# Patient Record
Sex: Male | Born: 1950 | Race: Black or African American | Hispanic: No | Smoking: Former smoker
Health system: Southern US, Community
[De-identification: ages and names within clinical notes are randomized; demographics above are authoritative.]

## PROBLEM LIST (undated history)

## (undated) DIAGNOSIS — Z923 Personal history of irradiation: Secondary | ICD-10-CM

## (undated) DIAGNOSIS — R202 Paresthesia of skin: Secondary | ICD-10-CM

## (undated) DIAGNOSIS — F1910 Other psychoactive substance abuse, uncomplicated: Secondary | ICD-10-CM

## (undated) DIAGNOSIS — K635 Polyp of colon: Secondary | ICD-10-CM

## (undated) DIAGNOSIS — R200 Anesthesia of skin: Secondary | ICD-10-CM

## (undated) DIAGNOSIS — M1990 Unspecified osteoarthritis, unspecified site: Secondary | ICD-10-CM

## (undated) DIAGNOSIS — T7840XA Allergy, unspecified, initial encounter: Secondary | ICD-10-CM

## (undated) DIAGNOSIS — K625 Hemorrhage of anus and rectum: Secondary | ICD-10-CM

## (undated) DIAGNOSIS — K219 Gastro-esophageal reflux disease without esophagitis: Secondary | ICD-10-CM

## (undated) DIAGNOSIS — C801 Malignant (primary) neoplasm, unspecified: Secondary | ICD-10-CM

## (undated) DIAGNOSIS — Z970 Presence of artificial eye: Secondary | ICD-10-CM

## (undated) HISTORY — DX: Polyp of colon: K63.5

## (undated) HISTORY — PX: EYE SURGERY: SHX253

## (undated) HISTORY — PX: COLONOSCOPY: SHX174

## (undated) HISTORY — DX: Paresthesia of skin: R20.2

## (undated) HISTORY — DX: Presence of artificial eye: Z97.0

## (undated) HISTORY — DX: Hemorrhage of anus and rectum: K62.5

## (undated) HISTORY — DX: Unspecified osteoarthritis, unspecified site: M19.90

## (undated) HISTORY — PX: COLONOSCOPY W/ POLYPECTOMY: SHX1380

## (undated) HISTORY — DX: Other psychoactive substance abuse, uncomplicated: F19.10

## (undated) HISTORY — DX: Anesthesia of skin: R20.0

## (undated) HISTORY — PX: POLYPECTOMY: SHX149

## (undated) HISTORY — DX: Gastro-esophageal reflux disease without esophagitis: K21.9

## (undated) HISTORY — DX: Allergy, unspecified, initial encounter: T78.40XA

---

## 2000-06-02 ENCOUNTER — Emergency Department (HOSPITAL_COMMUNITY): Admission: EM | Admit: 2000-06-02 | Discharge: 2000-06-02 | Payer: Self-pay | Admitting: Emergency Medicine

## 2001-03-28 ENCOUNTER — Emergency Department (HOSPITAL_COMMUNITY): Admission: EM | Admit: 2001-03-28 | Discharge: 2001-03-28 | Payer: Self-pay | Admitting: Emergency Medicine

## 2001-05-14 ENCOUNTER — Encounter: Admission: RE | Admit: 2001-05-14 | Discharge: 2001-05-14 | Payer: Self-pay | Admitting: Internal Medicine

## 2001-05-25 ENCOUNTER — Encounter (INDEPENDENT_AMBULATORY_CARE_PROVIDER_SITE_OTHER): Payer: Self-pay | Admitting: Internal Medicine

## 2001-06-15 ENCOUNTER — Encounter: Admission: RE | Admit: 2001-06-15 | Discharge: 2001-06-15 | Payer: Self-pay | Admitting: Internal Medicine

## 2003-05-23 ENCOUNTER — Encounter: Admission: RE | Admit: 2003-05-23 | Discharge: 2003-05-23 | Payer: Self-pay | Admitting: Internal Medicine

## 2003-05-28 ENCOUNTER — Encounter: Admission: RE | Admit: 2003-05-28 | Discharge: 2003-05-28 | Payer: Self-pay | Admitting: Internal Medicine

## 2003-07-10 ENCOUNTER — Encounter: Payer: Self-pay | Admitting: Gastroenterology

## 2003-08-13 ENCOUNTER — Encounter: Admission: RE | Admit: 2003-08-13 | Discharge: 2003-08-13 | Payer: Self-pay | Admitting: Internal Medicine

## 2004-12-30 ENCOUNTER — Ambulatory Visit: Payer: Self-pay | Admitting: Internal Medicine

## 2005-08-02 ENCOUNTER — Ambulatory Visit: Payer: Self-pay | Admitting: Internal Medicine

## 2005-08-04 ENCOUNTER — Ambulatory Visit: Payer: Self-pay | Admitting: Internal Medicine

## 2005-09-05 ENCOUNTER — Ambulatory Visit: Payer: Self-pay | Admitting: Gastroenterology

## 2005-09-07 ENCOUNTER — Ambulatory Visit: Payer: Self-pay | Admitting: Gastroenterology

## 2005-09-07 ENCOUNTER — Encounter (INDEPENDENT_AMBULATORY_CARE_PROVIDER_SITE_OTHER): Payer: Self-pay | Admitting: *Deleted

## 2006-03-13 DIAGNOSIS — F17200 Nicotine dependence, unspecified, uncomplicated: Secondary | ICD-10-CM

## 2006-03-13 DIAGNOSIS — K625 Hemorrhage of anus and rectum: Secondary | ICD-10-CM

## 2006-05-15 DIAGNOSIS — Z860100 Personal history of colon polyps, unspecified: Secondary | ICD-10-CM | POA: Insufficient documentation

## 2006-10-31 ENCOUNTER — Telehealth: Payer: Self-pay | Admitting: *Deleted

## 2006-11-02 ENCOUNTER — Ambulatory Visit: Payer: Self-pay | Admitting: Internal Medicine

## 2006-11-02 ENCOUNTER — Ambulatory Visit (HOSPITAL_COMMUNITY): Admission: RE | Admit: 2006-11-02 | Discharge: 2006-11-02 | Payer: Self-pay | Admitting: *Deleted

## 2006-11-21 ENCOUNTER — Telehealth (INDEPENDENT_AMBULATORY_CARE_PROVIDER_SITE_OTHER): Payer: Self-pay | Admitting: Internal Medicine

## 2008-02-25 ENCOUNTER — Ambulatory Visit: Payer: Self-pay | Admitting: Internal Medicine

## 2008-02-27 ENCOUNTER — Encounter (INDEPENDENT_AMBULATORY_CARE_PROVIDER_SITE_OTHER): Payer: Self-pay | Admitting: Internal Medicine

## 2008-02-27 LAB — CONVERTED CEMR LAB
ALT: 26 units/L (ref 0–53)
AST: 25 units/L (ref 0–37)
Albumin: 4.3 g/dL (ref 3.5–5.2)
Alkaline Phosphatase: 60 units/L (ref 39–117)
BUN: 9 mg/dL (ref 6–23)
Basophils Absolute: 0 10*3/uL (ref 0.0–0.1)
Basophils Relative: 1 % (ref 0–1)
CO2: 25 meq/L (ref 19–32)
Calcium: 8.9 mg/dL (ref 8.4–10.5)
Chloride: 105 meq/L (ref 96–112)
Cholesterol: 111 mg/dL (ref 0–200)
Creatinine, Ser: 0.89 mg/dL (ref 0.40–1.50)
Eosinophils Absolute: 0.1 10*3/uL (ref 0.0–0.7)
Eosinophils Relative: 2 % (ref 0–5)
Glucose, Bld: 99 mg/dL (ref 70–99)
HCT: 43.8 % (ref 39.0–52.0)
HDL: 34 mg/dL — ABNORMAL LOW (ref >39)
Hemoglobin: 14.5 g/dL (ref 13.0–17.0)
LDL Cholesterol: 36 mg/dL (ref 0–99)
Lymphocytes Relative: 52 % — ABNORMAL HIGH (ref 12–46)
Lymphs Abs: 1.9 10*3/uL (ref 0.7–4.0)
MCHC: 33.1 g/dL (ref 30.0–36.0)
MCV: 96.9 fL (ref 78.0–100.0)
Monocytes Absolute: 0.4 10*3/uL (ref 0.1–1.0)
Monocytes Relative: 11 % (ref 3–12)
Neutro Abs: 1.2 10*3/uL — ABNORMAL LOW (ref 1.7–7.7)
Neutrophils Relative %: 34 % — ABNORMAL LOW (ref 43–77)
Platelets: 226 10*3/uL (ref 150–400)
Potassium: 4.1 meq/L (ref 3.5–5.3)
RBC: 4.52 M/uL (ref 4.22–5.81)
RDW: 13 % (ref 11.5–15.5)
Sodium: 139 meq/L (ref 135–145)
TSH: 1.164 microintl units/mL (ref 0.350–4.50)
Total Bilirubin: 0.9 mg/dL (ref 0.3–1.2)
Total CHOL/HDL Ratio: 3.3
Total Protein: 7.1 g/dL (ref 6.0–8.3)
Triglycerides: 203 mg/dL — ABNORMAL HIGH (ref <150)
VLDL: 41 mg/dL — ABNORMAL HIGH (ref 0–40)
Vitamin B-12: 332 pg/mL (ref 211–911)
WBC: 3.6 10*3/uL — ABNORMAL LOW (ref 4.0–10.5)

## 2008-03-14 ENCOUNTER — Telehealth: Payer: Self-pay | Admitting: *Deleted

## 2008-03-18 ENCOUNTER — Ambulatory Visit: Payer: Self-pay | Admitting: Internal Medicine

## 2008-03-18 LAB — CONVERTED CEMR LAB
OCCULT 1: POSITIVE
OCCULT 2: NEGATIVE
OCCULT 3: NEGATIVE

## 2008-04-03 ENCOUNTER — Ambulatory Visit: Payer: Self-pay | Admitting: Gastroenterology

## 2008-05-09 ENCOUNTER — Ambulatory Visit: Payer: Self-pay | Admitting: Gastroenterology

## 2008-06-18 ENCOUNTER — Ambulatory Visit: Payer: Self-pay | Admitting: Gastroenterology

## 2008-07-04 ENCOUNTER — Ambulatory Visit: Payer: Self-pay | Admitting: Gastroenterology

## 2008-07-04 ENCOUNTER — Encounter: Payer: Self-pay | Admitting: Gastroenterology

## 2008-07-04 LAB — HM COLONOSCOPY

## 2008-07-15 ENCOUNTER — Encounter: Payer: Self-pay | Admitting: Gastroenterology

## 2009-08-11 ENCOUNTER — Ambulatory Visit: Payer: Self-pay | Admitting: Internal Medicine

## 2009-08-11 DIAGNOSIS — J329 Chronic sinusitis, unspecified: Secondary | ICD-10-CM | POA: Insufficient documentation

## 2009-08-11 LAB — CONVERTED CEMR LAB
ALT: 21 units/L (ref 0–53)
AST: 23 units/L (ref 0–37)
Albumin: 4.4 g/dL (ref 3.5–5.2)
Alkaline Phosphatase: 72 units/L (ref 39–117)
BUN: 7 mg/dL (ref 6–23)
CO2: 28 meq/L (ref 19–32)
Calcium: 9.5 mg/dL (ref 8.4–10.5)
Chloride: 106 meq/L (ref 96–112)
Creatinine, Ser: 0.8 mg/dL (ref 0.40–1.50)
Glucose, Bld: 93 mg/dL (ref 70–99)
HCT: 42 % (ref 39.0–52.0)
Hemoglobin: 14.1 g/dL (ref 13.0–17.0)
MCHC: 33.6 g/dL (ref 30.0–36.0)
MCV: 95.9 fL (ref >=78.0)
PSA: 0.45 ng/mL (ref 0.10–4.00)
Platelets: 249 10*3/uL (ref 150–400)
Potassium: 4.3 meq/L (ref 3.5–5.3)
RBC: 4.38 M/uL (ref 4.22–5.81)
RDW: 13.2 % (ref 11.5–15.5)
Sodium: 141 meq/L (ref 135–145)
Total Bilirubin: 0.4 mg/dL (ref 0.3–1.2)
Total Protein: 7 g/dL (ref 6.0–8.3)
WBC: 5.1 10*3/uL (ref 4.0–10.5)

## 2009-08-12 ENCOUNTER — Telehealth: Payer: Self-pay | Admitting: *Deleted

## 2009-08-18 ENCOUNTER — Ambulatory Visit (HOSPITAL_COMMUNITY): Admission: RE | Admit: 2009-08-18 | Discharge: 2009-08-18 | Payer: Self-pay | Admitting: Internal Medicine

## 2009-09-04 ENCOUNTER — Ambulatory Visit: Payer: Self-pay | Admitting: Infectious Disease

## 2009-09-07 ENCOUNTER — Encounter: Payer: Self-pay | Admitting: Internal Medicine

## 2009-09-16 ENCOUNTER — Encounter: Payer: Self-pay | Admitting: Internal Medicine

## 2009-09-25 ENCOUNTER — Ambulatory Visit: Payer: Self-pay | Admitting: Infectious Disease

## 2009-09-25 DIAGNOSIS — R030 Elevated blood-pressure reading, without diagnosis of hypertension: Secondary | ICD-10-CM

## 2010-05-07 ENCOUNTER — Ambulatory Visit: Admission: RE | Admit: 2010-05-07 | Discharge: 2010-05-07 | Payer: Self-pay | Source: Home / Self Care

## 2010-05-07 DIAGNOSIS — J309 Allergic rhinitis, unspecified: Secondary | ICD-10-CM | POA: Insufficient documentation

## 2010-05-08 LAB — CONVERTED CEMR LAB
LDL Cholesterol: 56 mg/dL (ref 0–99)
Total CHOL/HDL Ratio: 3

## 2010-06-01 NOTE — Progress Notes (Signed)
  Phone Note Outgoing Call   Call placed by: Theotis Barrio NT II,  August 12, 2009 3:07 PM Call placed to: Patient Details for Reason: TO INFORM PATIENT ABOUT HAVING CXR Summary of Call: CALLED PATIENT AT [phone], THIS IS NOT A CORRECT PHONE NUMBER, THIS A RESTARAUNT. Marland Kitchen UNABLE TO CONTACT PATIENT BY THIS PHONE NUMBER. Lela Sturdivant NT II  August 12, 2009 3:09 PM
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
## 2010-06-01 NOTE — Miscellaneous (Signed)
Summary: [facility]   Imported By: Margie Billet 10/05/2009 11:47:32  _____________________________________________________________________  External Attachment:    Type:   Image     Comment:   External Document

## 2010-06-01 NOTE — Progress Notes (Signed)
  Phone Note Outgoing Call   Call placed by: Theotis Barrio NT II,  August 12, 2009 4:22 PM Call placed to: Patient Details for Reason: CHEST X-RAY REFERRAL Summary of Call: CALLED MR Yeates AT [phone]([facility] HAD THIS NUMBER IN HIS NOTES) CALLED PATIENT , NO ANSWER.  PUTTING XRAY ORDERS IN THE MAIL / BUT I HAVE ORGINAL ORDER. Delores Thelen NT II  August 12, 2009 4:23 PM

## 2010-06-01 NOTE — Assessment & Plan Note (Signed)
Summary: FU VISIT/DS   Vital Signs:  Patient profile:   60 year old male Height:      66 inches (167.64 cm) Weight:      138.7 pounds (63.05 kg) BMI:     22.47 Temp:     97.5 degrees F (36.39 degrees C) oral Pulse rate:   73 / minute BP sitting:   146 / 83  (right arm)  Vitals Entered By: Stanton Kidney Ditzler RN (Sep 25, 2009 3:43 PM) CC: Depression Is Patient Diabetic? No Pain Assessment Patient in pain? no      Nutritional Status BMI of 19 -24 = normal Nutritional Status Detail appetite good  Have you ever been in a relationship where you felt threatened, hurt or afraid?denies   Does patient need assistance? Functional Status Self care Ambulation Normal Comments FU - better.   Primary Care Provider:  Chauncey Reading DO  CC:  Depression.  History of Present Illness: 58yom with pmh outlined in the chart who is here for follow up.  He has no complaints today.  His cough is improved.  He still has a little scratchyness in the back of the throat, but it is also improved.  No fevers or chills.  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  Depression History:      The patient denies a depressed mood most of the day and a diminished interest in his usual daily activities.         Preventive Screening-Counseling & Management  Alcohol-Tobacco     Smoking Status: quit     Packs/Day: <1/2     Year Quit:    2008  Caffeine-Diet-Exercise     Caffeine use/day: 4     Does Patient Exercise: yes     Type of exercise:   BIKE      Times/week:   5  Current Medications (verified): 1)  Flonase 50 Mcg/act Susp (Fluticasone Propionate) .... One Puff in Each Nostril. 2)  Ventolin Hfa 108 (90 Base) Mcg/act Aers (Albuterol Sulfate) .... Inhale 2 Puff Every 4 Hour As Needed For Shortness of Breath and Cough. 3)  Qvar 40 Mcg/act Aers (Beclomethasone Dipropionate)  .Marland Kitchen.. 1 Puff Twice A Day.  Allergies (verified): No Known Drug Allergies  Review of Systems       per hpi  Physical Exam  General:  alert and well-developed.   Mouth:  pharynx pink and moist.   Lungs:  normal respiratory effort and normal breath sounds.  there is a fine wheeze at end inspiration. Heart:  normal rate, regular rhythm, no murmur, and no gallop.   Extremities:  no edema Neurologic:  alert & oriented X3, cranial nerves II-XII intact, and gait normal.     Impression & Recommendations:  Problem # 1:  COUGH (ICD-786.2) Improved with prednisone taper, will see allergist again next week.  Trying to move out of his appartment that has mold and mildew.  Problem # 2:  HYPERTRIGLYCERIDEMIA (ICD-272.1) repeat this test at next appointment.  Labs Reviewed: SGOT: 23 (08/11/2009)   SGPT: 21 (08/11/2009)   HDL:34 (02/25/2008)  LDL:36 (02/25/2008)  Chol:111 (02/25/2008)  Trig:203 (02/25/2008)  Problem # 3:  TOBACCO ABUSE (ICD-305.1) has quit smoking.  Problem # 4:  GERD (ICD-530.81) no associated complaints today.  Problem # 5:  ELEVATED BLOOD PRESSURE WITHOUT DIAGNOSIS OF HYPERTENSION (ICD-796.2) has  had two measurements >140. he will rtc in 3 months for recheck.  If at that time stil >140 than would give dx HTN and treat. Today discussed salt reduction and exercise.  He will try these and see if he can bring BP down.  Complete Medication List: 1)  Flonase 50 Mcg/act Susp (Fluticasone propionate) .... One puff in each nostril. 2)  Ventolin Hfa 108 (90 Base) Mcg/act Aers (Albuterol sulfate) .... Inhale 2 puff every 4 hour as needed for shortness of breath and cough. 3)  Qvar 40 Mcg/act Aers (Beclomethasone dipropionate) .Marland Kitchen.. 1 puff twice a day.  Patient Instructions: 1)  Please schedule a follow-up appointment in 3 months. 2)  You should exercise and cut down on the salt in your diet to decrease your blood pressure.   Prevention & Chronic Care Immunizations    Influenza vaccine: Fluvax 3+  (02/25/2008)   Influenza vaccine deferral: Deferred  (08/11/2009)    Tetanus booster: Not documented    Pneumococcal vaccine: Not documented  Colorectal Screening   Hemoccult: Not documented    Colonoscopy: Results: Polyp.  Pathology:  Adenomatous polyp.        Location:  [facility].    (07/04/2008)   Colonoscopy action/deferral: Repeat colonoscopy in 3 years.     (07/04/2008)   Colonoscopy due: 07/2011  Other Screening   PSA: 0.45  (08/11/2009)   PSA action/deferral: Discussed-PSA requested  (08/11/2009)   Smoking status: quit  (09/25/2009)  Lipids   Total Cholesterol: 111  (02/25/2008)   LDL: 36  (02/25/2008)   LDL Direct: Not documented   HDL: 34  (02/25/2008)   Triglycerides: 203  (02/25/2008)    SGOT (AST): 23  (08/11/2009)   SGPT (ALT): 21  (08/11/2009)   Alkaline phosphatase: 72  (08/11/2009)   Total bilirubin: 0.4  (08/11/2009)    Lipid flowsheet reviewed?: Yes   Progress toward LDL goal: Unchanged  Self-Management Support :   Personal Goals (by the next clinic visit) :      Personal LDL goal: 100  (08/11/2009)    Lipid self-management support: Written self-care plan, Education handout, Resources for patients handout  (09/04/2009)

## 2010-06-01 NOTE — Letter (Signed)
Summary: [facility]  [facility]   Imported By: Margie Billet 09/17/2009 11:02:14  _____________________________________________________________________  External Attachment:    Type:   Image     Comment:   External Document
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
## 2010-07-30 ENCOUNTER — Encounter: Payer: Self-pay | Admitting: Internal Medicine

## 2010-10-08 ENCOUNTER — Encounter: Payer: Self-pay | Admitting: Internal Medicine

## 2010-10-08 ENCOUNTER — Ambulatory Visit (INDEPENDENT_AMBULATORY_CARE_PROVIDER_SITE_OTHER): Payer: Medicare Other | Admitting: Internal Medicine

## 2010-10-08 DIAGNOSIS — N400 Enlarged prostate without lower urinary tract symptoms: Secondary | ICD-10-CM

## 2010-10-08 DIAGNOSIS — J309 Allergic rhinitis, unspecified: Secondary | ICD-10-CM

## 2010-10-08 DIAGNOSIS — Z125 Encounter for screening for malignant neoplasm of prostate: Secondary | ICD-10-CM

## 2010-10-08 DIAGNOSIS — R030 Elevated blood-pressure reading, without diagnosis of hypertension: Secondary | ICD-10-CM

## 2010-10-08 DIAGNOSIS — F329 Major depressive disorder, single episode, unspecified: Secondary | ICD-10-CM | POA: Insufficient documentation

## 2010-10-08 NOTE — Progress Notes (Signed)
  Subjective:    Patient ID: Raymond Gregory, male    DOB: 03-Jul-1950, 60 y.o.   MRN: [id number]  HPI [name] is here for follow up. He has no particular complain. He does endorse depression. When asked to eloborate, he tells me he kind of feels sad, likes to lay in bed. His wife with whom he had separated wants to join him after 3-4 years of on and off relationship. He does not have particular stressor. He likes to watch basketball but does not do anything else. He denies suicidal ideations. He eats fine. And has not lost any weight.    Review of Systems  Constitutional: Negative for fever, chills, activity change and appetite change.  HENT: Negative for nosebleeds, facial swelling, neck pain and tinnitus.   Eyes: Negative for pain, discharge and visual disturbance.  Respiratory: Negative for cough, chest tightness and shortness of breath.   Cardiovascular: Negative for chest pain and palpitations.  Gastrointestinal: Negative for nausea, vomiting, abdominal pain, blood in stool and abdominal distention.  Skin: Negative for rash.  Neurological: Negative for dizziness, seizures, weakness and headaches.  Psychiatric/Behavioral: Negative for suicidal ideas, confusion and agitation.       Objective:   Physical Exam  Constitutional: He is oriented to person, place, and time. He appears well-developed and well-nourished.  HENT:  Head: Normocephalic and atraumatic.  Right Ear: External ear normal.  Left Ear: External ear normal.  Eyes: Conjunctivae and EOM are normal. Pupils are equal, round, and reactive to light. Right eye exhibits no discharge. Left eye exhibits no discharge.  Neck: Normal range of motion. Neck supple. No thyromegaly present.  Cardiovascular: Normal rate and regular rhythm.   No murmur heard. Pulmonary/Chest: Effort normal and breath sounds normal. No respiratory distress. He has no wheezes. He has no rales.  Abdominal: Soft. Bowel sounds are normal. He exhibits no  distension and no mass. There is no tenderness. There is no rebound and no guarding.  Musculoskeletal: Normal range of motion.  Neurological: He is alert and oriented to person, place, and time. He has normal reflexes. No cranial nerve deficit. Coordination normal.  Skin: No rash noted. He is not diaphoretic. No erythema.  Psychiatric: He has a normal mood and affect. His behavior is normal. Judgment and thought content normal.          Assessment & Plan:

## 2010-10-08 NOTE — Patient Instructions (Signed)
Return in 3 months.

## 2010-10-08 NOTE — Assessment & Plan Note (Signed)
Minimal symptoms, patient is concerned for prostate cancer. Will screen with PSA. DRE not impressive.

## 2010-10-08 NOTE — Assessment & Plan Note (Signed)
Controlled symptoms

## 2010-10-08 NOTE — Assessment & Plan Note (Signed)
Encouraged daily physical activity, socialization and hobby development. He is thinking of joining the [employer]. He does not want medication right now.

## 2010-10-08 NOTE — Assessment & Plan Note (Signed)
BP normal today. He has stayed borderline high but does not have any other risk factors. He is not obese, no diabetes or cardiac history. He does smoke. At this time, will not start any therapy.

## 2010-10-09 LAB — PSA: PSA: 0.48 ng/mL (ref <=4.00)

## 2011-03-07 ENCOUNTER — Encounter: Payer: Self-pay | Admitting: Internal Medicine

## 2011-03-07 ENCOUNTER — Ambulatory Visit (INDEPENDENT_AMBULATORY_CARE_PROVIDER_SITE_OTHER): Payer: Medicare Other | Admitting: Internal Medicine

## 2011-03-07 VITALS — BP 139/80 | HR 70 | Temp 98.3°F | Ht 66.0 in | Wt 140.9 lb

## 2011-03-07 DIAGNOSIS — Z23 Encounter for immunization: Secondary | ICD-10-CM

## 2011-03-07 DIAGNOSIS — F17200 Nicotine dependence, unspecified, uncomplicated: Secondary | ICD-10-CM

## 2011-03-07 DIAGNOSIS — Z0000 Encounter for general adult medical examination without abnormal findings: Secondary | ICD-10-CM | POA: Insufficient documentation

## 2011-03-07 DIAGNOSIS — Z86010 Personal history of colonic polyps: Secondary | ICD-10-CM

## 2011-03-07 DIAGNOSIS — J309 Allergic rhinitis, unspecified: Secondary | ICD-10-CM

## 2011-03-07 DIAGNOSIS — F329 Major depressive disorder, single episode, unspecified: Secondary | ICD-10-CM

## 2011-03-07 DIAGNOSIS — R030 Elevated blood-pressure reading, without diagnosis of hypertension: Secondary | ICD-10-CM

## 2011-03-07 MED ORDER — LORATADINE 10 MG PO TABS: 10.0000 mg | ORAL_TABLET | Freq: Every day | ORAL | Status: DC | PRN

## 2011-03-07 NOTE — Assessment & Plan Note (Signed)
BP just barely within normal range today.  Will continue to monitor.

## 2011-03-07 NOTE — Assessment & Plan Note (Signed)
Patient not willing to commit to quitting entirely at this time.  I explained that we would be very happy to help him when he is ready, and instructed him to call clinic if he decides he wants help at a later date.

## 2011-03-07 NOTE — Assessment & Plan Note (Signed)
Patient enjoys walking his dog and other things in life.  Says that his mood has generally been very good.

## 2011-03-07 NOTE — Patient Instructions (Signed)
Please return to clinic in 1 year

## 2011-03-07 NOTE — Assessment & Plan Note (Signed)
Referred him to GI for colonoscopy to take place in 6 months (when it would come due)

## 2011-03-07 NOTE — Assessment & Plan Note (Signed)
Explained that Flonase is not meant to be used PRN.  He does not seem to need a maintenance medicine at this time.  Discontinued Flonase and prescribed Claritin daily PRN for allergic rhinitis.

## 2011-03-07 NOTE — Assessment & Plan Note (Addendum)
Flu shot to be given today.  Patient to follow-up in 1 year for a check-up.  Instructed to call us if he wants to come in sooner.
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
## 2011-05-27 ENCOUNTER — Encounter: Payer: Self-pay | Admitting: Gastroenterology

## 2011-06-20 ENCOUNTER — Encounter: Payer: Self-pay | Admitting: Gastroenterology

## 2011-06-27 ENCOUNTER — Ambulatory Visit (AMBULATORY_SURGERY_CENTER): Payer: Medicare Other | Admitting: *Deleted

## 2011-06-27 DIAGNOSIS — Z86010 Personal history of colonic polyps: Secondary | ICD-10-CM

## 2011-06-27 DIAGNOSIS — Z1211 Encounter for screening for malignant neoplasm of colon: Secondary | ICD-10-CM

## 2011-06-27 MED ORDER — PEG-KCL-NACL-NASULF-NA ASC-C 100 G PO SOLR: ORAL | Status: DC

## 2011-06-27 NOTE — Progress Notes (Signed)
No allergy to eggs or soy products 

## 2011-07-11 ENCOUNTER — Encounter: Payer: Self-pay | Admitting: Gastroenterology

## 2011-07-11 ENCOUNTER — Ambulatory Visit (AMBULATORY_SURGERY_CENTER): Payer: Medicare Other | Admitting: Gastroenterology

## 2011-07-11 ENCOUNTER — Telehealth: Payer: Self-pay | Admitting: Gastroenterology

## 2011-07-11 ENCOUNTER — Other Ambulatory Visit: Payer: Self-pay | Admitting: Gastroenterology

## 2011-07-11 VITALS — BP 135/91 | HR 90 | Temp 95.5°F | Resp 19 | Ht 65.0 in | Wt 143.0 lb

## 2011-07-11 DIAGNOSIS — Z1211 Encounter for screening for malignant neoplasm of colon: Secondary | ICD-10-CM

## 2011-07-11 DIAGNOSIS — Z86010 Personal history of colonic polyps: Secondary | ICD-10-CM

## 2011-07-11 DIAGNOSIS — D126 Benign neoplasm of colon, unspecified: Secondary | ICD-10-CM

## 2011-07-11 MED ORDER — SODIUM CHLORIDE 0.9 % IV SOLN: 500.0000 mL | INTRAVENOUS | Status: DC

## 2011-07-11 NOTE — Patient Instructions (Signed)
YOU HAD AN ENDOSCOPIC PROCEDURE TODAY AT THE [facility]: Refer to the procedure report that was given to you for any specific questions about what was found during the examination.  If the procedure report does not answer your questions, please call your gastroenterologist to clarify.  If you requested that your care partner not be given the details of your procedure findings, then the procedure report has been included in a sealed envelope for you to review at your convenience later.  YOU SHOULD EXPECT: Some feelings of bloating in the abdomen. Passage of more gas than usual.  Walking can help get rid of the air that was put into your GI tract during the procedure and reduce the bloating. If you had a lower endoscopy (such as a colonoscopy or flexible sigmoidoscopy) you may notice spotting of blood in your stool or on the toilet paper. If you underwent a bowel prep for your procedure, then you may not have a normal bowel movement for a few days.  DIET: Your first meal following the procedure should be a light meal and then it is ok to progress to your normal diet.  A half-sandwich or bowl of soup is an example of a good first meal.  Heavy or fried foods are harder to digest and may make you feel nauseous or bloated.  Likewise meals heavy in dairy and vegetables can cause extra gas to form and this can also increase the bloating.  Drink plenty of fluids but you should avoid alcoholic beverages for 24 hours.  ACTIVITY: Your care partner should take you home directly after the procedure.  You should plan to take it easy, moving slowly for the rest of the day.  You can resume normal activity the day after the procedure however you should NOT DRIVE or use heavy machinery for 24 hours (because of the sedation medicines used during the test).    SYMPTOMS TO REPORT IMMEDIATELY: A gastroenterologist can be reached at any hour.  During normal business hours, 8:30 AM to 5:00 PM Monday through Friday,  call [phone].  After hours and on weekends, please call the GI answering service at [phone] who will take a message and have the physician on call contact you.   Following lower endoscopy (colonoscopy or flexible sigmoidoscopy):  Excessive amounts of blood in the stool  Significant tenderness or worsening of abdominal pains  Swelling of the abdomen that is new, acute  Fever of 100F or higher  Black, tarry-looking stools  FOLLOW UP: If any biopsies were taken you will be contacted by phone or by letter within the next 1-3 weeks.  Call your gastroenterologist if you have not heard about the biopsies in 3 weeks.  Our staff will call the home number listed on your records the next business day following your procedure to check on you and address any questions or concerns that you may have at that time regarding the information given to you following your procedure. This is a courtesy call and so if there is no answer at the home number and we have not heard from you through the emergency physician on call, we will assume that you have returned to your regular daily activities without incident.  SIGNATURES/CONFIDENTIALITY: You and/or your care partner have signed paperwork which will be entered into your electronic medical record.  These signatures attest to the fact that that the information above on your After Visit Summary has been reviewed and is understood.  Full responsibility of   the confidentiality of this discharge information lies with you and/or your care-partner.  

## 2011-07-11 NOTE — Telephone Encounter (Signed)
Patient called and said he did not have his packet B with his second dose.  Asked Coralie Keens, RN what he should do and she asked Dr. Arlyce Dice.  He advised patient to go purchase a bottle of Mag. Citrate over the counter and drink it with plenty of water.  Patient was advised and agreed to do so.

## 2011-07-11 NOTE — Progress Notes (Signed)
Patient did not experience any of the following events: a burn prior to discharge; a fall within the facility; wrong site/side/patient/procedure/implant event; or a hospital transfer or hospital admission upon discharge from the facility. (G8907) Patient did not have preoperative order for IV antibiotic SSI prophylaxis. (G8918)  

## 2011-07-11 NOTE — Op Note (Signed)
[facility] [address] [place], [place]  [address]  COLONOSCOPY PROCEDURE REPORT  PATIENT:  Raymond Gregory, Raymond Gregory  MR#:  [id number] BIRTHDATE:  [birth year], 60 yrs. old  GENDER:  male ENDOSCOPIST:  Barbette Hair. Arlyce Dice, MD REF. BY: PROCEDURE DATE:  07/11/2011 PROCEDURE:  Colonoscopy with snare polypectomy ASA CLASS:  Class II INDICATIONS:  Screening, history of pre-cancerous (adenomatous) colon polyps colon polyps 2005, 2010 MEDICATIONS:   MAC sedation, administered by CRNA propofol 250mg IV  DESCRIPTION OF PROCEDURE:   After the risks benefits and alternatives of the procedure were thoroughly explained, informed consent was obtained.  Digital rectal exam was performed and revealed no abnormalities.   The LB 180AL [id number] endoscope was introduced through the anus and advanced to the cecum, which was identified by both the appendix and ileocecal valve, without limitations.  The quality of the prep was excellent, using MoviPrep.  The instrument was then slowly withdrawn as the colon was fully examined. <<PROCEDUREIMAGES>>  FINDINGS:  A sessile polyp was found at the hepatic flexure. It was 3 mm in size. Polyp was snared without cautery. Retrieval was unsuccessful (see image4). snare polyp  A sessile polyp was found in the mid transverse colon. It was 5 mm in size. Polyp was snared without cautery. Retrieval was successful (see image7). snare polyp  A sessile polyp was found in the proximal transverse colon. It was 3 mm in size. Polyp was snared without cautery. Retrieval was successful (see image5). snare polyp   Retroflexed views in the rectum revealed no abnormalities.    The time to cecum =  1) 4.0  minutes. The scope was then withdrawn in  1) 11.50  minutes from the cecum and the procedure completed. COMPLICATIONS:  None ENDOSCOPIC IMPRESSION: 1) 3 mm sessile polyp at the hepatic flexure 2) 5 mm sessile polyp in the mid transverse colon 3) 3 mm sessile polyp in the proximal  transverse colon RECOMMENDATIONS: 1) If the polyp(s) removed today are proven to be adenomatous (pre-cancerous) polyps, you will need a repeat colonoscopy in 5 years. Otherwise you should continue to follow colorectal cancer screening guidelines for "routine risk" patients with colonoscopy in 10 years. You will receive a letter within 1-2 weeks with the results of your biopsy as well as final recommendations. Please call my office if you have not received a letter after 3 weeks. REPEAT EXAM:  You will receive a letter from Dr. Arlyce Dice in 1-2 weeks, after reviewing the final pathology, with followup recommendations.  ______________________________ Barbette Hair Arlyce Dice, MD  CC:  Thomas Hoff MD  n. Rosalie DoctorBarbette Hair. Maraya Gwilliam at 07/11/2011 03:35 PM  Elliot Cousin, [phone]

## 2011-07-12 ENCOUNTER — Telehealth: Payer: Self-pay | Admitting: *Deleted

## 2011-07-12 NOTE — Telephone Encounter (Signed)
  Follow up Call-  Call back number 07/11/2011  Post procedure Call Back phone  # [phone]  Permission to leave phone message Yes     Patient questions:  Do you have a fever, pain , or abdominal swelling? no Pain Score  0 *  Have you tolerated food without any problems? yes  Have you been able to return to your normal activities? yes  Do you have any questions about your discharge instructions: Diet   no Medications  no Follow up visit  no  Do you have questions or concerns about your Care? no  Actions: * If pain score is 4 or above: No action needed, pain <4.

## 2011-07-19 ENCOUNTER — Encounter: Payer: Self-pay | Admitting: Gastroenterology

## 2012-11-22 ENCOUNTER — Ambulatory Visit (HOSPITAL_COMMUNITY)
Admission: RE | Admit: 2012-11-22 | Discharge: 2012-11-22 | Disposition: A | Discharge status: Home / Self Care | Payer: Medicare Other | Source: Ambulatory Visit | Attending: Internal Medicine | Admitting: Internal Medicine

## 2012-11-22 ENCOUNTER — Encounter: Payer: Medicare Other | Admitting: Internal Medicine

## 2012-11-22 ENCOUNTER — Ambulatory Visit (INDEPENDENT_AMBULATORY_CARE_PROVIDER_SITE_OTHER): Payer: Medicare Other | Admitting: Internal Medicine

## 2012-11-22 ENCOUNTER — Encounter: Payer: Self-pay | Admitting: Internal Medicine

## 2012-11-22 VITALS — BP 151/100 | HR 72 | Temp 97.8°F | Ht 66.0 in | Wt 139.6 lb

## 2012-11-22 DIAGNOSIS — R631 Polydipsia: Secondary | ICD-10-CM

## 2012-11-22 DIAGNOSIS — R531 Weakness: Secondary | ICD-10-CM

## 2012-11-22 DIAGNOSIS — R55 Syncope and collapse: Secondary | ICD-10-CM | POA: Insufficient documentation

## 2012-11-22 DIAGNOSIS — R358 Other polyuria: Secondary | ICD-10-CM

## 2012-11-22 DIAGNOSIS — R030 Elevated blood-pressure reading, without diagnosis of hypertension: Secondary | ICD-10-CM

## 2012-11-22 DIAGNOSIS — I517 Cardiomegaly: Secondary | ICD-10-CM | POA: Insufficient documentation

## 2012-11-22 DIAGNOSIS — R5383 Other fatigue: Secondary | ICD-10-CM

## 2012-11-22 LAB — COMPLETE METABOLIC PANEL WITH GFR
ALT: 19 U/L (ref 0–53)
AST: 29 U/L (ref 0–37)
Alkaline Phosphatase: 129 U/L — ABNORMAL HIGH (ref 39–117)
CO2: 28 mEq/L (ref 19–32)
Creat: 0.69 mg/dL (ref 0.50–1.35)
Sodium: 139 mEq/L (ref 135–145)
Total Bilirubin: 0.6 mg/dL (ref 0.3–1.2)
Total Protein: 8.1 g/dL (ref 6.0–8.3)

## 2012-11-22 LAB — CBC WITH DIFFERENTIAL/PLATELET
Basophils Relative: 1 % (ref 0–1)
Eosinophils Absolute: 0.2 10*3/uL (ref 0.0–0.7)
Eosinophils Relative: 4 % (ref 0–5)
HCT: 45.1 % (ref 39.0–52.0)
Hemoglobin: 15.1 g/dL (ref 13.0–17.0)
Lymphs Abs: 2.3 10*3/uL (ref 0.7–4.0)
MCH: 32.1 pg (ref 26.0–34.0)
MCHC: 33.5 g/dL (ref 30.0–36.0)
MCV: 95.8 fL (ref 78.0–100.0)
Monocytes Absolute: 0.5 10*3/uL (ref 0.1–1.0)
Monocytes Relative: 9 % (ref 3–12)
Neutrophils Relative %: 43 % (ref 43–77)
RBC: 4.71 MIL/uL (ref 4.22–5.81)

## 2012-11-22 LAB — URINALYSIS, ROUTINE W REFLEX MICROSCOPIC
Glucose, UA: NEGATIVE mg/dL
Hgb urine dipstick: NEGATIVE
Leukocytes, UA: NEGATIVE
pH: 5.5 (ref 5.0–8.0)

## 2012-11-22 LAB — GLUCOSE, CAPILLARY: Glucose-Capillary: 98 mg/dL (ref 70–99)

## 2012-11-22 LAB — TSH: TSH: 1.036 u[IU]/mL (ref 0.350–4.500)

## 2012-11-22 NOTE — Assessment & Plan Note (Addendum)
BP Readings from Last 3 Encounters:  11/22/12 151/100  07/11/11 135/91  03/07/11 139/80    Lab Results  Component Value Date   NA 139 11/22/2012   K 4.2 11/22/2012   CREATININE 0.69 11/22/2012    Assessment: Blood pressure control:  <140/90 Comments: The patient likely has HTN.  Plan: Medications:  We elected to not start a BP medication today as he had recently had an episode of syncope that may have resulted from dehydration/orthostasis. Educational resources provided:   Self management tools provided:   Other plans: The patient is creating a two week BP log that he will bring to his next visit. At this time, a decision may be made regarding starting a BP medication.

## 2012-11-22 NOTE — Patient Instructions (Addendum)
Create Blood pressure and heart rate log (Note any times that you are dizzy)\  Complete ultrasound of the heart  Follow-up in 2 weeks with clinic to re-evaluate and consider starting BP medications.

## 2012-11-22 NOTE — Assessment & Plan Note (Addendum)
CBC    Component Value Date/Time   WBC 5.5 11/22/2012 1147   RBC 4.71 11/22/2012 1147   HGB 15.1 11/22/2012 1147   HCT 45.1 11/22/2012 1147   PLT 282 11/22/2012 1147   MCV 95.8 11/22/2012 1147   MCH 32.1 11/22/2012 1147   MCHC 33.5 11/22/2012 1147   RDW 13.3 11/22/2012 1147   LYMPHSABS 2.3 11/22/2012 1147   MONOABS 0.5 11/22/2012 1147   EOSABS 0.2 11/22/2012 1147   BASOSABS 0.1 11/22/2012 1147   CMP     Component Value Date/Time   NA 139 11/22/2012 1147   K 4.2 11/22/2012 1147   CL 101 11/22/2012 1147   CO2 28 11/22/2012 1147   GLUCOSE 95 11/22/2012 1147   BUN 10 11/22/2012 1147   CREATININE 0.69 11/22/2012 1147   CREATININE 0.80 08/11/2009 2038   CALCIUM 9.9 11/22/2012 1147   PROT 8.1 11/22/2012 1147   ALBUMIN 3.7 11/22/2012 1147   AST 29 11/22/2012 1147   ALT 19 11/22/2012 1147   ALKPHOS 129* 11/22/2012 1147   BILITOT 0.6 11/22/2012 1147    Urinalysis    Component Value Date/Time   COLORURINE YELLOW 11/22/2012 1154   APPEARANCEUR CLOUDY* 11/22/2012 1154   LABSPEC 1.027 11/22/2012 1154   PHURINE 5.5 11/22/2012 1154   GLUCOSEU NEG 11/22/2012 1154   HGBUR NEG 11/22/2012 1154   BILIRUBINUR SMALL* 11/22/2012 1154   KETONESUR NEG 11/22/2012 1154   PROTEINUR NEG 11/22/2012 1154   UROBILINOGEN 1 11/22/2012 1154   NITRITE NEG 11/22/2012 1154   LEUKOCYTESUR NEG 11/22/2012 1154   EKG: normal EKG, sinus bradycardia.  Assessment:  The patient's 1 episode of syncope that occurred 1 month ago is of an unknown cause at this time. It is possibility secondary to dehydration and resulting orthostasis that has since improved with oral rehydration.  Plan:  Today, we performed an EKG that demonstrated sinus bradycardia. The patient was not orthostatic and exhibited moderate hypertension. CMET and CBC work was unrevealing of a metabolic cause of his syncope. Urinalysis was similarly non-revealing. The patient has been scheduled to receive a 2D echocardiogram to evaluate for potential structural abnomalities of the  heart. Lastly, a TSH is pending.  The patient was scheduled for a f/u appt in two weeks. The patient was instructed to keep a blood pressure and heart rate log during this time. Furthermore, he was asked to write on the log each time he experiences an episode of dizziness. The patient was also asked to check his pulse if he begins to have one of the dizzy spells. We plan to consider a referral to cardiology at the next visit. Of note, the patient was instructed to go the ED if he has another episode of syncope.
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
## 2012-11-23 NOTE — Progress Notes (Signed)
I saw patient and discussed his care with Dr. Komanski at the time of the visit.  We reviewed the resident's history and exam and pertinent patient test results.  I agree with the assessment, diagnosis, and plan of care documented in the resident's note. 

## 2012-12-04 ENCOUNTER — Ambulatory Visit (INDEPENDENT_AMBULATORY_CARE_PROVIDER_SITE_OTHER): Payer: Medicare Other | Admitting: Internal Medicine

## 2012-12-04 ENCOUNTER — Encounter: Payer: Self-pay | Admitting: Internal Medicine

## 2012-12-04 VITALS — BP 146/94 | HR 86 | Temp 98.2°F | Ht 67.0 in | Wt 139.1 lb

## 2012-12-04 DIAGNOSIS — R030 Elevated blood-pressure reading, without diagnosis of hypertension: Secondary | ICD-10-CM

## 2012-12-04 DIAGNOSIS — F17200 Nicotine dependence, unspecified, uncomplicated: Secondary | ICD-10-CM

## 2012-12-04 NOTE — Assessment & Plan Note (Signed)
The patient currently smokes 3 cig/day. He smoked since his teenage years. He has quit for periods of up to one year. He is in the pre-contemplative stage when it comes to smoking cessation. We concelled him on the benefits of smoking cessation. He is receptive to the idea of stopping. He was unwilling to make a firm commitment about trying to quit. He will think about it.

## 2012-12-04 NOTE — Patient Instructions (Addendum)
Check BP weekly (goal is less than 150/90)  F/U with PCP in 6-8 Months  Smoking Cessation Quitting smoking is important to your health and has many advantages. However, it is not always easy to quit since nicotine is a very addictive drug. Often times, people try 3 times or more before being able to quit. This document explains the best ways for you to prepare to quit smoking. Quitting takes hard work and a lot of effort, but you can do it. ADVANTAGES OF QUITTING SMOKING  You will live longer, feel better, and live better.  Your body will feel the impact of quitting smoking almost immediately.  Within 20 minutes, blood pressure decreases. Your pulse returns to its normal level.  After 8 hours, carbon monoxide levels in the blood return to normal. Your oxygen level increases.  After 24 hours, the chance of having a heart attack starts to decrease. Your breath, hair, and body stop smelling like smoke.  After 48 hours, damaged nerve endings begin to recover. Your sense of taste and smell improve.  After 72 hours, the body is virtually free of nicotine. Your bronchial tubes relax and breathing becomes easier.  After 2 to 12 weeks, lungs can hold more air. Exercise becomes easier and circulation improves.  The risk of having a heart attack, stroke, cancer, or lung disease is greatly reduced.  After 1 year, the risk of coronary heart disease is cut in half.  After 5 years, the risk of stroke falls to the same as a nonsmoker.  After 10 years, the risk of lung cancer is cut in half and the risk of other cancers decreases significantly.  After 15 years, the risk of coronary heart disease drops, usually to the level of a nonsmoker.  If you are pregnant, quitting smoking will improve your chances of having a healthy baby.  The people you live with, especially any children, will be healthier.  You will have extra money to spend on things other than cigarettes. QUESTIONS TO THINK ABOUT  BEFORE ATTEMPTING TO QUIT You may want to talk about your answers with your caregiver.  Why do you want to quit?  If you tried to quit in the past, what helped and what did not?  What will be the most difficult situations for you after you quit? How will you plan to handle them?  Who can help you through the tough times? Your family? Friends? A caregiver?  What pleasures do you get from smoking? What ways can you still get pleasure if you quit? Here are some questions to ask your caregiver:  How can you help me to be successful at quitting?  What medicine do you think would be best for me and how should I take it?  What should I do if I need more help?  What is smoking withdrawal like? How can I get information on withdrawal? GET READY  Set a quit date.  Change your environment by getting rid of all cigarettes, ashtrays, matches, and lighters in your home, car, or work. Do not let people smoke in your home.  Review your past attempts to quit. Think about what worked and what did not. GET SUPPORT AND ENCOURAGEMENT You have a better chance of being successful if you have help. You can get support in many ways.  Tell your family, friends, and co-workers that you are going to quit and need their support. Ask them not to smoke around you.  Get individual, group, or telephone counseling and  support. Programs are available at [employer] and health centers. Call your local health department for information about programs in your area.  Spiritual beliefs and practices may help some smokers quit.  Download a "quit meter" on your computer to keep track of quit statistics, such as how long you have gone without smoking, cigarettes not smoked, and money saved.  Get a self-help book about quitting smoking and staying off of tobacco. LEARN NEW SKILLS AND BEHAVIORS  Distract yourself from urges to smoke. Talk to someone, go for a walk, or occupy your time with a task.  Change your  normal routine. Take a different route to work. Drink tea instead of coffee. Eat breakfast in a different place.  Reduce your stress. Take a hot bath, exercise, or read a book.  Plan something enjoyable to do every day. Reward yourself for not smoking.  Explore interactive web-based programs that specialize in helping you quit. GET MEDICINE AND USE IT CORRECTLY Medicines can help you stop smoking and decrease the urge to smoke. Combining medicine with the above behavioral methods and support can greatly increase your chances of successfully quitting smoking.  Nicotine replacement therapy helps deliver nicotine to your body without the negative effects and risks of smoking. Nicotine replacement therapy includes nicotine gum, lozenges, inhalers, nasal sprays, and skin patches. Some may be available over-the-counter and others require a prescription.  Antidepressant medicine helps people abstain from smoking, but how this works is unknown. This medicine is available by prescription.  Nicotinic receptor partial agonist medicine simulates the effect of nicotine in your brain. This medicine is available by prescription. Ask your caregiver for advice about which medicines to use and how to use them based on your health history. Your caregiver will tell you what side effects to look out for if you choose to be on a medicine or therapy. Carefully read the information on the package. Do not use any other product containing nicotine while using a nicotine replacement product.  RELAPSE OR DIFFICULT SITUATIONS Most relapses occur within the first 3 months after quitting. Do not be discouraged if you start smoking again. Remember, most people try several times before finally quitting. You may have symptoms of withdrawal because your body is used to nicotine. You may crave cigarettes, be irritable, feel very hungry, cough often, get headaches, or have difficulty concentrating. The withdrawal symptoms are only  temporary. They are strongest when you first quit, but they will go away within 10 14 days. To reduce the chances of relapse, try to:  Avoid drinking alcohol. Drinking lowers your chances of successfully quitting.  Reduce the amount of caffeine you consume. Once you quit smoking, the amount of caffeine in your body increases and can give you symptoms, such as a rapid heartbeat, sweating, and anxiety.  Avoid smokers because they can make you want to smoke.  Do not let weight gain distract you. Many smokers will gain weight when they quit, usually less than 10 pounds. Eat a healthy diet and stay active. You can always lose the weight gained after you quit.  Find ways to improve your mood other than smoking. FOR MORE INFORMATION  www.smokefree.gov  Document Released: 04/12/2001 Document Revised: 10/18/2011 Document Reviewed: 07/28/2011 [facility] Patient Information 2014 [place], [place].

## 2012-12-04 NOTE — Assessment & Plan Note (Signed)
A:  Blood pressure log indicates that the patient does not meet criteria for HTN  P:  Patient will check blood pressure at home weekly and will f/u with PCP in 6 month.

## 2012-12-04 NOTE — Progress Notes (Signed)
I saw and evaluated the patient.  I personally confirmed the key portions of the history and exam documented by Dr. Komanski and I reviewed pertinent patient test results.  The assessment, diagnosis, and plan were formulated together and I agree with the documentation in the resident's note.  
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
## 2012-12-13 ENCOUNTER — Emergency Department (HOSPITAL_COMMUNITY): Payer: Medicare Other

## 2012-12-13 ENCOUNTER — Observation Stay (HOSPITAL_COMMUNITY)
Admission: EM | Admit: 2012-12-13 | Discharge: 2012-12-14 | Disposition: A | Discharge status: Home / Self Care | Payer: Medicare Other | Attending: Internal Medicine | Admitting: Internal Medicine

## 2012-12-13 ENCOUNTER — Encounter (HOSPITAL_COMMUNITY): Payer: Self-pay | Admitting: *Deleted

## 2012-12-13 DIAGNOSIS — R079 Chest pain, unspecified: Secondary | ICD-10-CM | POA: Insufficient documentation

## 2012-12-13 DIAGNOSIS — R918 Other nonspecific abnormal finding of lung field: Secondary | ICD-10-CM | POA: Diagnosis present

## 2012-12-13 DIAGNOSIS — R209 Unspecified disturbances of skin sensation: Secondary | ICD-10-CM | POA: Insufficient documentation

## 2012-12-13 DIAGNOSIS — R059 Cough, unspecified: Principal | ICD-10-CM | POA: Insufficient documentation

## 2012-12-13 DIAGNOSIS — R0602 Shortness of breath: Secondary | ICD-10-CM | POA: Insufficient documentation

## 2012-12-13 DIAGNOSIS — R030 Elevated blood-pressure reading, without diagnosis of hypertension: Secondary | ICD-10-CM

## 2012-12-13 DIAGNOSIS — R05 Cough: Secondary | ICD-10-CM | POA: Insufficient documentation

## 2012-12-13 DIAGNOSIS — Z9001 Acquired absence of eye: Secondary | ICD-10-CM | POA: Insufficient documentation

## 2012-12-13 DIAGNOSIS — F17200 Nicotine dependence, unspecified, uncomplicated: Secondary | ICD-10-CM | POA: Diagnosis present

## 2012-12-13 DIAGNOSIS — K219 Gastro-esophageal reflux disease without esophagitis: Secondary | ICD-10-CM | POA: Insufficient documentation

## 2012-12-13 DIAGNOSIS — R222 Localized swelling, mass and lump, trunk: Secondary | ICD-10-CM | POA: Insufficient documentation

## 2012-12-13 DIAGNOSIS — Z23 Encounter for immunization: Secondary | ICD-10-CM | POA: Insufficient documentation

## 2012-12-13 DIAGNOSIS — R509 Fever, unspecified: Secondary | ICD-10-CM | POA: Insufficient documentation

## 2012-12-13 LAB — BASIC METABOLIC PANEL
BUN: 5 mg/dL — ABNORMAL LOW (ref 6–23)
Chloride: 99 mEq/L (ref 96–112)
GFR calc Af Amer: >90 mL/min (ref >90)
Potassium: 3.6 mEq/L (ref 3.5–5.1)

## 2012-12-13 LAB — CBC WITH DIFFERENTIAL/PLATELET
HCT: 40.1 % (ref 39.0–52.0)
Hemoglobin: 14.5 g/dL (ref 13.0–17.0)
Lymphocytes Relative: 25 % (ref 12–46)
MCHC: 36.2 g/dL — ABNORMAL HIGH (ref 30.0–36.0)
Monocytes Absolute: 1.1 10*3/uL — ABNORMAL HIGH (ref 0.1–1.0)
Monocytes Relative: 11 % (ref 3–12)
Neutro Abs: 6.6 10*3/uL (ref 1.7–7.7)
WBC: 10.4 10*3/uL (ref 4.0–10.5)

## 2012-12-13 MED ORDER — IOHEXOL 300 MG/ML  SOLN
80.0000 mL | Freq: Once | INTRAMUSCULAR | Status: AC | PRN
  Administered 2012-12-13: 80 mL via INTRAVENOUS

## 2012-12-13 MED ORDER — DEXTROSE 5 % IV SOLN
1.0000 g | Freq: Once | INTRAVENOUS | Status: AC
  Administered 2012-12-13: 1 g via INTRAVENOUS
  Filled 2012-12-13 (×2): qty 10

## 2012-12-13 MED ORDER — DEXTROSE 5 % IV SOLN
500.0000 mg | Freq: Once | INTRAVENOUS | Status: AC
  Administered 2012-12-14: 500 mg via INTRAVENOUS
  Filled 2012-12-13: qty 500
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
## 2012-12-13 NOTE — ED Notes (Signed)
Pt to ED c/o cough since 8/5.  Pt states c/o R sided chest pain that hurts when he coughs.  Hurts so bad he is unable to smoke.  Temp of 100.3.

## 2012-12-14 DIAGNOSIS — R030 Elevated blood-pressure reading, without diagnosis of hypertension: Secondary | ICD-10-CM

## 2012-12-14 DIAGNOSIS — R918 Other nonspecific abnormal finding of lung field: Secondary | ICD-10-CM

## 2012-12-14 DIAGNOSIS — F17200 Nicotine dependence, unspecified, uncomplicated: Secondary | ICD-10-CM

## 2012-12-14 LAB — BASIC METABOLIC PANEL
BUN: 5 mg/dL — ABNORMAL LOW (ref 6–23)
Calcium: 8.7 mg/dL (ref 8.4–10.5)
GFR calc non Af Amer: >90 mL/min (ref >90)
Glucose, Bld: 92 mg/dL (ref 70–99)

## 2012-12-14 LAB — CBC
HCT: 37.3 % — ABNORMAL LOW (ref 39.0–52.0)
Hemoglobin: 12.8 g/dL — ABNORMAL LOW (ref 13.0–17.0)
MCH: 31.8 pg (ref 26.0–34.0)
MCHC: 34.3 g/dL (ref 30.0–36.0)
MCV: 92.6 fL (ref 78.0–100.0)

## 2012-12-14 MED ORDER — HYDROCODONE-ACETAMINOPHEN 5-325 MG PO TABS: 1.0000 | ORAL_TABLET | ORAL | Status: DC | PRN

## 2012-12-14 MED ORDER — PNEUMOCOCCAL VAC POLYVALENT 25 MCG/0.5ML IJ INJ
0.5000 mL | INJECTION | INTRAMUSCULAR | Status: AC
  Administered 2012-12-14: 0.5 mL via INTRAMUSCULAR
  Filled 2012-12-14: qty 0.5

## 2012-12-14 MED ORDER — HEPARIN SODIUM (PORCINE) 5000 UNIT/ML IJ SOLN
5000.0000 [IU] | Freq: Three times a day (TID) | INTRAMUSCULAR | Status: DC
  Administered 2012-12-14: 5000 [IU] via SUBCUTANEOUS
  Filled 2012-12-14 (×4): qty 1

## 2012-12-14 MED ORDER — SODIUM CHLORIDE 0.9 % IV SOLN
INTRAVENOUS | Status: DC
  Administered 2012-12-14: 04:00:00 via INTRAVENOUS

## 2012-12-14 MED ORDER — SODIUM CHLORIDE 0.9 % IJ SOLN: 3.0000 mL | Freq: Two times a day (BID) | INTRAMUSCULAR | Status: DC

## 2012-12-14 NOTE — Progress Notes (Signed)
Discharged for home via transport by wife.  No voiced complaints.  Stated understanding of all discharge instructions and f/u appts.
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
## 2012-12-14 NOTE — Progress Notes (Signed)
Subjective: [name] is a 62 y.o. M with a history of tobacco abuse, colonic polyps who presents to the ED with a chief complaint of cough. CXR performed in the ED showed right upper and lower lobe pulmonary masses. Subsequent CT was performed showing multiple pulmonary nodules and masses in the lungs bilaterally, concerning for malignancy vs. atypical infectious process such as angioinvasive aspergillosis. He had a normal CXR in 07/2009.  Seen at bedside this AM. Says he is feeling much better. He said his right-sided chest pain has mostly subsided and his breathing has not been an issue. No fever, chills, nausea, vomiting.   Patient discharged today. For outpatient bronchoscopy on Monday. Objective: Vital signs in last 24 hours: Filed Vitals:   12/14/12 0000 12/14/12 0100 12/14/12 0300 12/14/12 0352  BP: 135/81 137/79 130/84 138/76  Pulse: 85 88 87 96  Temp:    100.1 F (37.8 C)  TempSrc:    Oral  Resp: 22 20 20 18   Height:    5\' 7"  (1.702 m)  Weight:    135 lb 12.8 oz (61.598 kg)  SpO2: 96% 95% 95% 98%   Weight change:   Intake/Output Summary (Last 24 hours) at 12/14/12 0719 Last data filed at 12/14/12 0600  Gross per 24 hour  Intake    360 ml  Output      0 ml  Net    360 ml   Physical Exam: General: Alert, cooperative, and in no apparent distress HEENT: Vision grossly intact, oropharynx clear and non-erythematous  Neck: Full range of motion without pain, supple, no lymphadenopathy or carotid bruits Lungs: Decreased air entry in right middle lung field. Otehrwise, no wheezes, rales, or rhonchi. Heart: Regular rate and rhythm, no murmurs, gallops, or rubs Abdomen: Soft, non-tender, non-distended, normal bowel sounds Extremities: No cyanosis, clubbing, or edema Neurologic: Alert & oriented X3, cranial nerves II-XII intact, strength grossly intact, sensation intact to light touch  Lab Results: Basic Metabolic Panel:  Recent Labs Lab 12/13/12 1642  NA 134*    K 3.6  CL 99  CO2 23  GLUCOSE 125*  BUN 5*  CREATININE 0.68  CALCIUM 9.3   Liver Function Tests: No results found for this basename: AST, ALT, ALKPHOS, BILITOT, PROT, ALBUMIN,  in the last 168 hours No results found for this basename: LIPASE, AMYLASE,  in the last 168 hours No results found for this basename: AMMONIA,  in the last 168 hours CBC:  Recent Labs Lab 12/13/12 1642 12/14/12 0620  WBC 10.4 9.0  NEUTROABS 6.6  --   HGB 14.5 12.8*  HCT 40.1 37.3*  MCV 92.2 92.6  PLT 244 213   Micro Results: No results found for this or any previous visit (from the past 240 hour(s)). Studies/Results: Dg Chest 2 View  12/13/2012   *RADIOLOGY REPORT*  Clinical Data: Cough and chest pain  CHEST - 2 VIEW  Comparison: 05/20/2009  Findings: There are new right upper lobe right lower lobe pulmonary masses, representative right lower lobe pulmonary mass measuring approximately 6.4 cm maximally.  Heart size is normal.  The left lung is grossly clear.  No pleural effusion.  No acute osseous finding.  IMPRESSION: Right upper and lower lobe pulmonary masses.  Given multiplicity, metastatic disease is favored over a primary synchronous bronchogenic carcinoma.  Alternative diagnosis such as rounded pneumonia or loculated fluid are considered much less likely. Chest CT with contrast is recommended nonemergently for further evaluation. These results were called by telephone on  12/13/2012 at 5:40 p.m. to after [facility], who verbally acknowledged these results.   Original Report Authenticated By: [name], M.D.   Ct Chest W Contrast  12/13/2012   *RADIOLOGY REPORT*  Clinical Data: Cough.  Fever.  Sputum production.  Masses seen on chest x-ray.  Chest pain.  CT CHEST WITH CONTRAST  Technique:  Multidetector CT imaging of the chest was performed following the standard protocol during bolus administration of intravenous contrast.  Contrast: 80mL OMNIPAQUE IOHEXOL 300 MG/ML  SOLN  Comparison: Chest x-ray  12/13/2012.  Findings:  Mediastinum: Heart size is normal. There is no significant pericardial fluid, thickening or pericardial calcification. Enlarged subcarinal lymph node or nodal conglomerate measuring 1.4 x 3.0 cm.  Multiple prominent but nonenlarged anterior and middle mediastinal lymph nodes.  Enlarged right infrahilar lymph node measuring 12 x 18 mm (image 35 of series 2).  Esophagus is unremarkable in appearance.  Lungs/Pleura: There are multiple pulmonary nodules and masses in the lungs bilaterally.  The largest mass is present in the right lower lobe measuring approximately 6.4 x 5.2 cm (image 39 of series 2) demonstrating some macrolobulated and spiculated margins with retraction of the overlying pleura.  There is some adjacent ground- glass attenuation along the inferomedial aspect of the mass. Additionally, in the central aspect of the mass there is low attenuation, and there is a suggestion of some tiny internal locules of gas, which are best demonstrated on image 39 of series 2.  There is a small right-sided pleural effusion adjacent to the mass. In the right upper lobe abutting the minor fissure there is an additional mass measuring approximately 2.6 x 3.5 cm (image 30 of series 2) which also has macrolobulated margins with slight spiculations, and extensive retraction of the overlying pleura anteriorly.  This lesion also had an adjacent area of cavitation best demonstrated on image 31 of series 3.  In the lateral aspect of the superior segment of the left lower lobe there is a small 8 mm nodule (image 22 of series 3), and additional small focus of adjacent ground-glass attenuation.  Upper Abdomen: Heterogeneity in the hepatic parenchyma, favored to reflect underlying hepatic steatosis (poorly characterized on today's examination).  Multiple tiny subcentimeter low attenuation lesions in the right lobe of the liver and central liver are incompletely characterized (at least two of these are favored to  represent tiny cysts, while other lesions are incompletely characterized and could represent metastatic lesions).  Musculoskeletal: There are no aggressive appearing lytic or blastic lesions noted in the visualized portions of the skeleton.  IMPRESSION: 1.  Multiple pulmonary nodules and masses, as above, the largest mass in the right lower lobe measuring 6.4 x 5.2 cm.  There is associated right hilar or mediastinal adenopathy, and a small right pleural effusion, as above.  Findings are most concerning for potential neoplasm, however, the imaging appearance of this is nonspecific, and the possibility of infection is not excluded.  In particular, atypical infectious process such as angioinvasive aspergillosis can present with similar findings as seen on today's examination.  Clinical correlation is recommended.  If the patient is immunocompromised, atypical infection warrants strong consideration.  In the absence of such history, primary bronchogenic neoplasm or metastatic disease to the lungs would be the primary consideration. 2.  In addition, there are several indeterminate liver lesions which are too small to characterize on today's examination and would likely require MRI of the abdomen with and without IV gadolinium for full characterization to exclude metastatic disease if indicated.  3.  Probable hepatic steatosis. 4.  Additional incidental findings, as above.   Original Report Authenticated By: Trudie Reed, M.D.   Medications: I have reviewed the patient's current medications. Scheduled Meds: . heparin  5,000 Units Subcutaneous Q8H  . pneumococcal 23 valent vaccine  0.5 mL Intramuscular Tomorrow-1000  . sodium chloride  3 mL Intravenous Q12H   Continuous Infusions: . sodium chloride 100 mL/hr at 12/14/12 0410   PRN Meds:.HYDROcodone-acetaminophen Assessment/Plan:  #Multiple pulmonary nodules- [name] is a 62 y.o. M with a history of tobacco abuse, colonic polyps who presents to  the ED with a chief complaint of cough, found to have multiple pulmonary nodules visualized on CXR and CT concerning for malignancy vs. aypical infection.  -Discussed the case w/ Dr. Delton Coombes (PCCM) and he feels these nodules can be accessed easily via bronchoscopy for biopsy. Mr. Sumler is scheduled for an outpatient bronchoscopy on Monday. -At this point, these nodules are most likely malignant rather than an atypical infection. The patient is HIV -ve.  #DVT PPX - subq heparin, SCDs  Dispo: Disposition is deferred at this time, awaiting improvement of current medical problems.  Anticipated discharge today.  The patient does have a current PCP Pleas Koch, MD) and does need an [facility] hospital follow-up appointment after discharge.  The patient does not have transportation limitations that hinder transportation to clinic appointments.  .Services Needed at time of discharge: Y = Yes, Blank = No PT:   OT:   RN:   Equipment:   Other:     LOS: 1 day   Lars Masson, MD 12/14/2012, 7:19 AM Pager: [phone]

## 2012-12-14 NOTE — Progress Notes (Signed)
Staff to room to review outpt bronchoscopy prep for Monday, 12/17/12 with patient. Their phone contact # is [phone] and pt has card with number.  Pt stated understanding he is not to eat or drink after midnight Sunday night, and to arrive for appt @ 0645 12/17/12 @ [facility].  Reviewed discharge instructions. Pt stated understanding.  Pt is awaiting ride home from spouse.

## 2012-12-14 NOTE — Progress Notes (Signed)
Utilization Review Completed Magdaleno Lortie J. Spyridon Hornstein, RN, BSN, NCM [phone]  

## 2012-12-14 NOTE — Progress Notes (Signed)
CSW referral received for Substance abuse. Review of record states that patient has current use of tobacco and history of crack cocaine use. Denies current use and no indication from chart of current use. This is clearly documented in Hx & Physical.  Smoking cessation information is provided by nursing staff.  At this time, there is no CSW intervention indicated.  CSW will sign off but will be available should further referral be indicated.  Lorri Frederick. [place]  [phone]
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
## 2012-12-14 NOTE — H&P (Signed)
Date: 12/14/2012  Patient name: Raymond Gregory  Medical record number: [id number]  Date of birth: [birth year]   I have seen and evaluated Virgina Jock and discussed their care with the Residency Team.  [phone] M with smoking history who presents with <7 day history of nightsweats, fever, chills and productive cough. CXR found large lesions concerning for maligancy.  Physical Exam: Blood pressure 131/78, pulse 89, temperature 98.4 F (36.9 C), temperature source Oral, resp. rate 18, height 5\' 7"  (1.702 m), weight 135 lb 12.8 oz (61.598 kg), SpO2 97.00%. Physical Exam  Constitutional: He is oriented to person, place, and time and well-developed, well-nourished, and in no distress. No distress.  HENT:  Head: Normocephalic and atraumatic.  Eyes: Conjunctivae and EOM are normal. Pupils are equal, round, and reactive to light.  Neck: Normal range of motion. Neck supple.  Cardiovascular: Normal rate, regular rhythm, normal heart sounds and intact distal pulses. Exam reveals no gallop and no friction rub.  No murmur heard.  Pulmonary/Chest: Effort normal. No accessory muscle usage. No respiratory distress. He has decreased breath sounds (Diminished throughout lung fields; difficult auscultation). He has no wheezes. He has no rhonchi. He has no rales. He exhibits no tenderness.  Abdominal: Soft. Bowel sounds are normal. He exhibits no distension. There is no tenderness.  Musculoskeletal: Normal range of motion. He exhibits no edema and no tenderness.  Neurological: He is alert and oriented to person, place, and time. No cranial nerve deficit. GCS score is 15.  Skin: Skin is warm and dry. No rash noted. He is not diaphoretic. No erythema. No pallor.  Psychiatric: Affect and judgment normal.      Lab results: Results for orders placed during the hospital encounter of 12/13/12 (from the past 24 hour(s))  CBC     Status: Abnormal   Collection Time    12/14/12  6:20 AM      Result Value Range   WBC  9.0  4.0 - 10.5 K/uL   RBC 4.03 (*) 4.22 - 5.81 MIL/uL   Hemoglobin 12.8 (*) 13.0 - 17.0 g/dL   HCT 56.2 (*) 13.0 - 86.5 %   MCV 92.6  78.0 - 100.0 fL   MCH 31.8  26.0 - 34.0 pg   MCHC 34.3  30.0 - 36.0 g/dL   RDW 78.4  69.6 - 29.5 %   Platelets 213  150 - 400 K/uL  BASIC METABOLIC PANEL     Status: Abnormal   Collection Time    12/14/12  6:20 AM      Result Value Range   Sodium 133 (*) 135 - 145 mEq/L   Potassium 3.4 (*) 3.5 - 5.1 mEq/L   Chloride 98  96 - 112 mEq/L   CO2 22  19 - 32 mEq/L   Glucose, Bld 92  70 - 99 mg/dL   BUN 5 (*) 6 - 23 mg/dL   Creatinine, Ser 2.84  0.50 - 1.35 mg/dL   Calcium 8.7  8.4 - 13.2 mg/dL   GFR calc non Af Amer >90  >90 mL/min   GFR calc Af Amer >90  >90 mL/min  HIV ANTIBODY (ROUTINE TESTING)     Status: None   Collection Time    12/14/12  6:20 AM      Result Value Range   HIV NON REACTIVE  NON REACTIVE    Imaging results:  Dg Chest 2 View  12/13/2012   *RADIOLOGY REPORT*  Clinical Data: Cough and chest pain  CHEST -  2 VIEW  Comparison: 05/20/2009  Findings: There are new right upper lobe right lower lobe pulmonary masses, representative right lower lobe pulmonary mass measuring approximately 6.4 cm maximally.  Heart size is normal.  The left lung is grossly clear.  No pleural effusion.  No acute osseous finding.  IMPRESSION: Right upper and lower lobe pulmonary masses.  Given multiplicity, metastatic disease is favored over a primary synchronous bronchogenic carcinoma.  Alternative diagnosis such as rounded pneumonia or loculated fluid are considered much less likely. Chest CT with contrast is recommended nonemergently for further evaluation. These results were called by telephone on 12/13/2012 at 5:40 p.m. to after [facility], who verbally acknowledged these results.   Original Report Authenticated By: [name], M.D.   Ct Chest W Contrast  12/13/2012   *RADIOLOGY REPORT*  Clinical Data: Cough.  Fever.  Sputum production.  Masses seen on chest  x-ray.  Chest pain.  CT CHEST WITH CONTRAST  Technique:  Multidetector CT imaging of the chest was performed following the standard protocol during bolus administration of intravenous contrast.  Contrast: 80mL OMNIPAQUE IOHEXOL 300 MG/ML  SOLN  Comparison: Chest x-ray 12/13/2012.  Findings:  Mediastinum: Heart size is normal. There is no significant pericardial fluid, thickening or pericardial calcification. Enlarged subcarinal lymph node or nodal conglomerate measuring 1.4 x 3.0 cm.  Multiple prominent but nonenlarged anterior and middle mediastinal lymph nodes.  Enlarged right infrahilar lymph node measuring 12 x 18 mm (image 35 of series 2).  Esophagus is unremarkable in appearance.  Lungs/Pleura: There are multiple pulmonary nodules and masses in the lungs bilaterally.  The largest mass is present in the right lower lobe measuring approximately 6.4 x 5.2 cm (image 39 of series 2) demonstrating some macrolobulated and spiculated margins with retraction of the overlying pleura.  There is some adjacent ground- glass attenuation along the inferomedial aspect of the mass. Additionally, in the central aspect of the mass there is low attenuation, and there is a suggestion of some tiny internal locules of gas, which are best demonstrated on image 39 of series 2.  There is a small right-sided pleural effusion adjacent to the mass. In the right upper lobe abutting the minor fissure there is an additional mass measuring approximately 2.6 x 3.5 cm (image 30 of series 2) which also has macrolobulated margins with slight spiculations, and extensive retraction of the overlying pleura anteriorly.  This lesion also had an adjacent area of cavitation best demonstrated on image 31 of series 3.  In the lateral aspect of the superior segment of the left lower lobe there is a small 8 mm nodule (image 22 of series 3), and additional small focus of adjacent ground-glass attenuation.  Upper Abdomen: Heterogeneity in the hepatic  parenchyma, favored to reflect underlying hepatic steatosis (poorly characterized on today's examination).  Multiple tiny subcentimeter low attenuation lesions in the right lobe of the liver and central liver are incompletely characterized (at least two of these are favored to represent tiny cysts, while other lesions are incompletely characterized and could represent metastatic lesions).  Musculoskeletal: There are no aggressive appearing lytic or blastic lesions noted in the visualized portions of the skeleton.  IMPRESSION: 1.  Multiple pulmonary nodules and masses, as above, the largest mass in the right lower lobe measuring 6.4 x 5.2 cm.  There is associated right hilar or mediastinal adenopathy, and a small right pleural effusion, as above.  Findings are most concerning for potential neoplasm, however, the imaging appearance of this is nonspecific, and the  possibility of infection is not excluded.  In particular, atypical infectious process such as angioinvasive aspergillosis can present with similar findings as seen on today's examination.  Clinical correlation is recommended.  If the patient is immunocompromised, atypical infection warrants strong consideration.  In the absence of such history, primary bronchogenic neoplasm or metastatic disease to the lungs would be the primary consideration. 2.  In addition, there are several indeterminate liver lesions which are too small to characterize on today's examination and would likely require MRI of the abdomen with and without IV gadolinium for full characterization to exclude metastatic disease if indicated. 3.  Probable hepatic steatosis. 4.  Additional incidental findings, as above.   Original Report Authenticated By: Trudie Reed, M.D.    Assessment and Plan: I have seen and evaluated the patient as outlined above. I agree with the formulated Assessment and Plan as detailed in the residents' admission note, with the following changes:   We will get  pulmonary consultation to see if pulmonary infiltrate can be accessible by bronchoscopy for futher work up for malignancy.   Judyann Munson, MD 8/15/201410:38 PM

## 2012-12-16 NOTE — Discharge Summary (Signed)
Name: Raymond Gregory MRN: [id number] DOB: [birth year] 62 y.o. PCP: Pleas Koch, MD  Date of Admission: 12/13/2012  6:12 PM Date of Discharge: 12/14/12 Attending Physician: No att. providers found  Discharge Diagnosis: 1. Abnormal CXR/Chest CT, w/ multiple new pulmonary Nodules- Patient had a normal CXR in 2011. Presented to the ED w/ complaints of cough and right-sided chest pain for 2 weeks. CXR showed new pulmonary nodules in right lung. CT confirmed these findings. Suspicious for new lung CA. 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 2. HTN- Well controlled w/out medications 3. Tobacco abuse  Discharge Medications:   Medication List         aspirin 81 MG tablet  Take 81 mg by mouth daily.        Disposition and follow-up:   [name] was discharged from [facility] in good condition.  At the hospital follow up visit please address:  1.  Discuss recent bronchoscopy findings and plans for the future. Any complaints of chest pain, cough, hemoptysis. Discuss smoking cessation.  2.  Labs / imaging needed at time of follow-up: none  3.  Pending labs/ test needing follow-up: Bronchoscopy/pathology results for new multiple lung masses.  Follow-up Appointments:     Follow-up Information   Follow up with [name]., MD On 12/21/2012. (Appt at 1:30.  Arrive at 1:15)    Specialty:  Pulmonary Disease   Contact information:   [address] [place] [address] [phone]       Follow up with Bronchoscopy at  On 12/17/2012. (Appt at 8:00 AM - Report to [name] Admitting at 7:00 AM)    Contact information:   Call [phone] for detailed instructions regarding Bronchoscopy.       Follow up with [name], MD On 12/27/2012. (1:45 PM)    Specialty:  Internal Medicine   Contact information:   [address] [place] [address] [phone]       Discharge Instructions: Discharge Orders   Future Appointments  Provider Department Dept Phone   12/17/2012 8:00 AM [facility] [place] [facility] [phone]   12/21/2012 1:30 PM [name], MD [facility] [phone]   12/27/2012 1:45 PM [name], MD [facility] [phone]   Future Orders Complete By Expires   Call MD for:  difficulty breathing, headache or visual disturbances  As directed    Call MD for:  extreme fatigue  As directed    Call MD for:  persistant nausea and vomiting  As directed    Diet - low sodium heart healthy  As directed    Increase activity slowly  As directed       Consultations:  Pulmonary Critical Care  Procedures Performed:  Dg Chest 2 View  12/13/2012   *RADIOLOGY REPORT*  Clinical Data: Cough and chest pain  CHEST - 2 VIEW  Comparison: 05/20/2009  Findings: There are new right upper lobe right lower lobe pulmonary masses, representative right lower lobe pulmonary mass measuring approximately 6.4 cm maximally.  Heart size is normal.  The left lung is grossly clear.  No pleural effusion.  No acute osseous finding.  IMPRESSION: Right upper and lower lobe pulmonary masses.  Given multiplicity, metastatic disease is favored over a primary synchronous bronchogenic carcinoma.  Alternative diagnosis such as rounded pneumonia or loculated fluid are considered much less likely. Chest CT with contrast is recommended nonemergently for further evaluation. These results were called by telephone  on 12/13/2012 at 5:40 p.m. to after [facility], who verbally acknowledged these results.   Original Report Authenticated By: [name], M.D.   Ct Chest W Contrast  12/13/2012   *RADIOLOGY REPORT*  Clinical Data: Cough.  Fever.  Sputum production.  Masses seen on chest x-ray.  Chest pain.  CT CHEST WITH CONTRAST  Technique:  Multidetector CT imaging of the chest was performed following the standard protocol during bolus administration of intravenous contrast.  Contrast:  80mL OMNIPAQUE IOHEXOL 300 MG/ML  SOLN  Comparison: Chest x-ray 12/13/2012.  Findings:  Mediastinum: Heart size is normal. There is no significant pericardial fluid, thickening or pericardial calcification. Enlarged subcarinal lymph node or nodal conglomerate measuring 1.4 x 3.0 cm.  Multiple prominent but nonenlarged anterior and middle mediastinal lymph nodes.  Enlarged right infrahilar lymph node measuring 12 x 18 mm (image 35 of series 2).  Esophagus is unremarkable in appearance.  Lungs/Pleura: There are multiple pulmonary nodules and masses in the lungs bilaterally.  The largest mass is present in the right lower lobe measuring approximately 6.4 x 5.2 cm (image 39 of series 2) demonstrating some macrolobulated and spiculated margins with retraction of the overlying pleura.  There is some adjacent ground- glass attenuation along the inferomedial aspect of the mass. Additionally, in the central aspect of the mass there is low attenuation, and there is a suggestion of some tiny internal locules of gas, which are best demonstrated on image 39 of series 2.  There is a small right-sided pleural effusion adjacent to the mass. In the right upper lobe abutting the minor fissure there is an additional mass measuring approximately 2.6 x 3.5 cm (image 30 of series 2) which also has macrolobulated margins with slight spiculations, and extensive retraction of the overlying pleura anteriorly.  This lesion also had an adjacent area of cavitation best demonstrated on image 31 of series 3.  In the lateral aspect of the superior segment of the left lower lobe there is a small 8 mm nodule (image 22 of series 3), and additional small focus of adjacent ground-glass attenuation.  Upper Abdomen: Heterogeneity in the hepatic parenchyma, favored to reflect underlying hepatic steatosis (poorly characterized on today's examination).  Multiple tiny subcentimeter low attenuation lesions in the right lobe of the liver and central liver are  incompletely characterized (at least two of these are favored to represent tiny cysts, while other lesions are incompletely characterized and could represent metastatic lesions).  Musculoskeletal: There are no aggressive appearing lytic or blastic lesions noted in the visualized portions of the skeleton.  IMPRESSION: 1.  Multiple pulmonary nodules and masses, as above, the largest mass in the right lower lobe measuring 6.4 x 5.2 cm.  There is associated right hilar or mediastinal adenopathy, and a small right pleural effusion, as above.  Findings are most concerning for potential neoplasm, however, the imaging appearance of this is nonspecific, and the possibility of infection is not excluded.  In particular, atypical infectious process such as angioinvasive aspergillosis can present with similar findings as seen on today's examination.  Clinical correlation is recommended.  If the patient is immunocompromised, atypical infection warrants strong consideration.  In the absence of such history, primary bronchogenic neoplasm or metastatic disease to the lungs would be the primary consideration. 2.  In addition, there are several indeterminate liver lesions which are too small to characterize on today's examination and would likely require MRI of the abdomen with and without IV gadolinium for full characterization to exclude metastatic disease if  indicated. 3.  Probable hepatic steatosis. 4.  Additional incidental findings, as above.   Original Report Authenticated By: Trudie Reed, M.D.   Admission HPI:  Mr. DOMONIK LEVARIO is a 62 y.o. M with a history of tobacco abuse, colonic polyps who presents to the ED with a chief complaint of cough.  Mr. Flagg has had a productive cough for about 2 weeks. He noted his sputum was initially brown but has now become clear. 2-3 days ago he developed subjective fevers and chills, as well as a sharp pain in his sternum radiating to the right flank, present primarily when he  coughs. He has taken no medicines for the cough or for the pain. He has never had such symptoms before. He denies shortness of breath (except occasionally when he coughs), bright red blood in his sputum, nausea, vomiting, diarrhea.  CXR performed in the ED showed right upper and lower lobe pulmonary masses. Subsequent CT was performed showing multiple pulmonary nodules and masses in the lungs bilaterally, concerning for malignancy vs. atypical infectious process such as angioinvasive aspergillosis. He had a normal CXR in 07/2009.  The patient has smoked 3-6 cigarettes a day for over 40 years; denies heavier use than that. Both his mother and his father died of cancer. He thinks his father had lung cancer. He is not sure what type of cancer his mother had, but he knows she had brain mets. He denies recent weight loss, night sweats, low-grade fever. His most recent colonoscopy was on 07/11/2011. 3 sessile polyps were removed; surgical pathology showed tubular adenoma, no high grade dysplasia or malignancy identified. Denies a history of constipation, BRBPR, melena. He has a distant history of illicit drug use, mostly crack cocaine, but he has never injected drugs. He has never been incarcerated. Denies recent travel.  Hospital Course by problem list:   1. Abnormal CXR/Chest CT, w/ multiple new pulmonary nodules- Patient presented complaints of cough for the past 2 weeks w/ accompanying, progressing right-sided chest pain. In ED, CXR showed right upper and lower lobe pulmonary masses. Given multiplicity, metastatic disease is favored over a primary synchronous bronchogenic carcinoma. CT was performed showing multiple lung nodules with spiculation; areas of cavitation noted. Admitting team spoke with the radiologist who read the study, and his impression was that this represented cancer unless there was a definite history of the patient being immunocompromised, in which case aspergillosis was also possible. On  admission, HIV testing was negative. Patient was admitted and started on NS @ 100 ml/hr. Lab performed showed no abnormalities. PCCM was consulted and imaging was reviewed by Dr. Delton Coombes. Given Mr. Sawaya stable condition, he was scheduled for a bronchoscopy w/ biopsies for 12/16/12 as an outpatient at [facility]. The patient was discharged home for further testing as mentioned. Outpatient follow-up was arranged w/ Dr. Delton Coombes and the [facility].  2. HTN- Not an issue during this admission. Patient currently takes no medications for his HTN. Closely followed in the [facility].  Discharge Vitals:   BP 131/78  Pulse 89  Temp(Src) 98.4 F (36.9 C) (Oral)  Resp 18  Ht 5\' 7"  (1.702 m)  Wt 135 lb 12.8 oz (61.598 kg)  BMI 21.26 kg/m2  SpO2 97%  Discharge Labs:  No results found for this or any previous visit (from the past 24 hour(s)).  Signed: Lars Masson, MD 12/16/2012, 2:28 PM   Time Spent on Discharge: 35 minutes Services Ordered on Discharge: none Equipment Ordered on Discharge: none

## 2012-12-17 ENCOUNTER — Encounter (HOSPITAL_COMMUNITY)
Admission: RE | Disposition: A | Discharge status: Home / Self Care | Payer: Self-pay | Source: Ambulatory Visit | Attending: Emergency Medicine

## 2012-12-17 ENCOUNTER — Ambulatory Visit (HOSPITAL_COMMUNITY)
Admit: 2012-12-17 | Discharge: 2012-12-17 | Disposition: A | Discharge status: Home / Self Care | Payer: Medicare Other | Attending: Emergency Medicine | Admitting: Emergency Medicine

## 2012-12-17 ENCOUNTER — Encounter (HOSPITAL_COMMUNITY): Payer: Self-pay | Admitting: Radiology

## 2012-12-17 ENCOUNTER — Ambulatory Visit (HOSPITAL_COMMUNITY)
Admission: RE | Admit: 2012-12-17 | Discharge: 2012-12-17 | Disposition: A | Discharge status: Home / Self Care | Payer: Medicare Other | Source: Ambulatory Visit | Attending: Emergency Medicine | Admitting: Emergency Medicine

## 2012-12-17 ENCOUNTER — Other Ambulatory Visit (HOSPITAL_COMMUNITY): Payer: Self-pay | Admitting: Emergency Medicine

## 2012-12-17 ENCOUNTER — Ambulatory Visit (HOSPITAL_COMMUNITY): Payer: Medicare Other

## 2012-12-17 DIAGNOSIS — F17200 Nicotine dependence, unspecified, uncomplicated: Secondary | ICD-10-CM | POA: Insufficient documentation

## 2012-12-17 DIAGNOSIS — R918 Other nonspecific abnormal finding of lung field: Secondary | ICD-10-CM | POA: Diagnosis present

## 2012-12-17 DIAGNOSIS — R222 Localized swelling, mass and lump, trunk: Secondary | ICD-10-CM | POA: Insufficient documentation

## 2012-12-17 DIAGNOSIS — K7689 Other specified diseases of liver: Secondary | ICD-10-CM | POA: Insufficient documentation

## 2012-12-17 DIAGNOSIS — J398 Other specified diseases of upper respiratory tract: Secondary | ICD-10-CM | POA: Insufficient documentation

## 2012-12-17 DIAGNOSIS — K219 Gastro-esophageal reflux disease without esophagitis: Secondary | ICD-10-CM | POA: Insufficient documentation

## 2012-12-17 HISTORY — PX: VIDEO BRONCHOSCOPY: SHX5072

## 2012-12-17 SURGERY — BRONCHOSCOPY, WITH FLUOROSCOPY
Anesthesia: Moderate Sedation | Laterality: Bilateral

## 2012-12-17 MED ORDER — LIDOCAINE HCL 2 % EX GEL
CUTANEOUS | Status: DC | PRN
  Administered 2012-12-17: 1

## 2012-12-17 MED ORDER — MIDAZOLAM HCL 10 MG/2ML IJ SOLN
INTRAMUSCULAR | Status: AC
  Filled 2012-12-17: qty 4

## 2012-12-17 MED ORDER — FENTANYL CITRATE 0.05 MG/ML IJ SOLN
INTRAMUSCULAR | Status: DC | PRN
  Administered 2012-12-17 (×3): 25 ug via INTRAVENOUS
  Administered 2012-12-17 (×2): 50 ug via INTRAVENOUS

## 2012-12-17 MED ORDER — FENTANYL CITRATE 0.05 MG/ML IJ SOLN
INTRAMUSCULAR | Status: AC
  Filled 2012-12-17: qty 4

## 2012-12-17 MED ORDER — SODIUM CHLORIDE 0.9 % IV SOLN
INTRAVENOUS | Status: DC | PRN
  Administered 2012-12-17: 300 mL via INTRAMUSCULAR

## 2012-12-17 MED ORDER — MIDAZOLAM HCL 10 MG/2ML IJ SOLN
INTRAMUSCULAR | Status: DC | PRN
  Administered 2012-12-17 (×2): 3 mg via INTRAVENOUS
  Administered 2012-12-17: 1 mg via INTRAVENOUS

## 2012-12-17 MED ORDER — LIDOCAINE HCL 1 % IJ SOLN
INTRAMUSCULAR | Status: DC | PRN
  Administered 2012-12-17: 6 mL via RESPIRATORY_TRACT

## 2012-12-17 MED ORDER — PHENYLEPHRINE HCL 0.25 % NA SOLN
NASAL | Status: DC | PRN
  Administered 2012-12-17: 2 via NASAL

## 2012-12-17 NOTE — Interval H&P Note (Signed)
PCCM Interval Note  No new issues. Pt did not take ant meds this am. Understands procedure  Filed Vitals:   12/17/12 0730 12/17/12 0735 12/17/12 0740 12/17/12 0745  BP: 138/98 146/92 157/83 159/97  Pulse: 74 69 71 71  Resp: 14 17 15 18   Height:      Weight:      SpO2: 98% 98% 96% 98%   Gen: Pleasant, well-nourished, in no distress,  normal affect  ENT: No lesions,  mouth clear,  oropharynx clear, no postnasal drip  Neck: No JVD, no TMG, no carotid bruits  Lungs: No use of accessory muscles, clear without rales or rhonchi  Cardiovascular: RRR, heart sounds normal, no murmur or gallops, no peripheral edema  Musculoskeletal: No deformities, no cyanosis or clubbing  Neuro: alert, non focal  Skin: Warm, no lesions or rashes   Recent Labs Lab 12/13/12 1642 12/14/12 0620  HGB 14.5 12.8*  HCT 40.1 37.3*  WBC 10.4 9.0  PLT 244 213    Recent Labs Lab 12/13/12 1642 12/14/12 0620  NA 134* 133*  K 3.6 3.4*  CL 99 98  CO2 23 22  GLUCOSE 125* 92  BUN 5* 5*  CREATININE 0.68 0.55  CALCIUM 9.3 8.7     CT chest  Mediastinum: Heart size is normal. There is no significant  pericardial fluid, thickening or pericardial calcification.  Enlarged subcarinal lymph node or nodal conglomerate measuring 1.4  x 3.0 cm. Multiple prominent but nonenlarged anterior and middle  mediastinal lymph nodes. Enlarged right infrahilar lymph node  measuring 12 x 18 mm (image 35 of series 2). Esophagus is  unremarkable in appearance.  Lungs/Pleura: There are multiple pulmonary nodules and masses in  the lungs bilaterally. The largest mass is present in the right  lower lobe measuring approximately 6.4 x 5.2 cm (image 39 of series  2) demonstrating some macrolobulated and spiculated margins with  retraction of the overlying pleura. There is some adjacent ground-  glass attenuation along the inferomedial aspect of the mass.  Additionally, in the central aspect of the mass there is low   attenuation, and there is a suggestion of some tiny internal  locules of gas, which are best demonstrated on image 39 of series  2. There is a small right-sided pleural effusion adjacent to the  mass.  In the right upper lobe abutting the minor fissure there is an  additional mass measuring approximately 2.6 x 3.5 cm (image 30 of  series 2) which also has macrolobulated margins with slight  spiculations, and extensive retraction of the overlying pleura  anteriorly. This lesion also had an adjacent area of cavitation  best demonstrated on image 31 of series 3. In the lateral aspect  of the superior segment of the left lower lobe there is a small 8  mm nodule (image 22 of series 3), and additional small focus of  adjacent ground-glass attenuation.  Upper Abdomen: Heterogeneity in the hepatic parenchyma, favored to  reflect underlying hepatic steatosis (poorly characterized on  today's examination). Multiple tiny subcentimeter low attenuation  lesions in the right lobe of the liver and central liver are  incompletely characterized (at least two of these are favored to  represent tiny cysts, while other lesions are incompletely  characterized and could represent metastatic lesions).  Musculoskeletal: There are no aggressive appearing lytic or blastic  lesions noted in the visualized portions of the skeleton.  IMPRESSION:  1. Multiple pulmonary nodules and masses, as above, the largest  mass in the  right lower lobe measuring 6.4 x 5.2 cm. There is  associated right hilar or mediastinal adenopathy, and a small right  pleural effusion, as above. Findings are most concerning for  potential neoplasm, however, the imaging appearance of this is  nonspecific, and the possibility of infection is not excluded. In  particular, atypical infectious process such as angioinvasive  aspergillosis can present with similar findings as seen on today's  examination. Clinical correlation is recommended. If  the patient  is immunocompromised, atypical infection warrants strong  consideration. In the absence of such history, primary  bronchogenic neoplasm or metastatic disease to the lungs would be  the primary consideration.  2. In addition, there are several indeterminate liver lesions  which are too small to characterize on today's examination and  would likely require MRI of the abdomen with and without IV  gadolinium for full characterization to exclude metastatic disease  if indicated.  3. Probable hepatic steatosis.  4. Additional incidental findings, as above.  Plans: will proceed with FOB and bx's  Levy Pupa, MD, PhD 12/17/2012, 8:03 AM [facility] [phone] or if no answer [phone]

## 2012-12-17 NOTE — Progress Notes (Signed)
Video bronchoscopy with brushing intervention, biopsy intervention, washing intervention  Levy Pupa, MD, PhD 12/17/2012, 12:03 PM  Pulmonary [facility] Critical Care [phone] or if no answer [phone]

## 2012-12-17 NOTE — Op Note (Signed)
Video Bronchoscopy Procedure Note  Date of Operation: 12/17/2012  Pre-op Diagnosis: B lung masses  Post-op Diagnosis: Same  Surgeon: Levy Pupa  Assistants: none  Anesthesia: conscious sedation, moderate sedation  Meds Given: fentanyl , versed 7mg  in divided doses, 1% lidocaine 20cc total  Operation: Flexible video fiberoptic bronchoscopy and biopsies.  Estimated Blood Loss: 10-15cc  Complications: none noted  Indications and History: Raymond Gregory is 62 y.o. with history of tobacco use. He was found to RUL and RLL mass on CT scan chest as well as a smaller L sided nodule.  Recommendation was to perform video fiberoptic bronchoscopy with biopsies. The risks, benefits, complications, treatment options and expected outcomes were discussed with the patient.  The possibilities of pneumothorax, pneumonia, reaction to medication, pulmonary aspiration, perforation of a viscus, bleeding, failure to diagnose a condition and creating a complication requiring transfusion or operation were discussed with the patient who freely signed the consent.    Description of Procedure: The patient was seen in the Preoperative Area, was examined and was deemed appropriate to proceed.  The patient was taken to [facility], identified as Raymond Gregory and the procedure verified as Flexible Video Fiberoptic Bronchoscopy.  A Time Out was held and the above information confirmed.   Conscious sedation was initiated as indicated above. The video fiberoptic bronchoscope was introduced via the L nare and a general inspection was performed which showed normal cords although the posterior pharynx was crowded. The  Trachea was inspected and showed several discrete white raised lesions. These could not be suctioned away. They did not bleed on manipulation. Endobronchial brushings and endobronchial biopsies were obtained on the tracheal lesions. The main carina appeared normal. The R sided airways were  inspected and showed normal RUL, BI, RML and RLL. The L side was then inspected. The LLL, Lingular and LUL airways were normal. Under fluroscopic guidance transbronchial brushings and transbronchial forceps biopsies were performed in the RLL. There was some initial moderate bleeding that stopped quickly. Finally endobronchial washings were pooled to be sent for cytology. The patient tolerated the procedure well. The bronchoscope was removed. There were no obvious complications.   Samples: 1. Endobronchial brushings from trachea 2. Endobronchial forceps biopsies from trachea 3. Transbronchial brushings from the RLL 4. Transbronchial forceps biopsies from the RLL 5. Pooled bronchial biopsies  Plans:  We will review the cytology, pathology results with the patient when they become available.  Outpatient followup will be with Dr Delton Coombes.    Levy Pupa, MD, PhD 12/17/2012, 8:53 AM [facility] [phone] or if no answer [phone]
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
## 2012-12-18 ENCOUNTER — Encounter (HOSPITAL_COMMUNITY): Payer: Self-pay | Admitting: Emergency Medicine

## 2012-12-18 NOTE — Discharge Summary (Signed)
  Date: 12/18/2012  Patient name: MORRISON MCBRYAR  Medical record number: [id number]  Date of birth: 12-15-1950   This patient has been seen and the plan of care was discussed with the house staff. Please see their note for complete details. I concur with their findings with the following additions/corrections:  I agree with the discharge plan as outlined by Dr. Yetta Barre. Mr. Alegria found to have new lung nodule/mass concerning for malignancy. Mr. Setters was seen by pulmonary service who is coordinating care as an outpatient to undergo bronchoscpy and further diagnostics of the lung mass.  Judyann Munson, MD 12/18/2012, 9:48 PM

## 2012-12-21 ENCOUNTER — Encounter: Payer: Self-pay | Admitting: Emergency Medicine

## 2012-12-21 ENCOUNTER — Ambulatory Visit (INDEPENDENT_AMBULATORY_CARE_PROVIDER_SITE_OTHER): Payer: Medicare Other | Admitting: Emergency Medicine

## 2012-12-21 VITALS — BP 120/90 | HR 68 | Temp 98.0°F | Ht 67.0 in | Wt 136.6 lb

## 2012-12-21 DIAGNOSIS — R918 Other nonspecific abnormal finding of lung field: Secondary | ICD-10-CM

## 2012-12-21 NOTE — Patient Instructions (Addendum)
We will arrange for a needle biopsy of your Right lung Follow with Dr Delton Coombes in 1 month

## 2012-12-21 NOTE — Assessment & Plan Note (Addendum)
His bx's do not give a definitive dx. I believe he needs a needle bx, will consult IR to perform.  Discussed tobacco cessation - he has quit Discuss PFT next time F/u 1 mon
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
## 2012-12-25 ENCOUNTER — Encounter (HOSPITAL_COMMUNITY)
Admission: RE | Admit: 2012-12-25 | Discharge: 2012-12-25 | Disposition: A | Discharge status: Home / Self Care | Payer: Medicare Other | Source: Ambulatory Visit | Attending: Emergency Medicine | Admitting: Emergency Medicine

## 2012-12-25 DIAGNOSIS — R599 Enlarged lymph nodes, unspecified: Secondary | ICD-10-CM | POA: Diagnosis not present

## 2012-12-25 DIAGNOSIS — R222 Localized swelling, mass and lump, trunk: Secondary | ICD-10-CM | POA: Diagnosis not present

## 2012-12-25 DIAGNOSIS — R918 Other nonspecific abnormal finding of lung field: Secondary | ICD-10-CM | POA: Insufficient documentation

## 2012-12-25 DIAGNOSIS — I7090 Unspecified atherosclerosis: Secondary | ICD-10-CM | POA: Insufficient documentation

## 2012-12-25 LAB — GLUCOSE, CAPILLARY: Glucose-Capillary: 106 mg/dL — ABNORMAL HIGH (ref 70–99)

## 2012-12-25 MED ORDER — FLUDEOXYGLUCOSE F - 18 (FDG) INJECTION
18.9000 | Freq: Once | INTRAVENOUS | Status: AC | PRN
  Administered 2012-12-25: 18.9 via INTRAVENOUS

## 2012-12-27 ENCOUNTER — Ambulatory Visit (INDEPENDENT_AMBULATORY_CARE_PROVIDER_SITE_OTHER): Payer: Medicare Other | Admitting: Internal Medicine

## 2012-12-27 ENCOUNTER — Encounter: Payer: Self-pay | Admitting: Internal Medicine

## 2012-12-27 VITALS — BP 135/84 | HR 69 | Temp 97.6°F | Wt 138.7 lb

## 2012-12-27 DIAGNOSIS — R918 Other nonspecific abnormal finding of lung field: Secondary | ICD-10-CM

## 2012-12-27 DIAGNOSIS — Z23 Encounter for immunization: Secondary | ICD-10-CM

## 2012-12-27 NOTE — Assessment & Plan Note (Addendum)
Pt awaiting needle bx by IR. Advised him to stop Aspirin 7 days prior to procedure. He will follow her in a 2-3 weeks.
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
## 2012-12-27 NOTE — Patient Instructions (Addendum)
I will send a message to Dr Delton Coombes to find out when the biopsy is going to be done You will need to stop aspirin 7 days before the procedure   Otherwise follow up in 2-3 weeks. Please call clinic with questions.

## 2012-12-30 NOTE — Progress Notes (Signed)
Case discussed with Dr. Kazibwe soon after the resident saw the patient.  We reviewed the resident's history and exam and pertinent patient test results.  I agree with the assessment, diagnosis, and plan of care documented in the resident's note. 

## 2013-01-03 ENCOUNTER — Other Ambulatory Visit: Payer: Self-pay | Admitting: Radiology

## 2013-01-04 ENCOUNTER — Encounter (HOSPITAL_COMMUNITY): Payer: Self-pay | Admitting: Pharmacy Technician

## 2013-01-07 ENCOUNTER — Encounter (HOSPITAL_COMMUNITY): Payer: Self-pay

## 2013-01-07 ENCOUNTER — Ambulatory Visit (HOSPITAL_COMMUNITY)
Admission: RE | Admit: 2013-01-07 | Discharge: 2013-01-07 | Disposition: A | Discharge status: Home / Self Care | Payer: Medicare Other | Source: Ambulatory Visit | Attending: Emergency Medicine | Admitting: Emergency Medicine

## 2013-01-07 ENCOUNTER — Other Ambulatory Visit (HOSPITAL_COMMUNITY): Payer: Self-pay | Admitting: Interventional Radiology

## 2013-01-07 ENCOUNTER — Ambulatory Visit (HOSPITAL_COMMUNITY)
Admission: RE | Admit: 2013-01-07 | Discharge: 2013-01-07 | Disposition: A | Discharge status: Home / Self Care | Payer: Medicare Other | Source: Ambulatory Visit | Attending: Interventional Radiology | Admitting: Interventional Radiology

## 2013-01-07 DIAGNOSIS — Z9889 Other specified postprocedural states: Secondary | ICD-10-CM

## 2013-01-07 DIAGNOSIS — M129 Arthropathy, unspecified: Secondary | ICD-10-CM | POA: Insufficient documentation

## 2013-01-07 DIAGNOSIS — K219 Gastro-esophageal reflux disease without esophagitis: Secondary | ICD-10-CM | POA: Insufficient documentation

## 2013-01-07 DIAGNOSIS — Z860100 Personal history of colon polyps, unspecified: Secondary | ICD-10-CM | POA: Insufficient documentation

## 2013-01-07 DIAGNOSIS — R918 Other nonspecific abnormal finding of lung field: Secondary | ICD-10-CM

## 2013-01-07 DIAGNOSIS — J45909 Unspecified asthma, uncomplicated: Secondary | ICD-10-CM | POA: Insufficient documentation

## 2013-01-07 DIAGNOSIS — C3430 Malignant neoplasm of lower lobe, unspecified bronchus or lung: Secondary | ICD-10-CM | POA: Insufficient documentation

## 2013-01-07 DIAGNOSIS — Z970 Presence of artificial eye: Secondary | ICD-10-CM | POA: Insufficient documentation

## 2013-01-07 DIAGNOSIS — Z87891 Personal history of nicotine dependence: Secondary | ICD-10-CM | POA: Insufficient documentation

## 2013-01-07 LAB — PROTIME-INR
INR: 0.95 (ref 0.00–1.49)
Prothrombin Time: 12.5 seconds (ref 11.6–15.2)

## 2013-01-07 LAB — CBC
Hemoglobin: 13.1 g/dL (ref 13.0–17.0)
MCHC: 34 g/dL (ref 30.0–36.0)
Platelets: 231 10*3/uL (ref 150–400)
RBC: 4.1 MIL/uL — ABNORMAL LOW (ref 4.22–5.81)

## 2013-01-07 LAB — APTT: aPTT: 28 seconds (ref 24–37)

## 2013-01-07 MED ORDER — LIDOCAINE HCL 2 % EX GEL: Freq: Once | CUTANEOUS | Status: DC

## 2013-01-07 MED ORDER — HYDROCODONE-ACETAMINOPHEN 5-325 MG PO TABS: 1.0000 | ORAL_TABLET | ORAL | Status: DC | PRN

## 2013-01-07 MED ORDER — LIDOCAINE HCL 1 % IJ SOLN
INTRAMUSCULAR | Status: AC
  Filled 2013-01-07: qty 10

## 2013-01-07 MED ORDER — FENTANYL CITRATE 0.05 MG/ML IJ SOLN
INTRAMUSCULAR | Status: AC
  Filled 2013-01-07: qty 4

## 2013-01-07 MED ORDER — PHENYLEPHRINE HCL 0.25 % NA SOLN: 1.0000 | Freq: Four times a day (QID) | NASAL | Status: DC | PRN

## 2013-01-07 MED ORDER — MIDAZOLAM HCL 2 MG/2ML IJ SOLN
INTRAMUSCULAR | Status: AC | PRN
  Administered 2013-01-07 (×2): 1 mg via INTRAVENOUS
  Administered 2013-01-07: 2 mg via INTRAVENOUS

## 2013-01-07 MED ORDER — FENTANYL CITRATE 0.05 MG/ML IJ SOLN
INTRAMUSCULAR | Status: AC | PRN
  Administered 2013-01-07 (×2): 50 ug via INTRAVENOUS

## 2013-01-07 MED ORDER — SODIUM CHLORIDE 0.9 % IV SOLN
INTRAVENOUS | Status: DC
  Administered 2013-01-07: 08:00:00 via INTRAVENOUS

## 2013-01-07 MED ORDER — MIDAZOLAM HCL 2 MG/2ML IJ SOLN
INTRAMUSCULAR | Status: AC
  Filled 2013-01-07: qty 6
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
## 2013-01-07 NOTE — Procedures (Signed)
Successful CT GUIDED RLL MASS 18G CORE BX X 2 NO COMP STABLE FULL REPORT IN PACS

## 2013-01-08 ENCOUNTER — Telehealth (HOSPITAL_COMMUNITY): Payer: Self-pay | Admitting: *Deleted

## 2013-01-08 NOTE — Telephone Encounter (Signed)
Post procedure follow up call, spoke with pt.  Says doing well with no problems at this time.  Says care received good

## 2013-01-10 ENCOUNTER — Ambulatory Visit (INDEPENDENT_AMBULATORY_CARE_PROVIDER_SITE_OTHER): Payer: Medicare Other | Admitting: Internal Medicine

## 2013-01-10 ENCOUNTER — Encounter: Payer: Self-pay | Admitting: Internal Medicine

## 2013-01-10 VITALS — BP 134/85 | HR 79 | Temp 97.9°F | Wt 138.8 lb

## 2013-01-10 DIAGNOSIS — R918 Other nonspecific abnormal finding of lung field: Secondary | ICD-10-CM

## 2013-01-10 NOTE — Assessment & Plan Note (Signed)
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 Currently patient does not have chest pain, shortness of breath or significant weight loss. He is generally doing fine. He was mentally prepared for the bad news from the biopsy. He was calm when I told him the biopsy results. His wife is very supportive. His physical examination is positive only for slightly decreased air movement in his right lung. No lymphadenopathy in neck and axillary area.   -will give referral to oncology for further evaluation and treatment.

## 2013-01-10 NOTE — Patient Instructions (Signed)
1. Please follow up with cancer doctor as referred.   2.  If you have worsening of your symptoms or new symptoms arise, please call the clinic [phone]), or go to the ER immediately if symptoms are severe.  Lung Cancer Lung cancer is a tumor which starts as a growth in your lungs. Cancer is a group of many related diseases that begin in cells, the building blocks of the body. Normally, cells grow and divide to produce more cells only when the body needs them. Sometimes cells keep dividing when new cells are not needed. These extra cells may form a mass of tissue called a growth or tumor. Tumors can be either benign (not cancerous) or malignant (cancerous). Cancer can begin in any organ or tissue of the body. The original tumor (where the tumor started out) is called the primary cancer and is usually named for where it begins.  Lung cancer is the most common cause of cancer death in men and women. There are several different types of lung cancers. Usually, lung cancer is described as either small-cell lung cancer or non-small-cell lung cancer. Other types of cancer occur in the lungs, including carcinoid and cancers spread from other organs. The types of cancer have different behavior and treatment. CAUSES  This cancer usually starts when the lungs are exposed to harmful chemicals. When you quit smoking, your risk of lung cancer falls each year (but is never the same as a person who has never smoked).  Other risks include:   Radon gas exposure.  Asbestos and other industrial substance exposure.  Second hand tobacco smoke.  Air pollution.  Family or personal history of lung cancer.  Age over 77. SYMPTOMS  Lung cancer can cause many symptoms. They depend on the type of cancer, its location and other factors. Symptoms of lung cancer can include:  Cough (either new, different or more severe).  Shortness of breath.  Coughing up blood (hemoptysis).  Chest pain.  Hoarseness.  Swelling of  the face.  Drooping eyelid.  Changes in blood tests: low sodium (hyponatremia), high calcium (hypercalcemia) or low blood count (anemia).  Weight loss. In its early stages, lung cancer may not have symptoms and can be discovered by accident. Many of the symptoms above can be caused by diseases other than lung cancer. DIAGNOSIS  In early lung cancer, the patient often does not notice problems. It usually has spread by the time problems are first noticed. Your caregiver may suspect lung cancer based on your symptoms, your exam or based on tests (such as x-rays) obtained for other reasons. Common tests that help your caregiver diagnose your condition include:  Chest x-ray.  CT scan of the lungs and chest.  Blood tests. If a tumor is found, a biopsy will be necessary to confirm that cancer is present and to determine the type of cancer. TREATMENT   Surgery offers a hope for a cure if the cancer has not spread and the cancer is not a small cell (oat cell) cancer of the lung. Surgery cannot cure the small cell type of cancer.  Radiation Therapy is a form of high energy X-ray that helps slow or kill the cancer. It is often used along with medications (chemotherapy) to help treat the cancer and control pain.  Chemotherapy is used in combination with surgery in advanced cancer. It is also used in all small cell cancers.  Many new treatments look promising.  Your caregiver can give you more information and discuss treatment options  that are best for your type of cancer. HOME CARE INSTRUCTIONS   If you smoke, stop!  Take all medications as told.  Keep all appointments with your caregiver and other specialists.  Ask your caregiver if you should see a cancer specialist, if that has not been arranged.  If you require oxygen or breathing equipment, be sure you know how to use it and who to call with questions.  Follow any special diet directions. If you have problems with appetite, ask your  caregiver for help. SEEK MEDICAL CARE IF:   You have had a surgical procedure are you are having trouble recovering.  You have ongoing weight loss.  You have decreased strength or energy past the point when your caregiver said you would feel better.  You develop nausea or lightheadedness.  You have pain that is not improving. SEEK IMMEDIATE MEDICAL CARE IF:   You cough up clotted blood or bright red blood.  Your pain is uncontrolled.  You develop new difficulty breathing or chest pain.  You develop swelling in one or both ankles or legs, or swelling in your face or neck.  You develop new headache or confusion. Document Released: 07/25/2000 Document Revised: 07/11/2011 Document Reviewed: 05/05/2008 [facility] Patient Information 2014 [place], [place].
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
## 2013-01-11 ENCOUNTER — Telehealth: Payer: Self-pay | Admitting: *Deleted

## 2013-01-11 ENCOUNTER — Telehealth: Payer: Self-pay | Admitting: Emergency Medicine

## 2013-01-11 DIAGNOSIS — C3490 Malignant neoplasm of unspecified part of unspecified bronchus or lung: Secondary | ICD-10-CM

## 2013-01-11 NOTE — Telephone Encounter (Signed)
Spoke with pt regarding his needle bx results > show adenoCA of the lung. Will refer him to [facility], he agrees w the plan.

## 2013-01-11 NOTE — Telephone Encounter (Signed)
Spoke with pt gave him appt for mtoc 01/17/13 at 1:00 arrive at 12:45.  He verbalized understanding of time and place of appt

## 2013-01-13 NOTE — Progress Notes (Signed)
Case discussed with Dr. Niu at the time of the visit.  We reviewed the resident's history and exam and pertinent patient test results.  I agree with the assessment, diagnosis, and plan of care documented in the resident's note.    

## 2013-01-16 ENCOUNTER — Telehealth: Payer: Self-pay | Admitting: Internal Medicine

## 2013-01-16 NOTE — Telephone Encounter (Signed)
C/D 01/16/13 for appt. 01/17/13

## 2013-01-17 ENCOUNTER — Encounter: Payer: Self-pay | Admitting: Internal Medicine

## 2013-01-17 ENCOUNTER — Ambulatory Visit: Payer: Medicare Other | Attending: Internal Medicine | Admitting: Physical Therapy

## 2013-01-17 ENCOUNTER — Telehealth: Payer: Self-pay | Admitting: Internal Medicine

## 2013-01-17 ENCOUNTER — Telehealth: Payer: Self-pay | Admitting: *Deleted

## 2013-01-17 ENCOUNTER — Encounter: Payer: Self-pay | Admitting: *Deleted

## 2013-01-17 ENCOUNTER — Ambulatory Visit (HOSPITAL_BASED_OUTPATIENT_CLINIC_OR_DEPARTMENT_OTHER): Payer: Medicare Other | Admitting: Internal Medicine

## 2013-01-17 ENCOUNTER — Ambulatory Visit
Admission: RE | Admit: 2013-01-17 | Discharge: 2013-01-17 | Disposition: A | Discharge status: Home / Self Care | Payer: Medicare Other | Source: Ambulatory Visit | Attending: Radiation Oncology | Admitting: Radiation Oncology

## 2013-01-17 DIAGNOSIS — Z01818 Encounter for other preprocedural examination: Secondary | ICD-10-CM | POA: Insufficient documentation

## 2013-01-17 DIAGNOSIS — C3490 Malignant neoplasm of unspecified part of unspecified bronchus or lung: Secondary | ICD-10-CM

## 2013-01-17 DIAGNOSIS — M549 Dorsalgia, unspecified: Secondary | ICD-10-CM | POA: Insufficient documentation

## 2013-01-17 DIAGNOSIS — M7989 Other specified soft tissue disorders: Secondary | ICD-10-CM | POA: Insufficient documentation

## 2013-01-17 DIAGNOSIS — IMO0001 Reserved for inherently not codable concepts without codable children: Secondary | ICD-10-CM | POA: Insufficient documentation

## 2013-01-17 DIAGNOSIS — C3431 Malignant neoplasm of lower lobe, right bronchus or lung: Secondary | ICD-10-CM | POA: Insufficient documentation

## 2013-01-17 MED ORDER — CYANOCOBALAMIN 1000 MCG/ML IJ SOLN
1000.0000 ug | Freq: Once | INTRAMUSCULAR | Status: AC
  Administered 2013-01-17: 1000 ug via INTRAMUSCULAR

## 2013-01-17 MED ORDER — FOLIC ACID 1 MG PO TABS: 1.0000 mg | ORAL_TABLET | Freq: Every day | ORAL | Status: DC

## 2013-01-17 MED ORDER — PROCHLORPERAZINE MALEATE 10 MG PO TABS: 10.0000 mg | ORAL_TABLET | Freq: Four times a day (QID) | ORAL | Status: AC | PRN

## 2013-01-17 MED ORDER — CYANOCOBALAMIN 1000 MCG/ML IJ SOLN
INTRAMUSCULAR | Status: AC
  Filled 2013-01-17: qty 1

## 2013-01-17 MED ORDER — DEXAMETHASONE 4 MG PO TABS: ORAL_TABLET | ORAL | Status: DC

## 2013-01-17 NOTE — Progress Notes (Signed)
I met with the patient today.  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  His right lung disease is significant and would require a large radiation field. He has discussed neoadjuvant chemotherapy x 3-4 cycles with Dr. Arbutus Ped which is reasonable.  I will plan to see him back after chemotherapy.  I explained these issues to him and his sister.

## 2013-01-17 NOTE — Progress Notes (Signed)
   Thoracic Treatment Summary Name:Raymond Gregory Date:01/17/2013 DOB:1950/05/17 Your Medical Team Medical Oncologist: Dr. Arbutus Ped Radiation Oncologist: Dr. Michell Heinrich Pulmonologist: Dr. Delton Coombes  Type and Stage of Lung Cancer Non-Small Cell Carcinoma: Adenocarcinoma Clinical Stage:  III/ IV   Clinical stage is based on radiology exams.  Pathological stage will be determined after surgery.  Staging is based on the size of the tumor, involvement of lymph nodes or not, and whether or not the cancer center has spread. Recommendations Recommendations: MRI Brain, Chemo class, chemotherapy every three weeks and labs every week  These recommendations are based on information available as of today's consult.  This is subject to change depending further testing or exams. Next Steps Next Step: 1. Medical Oncology will set up appointments [phone]  Barriers to Care What do you perceive as a potential barrier that may prevent you from receiving your treatment plan? Nothing perceived at this time Resources: American [employer] Society [phone] NCI booklet on lung cancer Questions Willette Pa, RN BSN Thoracic Oncology Nurse Navigator at [phone]  Raymond Gregory is a nurse navigator that is available to assist you through your cancer journey.  She can answer your questions and/or provide resources regarding your treatment plan, emotional support, or financial concerns.

## 2013-01-17 NOTE — Telephone Encounter (Signed)
Per staff message and POF I have scheduled appts.  JMW  
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
## 2013-01-17 NOTE — Telephone Encounter (Signed)
, °
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
## 2013-01-19 NOTE — Patient Instructions (Signed)
Chemotherapy next week.  Followup visit in 2 weeks.

## 2013-01-22 ENCOUNTER — Ambulatory Visit: Payer: Medicare Other

## 2013-01-22 ENCOUNTER — Encounter: Payer: Self-pay | Admitting: *Deleted

## 2013-01-22 ENCOUNTER — Telehealth: Payer: Self-pay | Admitting: Dietician

## 2013-01-22 NOTE — Telephone Encounter (Signed)
Brief Outpatient Oncology Nutrition Note  Patient has been identified to be at risk on malnutrition screen.  Wt Readings from Last 10 Encounters:  01/17/13 141 lb 3.2 oz (64.048 kg)  01/10/13 138 lb 12.8 oz (62.959 kg)  01/07/13 138 lb (62.596 kg)  12/27/12 138 lb 11.2 oz (62.914 kg)  12/21/12 136 lb 9.6 oz (61.961 kg)  12/17/12 135 lb (61.236 kg)  12/17/12 135 lb (61.236 kg)  12/14/12 135 lb 12.8 oz (61.598 kg)  12/04/12 139 lb 1.6 oz (63.095 kg)  11/22/12 139 lb 9.6 oz (63.322 kg)   Recently dx stage IIIA non-small cell lung cancer consistent with adenocarcinoma.  This could also represent stage IV if the left lower lobe lung nodule is consistent with malignancy.    Called patient and left a message with [facility] contact information.    Oran Rein, RD, LDN

## 2013-01-23 ENCOUNTER — Ambulatory Visit (HOSPITAL_BASED_OUTPATIENT_CLINIC_OR_DEPARTMENT_OTHER): Payer: Medicare Other

## 2013-01-23 ENCOUNTER — Other Ambulatory Visit (HOSPITAL_BASED_OUTPATIENT_CLINIC_OR_DEPARTMENT_OTHER): Payer: Medicare Other | Admitting: Lab

## 2013-01-23 ENCOUNTER — Other Ambulatory Visit: Payer: Self-pay | Admitting: Internal Medicine

## 2013-01-23 ENCOUNTER — Encounter: Payer: Self-pay | Admitting: Internal Medicine

## 2013-01-23 DIAGNOSIS — Z5111 Encounter for antineoplastic chemotherapy: Secondary | ICD-10-CM

## 2013-01-23 DIAGNOSIS — C3490 Malignant neoplasm of unspecified part of unspecified bronchus or lung: Secondary | ICD-10-CM

## 2013-01-23 LAB — CBC WITH DIFFERENTIAL/PLATELET
BASO%: 0.1 % (ref 0.0–2.0)
LYMPH%: 20.8 % (ref 14.0–49.0)
MCHC: 33.5 g/dL (ref 32.0–36.0)
MCV: 93.4 fL (ref 79.3–98.0)
MONO%: 3.7 % (ref 0.0–14.0)
NEUT%: 75.4 % — ABNORMAL HIGH (ref 39.0–75.0)
Platelets: 298 10*3/uL (ref 140–400)
RBC: 4.22 10*6/uL (ref 4.20–5.82)
nRBC: 0 % (ref 0–0)

## 2013-01-23 LAB — COMPREHENSIVE METABOLIC PANEL (CC13)
ALT: 16 U/L (ref 0–55)
AST: 22 U/L (ref 5–34)
BUN: 12.1 mg/dL (ref 7.0–26.0)
CO2: 23 mEq/L (ref 22–29)
Creatinine: 0.7 mg/dL (ref 0.7–1.3)
Total Bilirubin: 0.76 mg/dL (ref 0.20–1.20)

## 2013-01-23 MED ORDER — DEXAMETHASONE SODIUM PHOSPHATE 20 MG/5ML IJ SOLN
INTRAMUSCULAR | Status: AC
  Filled 2013-01-23: qty 5

## 2013-01-23 MED ORDER — SODIUM CHLORIDE 0.9 % IV SOLN
Freq: Once | INTRAVENOUS | Status: AC
  Administered 2013-01-23: 14:00:00 via INTRAVENOUS

## 2013-01-23 MED ORDER — SODIUM CHLORIDE 0.9 % IV SOLN
626.5000 mg | Freq: Once | INTRAVENOUS | Status: AC
  Administered 2013-01-23: 630 mg via INTRAVENOUS
  Filled 2013-01-23: qty 63

## 2013-01-23 MED ORDER — SODIUM CHLORIDE 0.9 % IV SOLN
500.0000 mg/m2 | Freq: Once | INTRAVENOUS | Status: AC
  Administered 2013-01-23: 875 mg via INTRAVENOUS
  Filled 2013-01-23: qty 35

## 2013-01-23 MED ORDER — DEXAMETHASONE SODIUM PHOSPHATE 20 MG/5ML IJ SOLN
20.0000 mg | Freq: Once | INTRAMUSCULAR | Status: AC
  Administered 2013-01-23: 20 mg via INTRAVENOUS

## 2013-01-23 MED ORDER — ONDANSETRON 16 MG/50ML IVPB (CHCC)
INTRAVENOUS | Status: AC
  Filled 2013-01-23: qty 16

## 2013-01-23 MED ORDER — ONDANSETRON 16 MG/50ML IVPB (CHCC)
16.0000 mg | Freq: Once | INTRAVENOUS | Status: AC
  Administered 2013-01-23: 16 mg via INTRAVENOUS

## 2013-01-23 NOTE — Progress Notes (Signed)
Put fmla form on nurse's desk °

## 2013-01-23 NOTE — Patient Instructions (Signed)
[facility] Discharge Instructions for Patients Receiving Chemotherapy  Today you received the following chemotherapy agents- Alimta and Carboplatin To help prevent nausea and vomiting after your treatment, we encourage you to take your nausea medication Compazine 10 mg every 6 hours as needed for nausea.   If you develop nausea and vomiting that is not controlled by your nausea medication, call the clinic.   BELOW ARE SYMPTOMS THAT SHOULD BE REPORTED IMMEDIATELY:  *FEVER GREATER THAN 100.5 F  *CHILLS WITH OR WITHOUT FEVER  NAUSEA AND VOMITING THAT IS NOT CONTROLLED WITH YOUR NAUSEA MEDICATION  *UNUSUAL SHORTNESS OF BREATH  *UNUSUAL BRUISING OR BLEEDING  TENDERNESS IN MOUTH AND THROAT WITH OR WITHOUT PRESENCE OF ULCERS  *URINARY PROBLEMS  *BOWEL PROBLEMS  UNUSUAL RASH Items with * indicate a potential emergency and should be followed up as soon as possible.  Feel free to call the clinic you have any questions or concerns. The clinic phone number is [phone].

## 2013-01-24 ENCOUNTER — Telehealth: Payer: Self-pay | Admitting: *Deleted

## 2013-01-24 NOTE — Telephone Encounter (Signed)
Received call from [place] at [facility] ([phone] ext: [phone]) that there is not sufficient tissue to run the genetic testing Dr Donnald Garre wanted.  No other sufficient samples available at pathology.  Informed Dr Donnald Garre, called and left a message with Jon Gills that Dr Donnald Garre is aware and the order can be cancelled per Dr Donnald Garre.

## 2013-01-29 ENCOUNTER — Encounter: Payer: Self-pay | Admitting: Internal Medicine

## 2013-01-29 NOTE — Progress Notes (Signed)
Put fmla form in registration desk °

## 2013-01-31 ENCOUNTER — Telehealth: Payer: Self-pay | Admitting: Internal Medicine

## 2013-01-31 ENCOUNTER — Other Ambulatory Visit (HOSPITAL_BASED_OUTPATIENT_CLINIC_OR_DEPARTMENT_OTHER): Payer: Medicare Other | Admitting: Lab

## 2013-01-31 ENCOUNTER — Encounter: Payer: Self-pay | Admitting: Physician Assistant

## 2013-01-31 ENCOUNTER — Ambulatory Visit (HOSPITAL_BASED_OUTPATIENT_CLINIC_OR_DEPARTMENT_OTHER): Payer: Medicare Other | Admitting: Physician Assistant

## 2013-01-31 DIAGNOSIS — C3430 Malignant neoplasm of lower lobe, unspecified bronchus or lung: Secondary | ICD-10-CM

## 2013-01-31 DIAGNOSIS — R110 Nausea: Secondary | ICD-10-CM

## 2013-01-31 LAB — CBC WITH DIFFERENTIAL/PLATELET
BASO%: 1.8 % (ref 0.0–2.0)
Eosinophils Absolute: 0.2 10*3/uL (ref 0.0–0.5)
HCT: 37.1 % — ABNORMAL LOW (ref 38.4–49.9)
LYMPH%: 43 % (ref 14.0–49.0)
MCH: 31 pg (ref 27.2–33.4)
MCHC: 33.1 g/dL (ref 32.0–36.0)
MCV: 93.8 fL (ref 79.3–98.0)
MONO#: 0.4 10*3/uL (ref 0.1–0.9)
NEUT#: 1.5 10*3/uL (ref 1.5–6.5)
Platelets: 108 10*3/uL — ABNORMAL LOW (ref 140–400)
RBC: 3.95 10*6/uL — ABNORMAL LOW (ref 4.20–5.82)
WBC: 3.8 10*3/uL — ABNORMAL LOW (ref 4.0–10.3)
lymph#: 1.6 10*3/uL (ref 0.9–3.3)

## 2013-01-31 LAB — COMPREHENSIVE METABOLIC PANEL (CC13)
ALT: 38 U/L (ref 0–55)
Albumin: 3.5 g/dL (ref 3.5–5.0)
BUN: 24.5 mg/dL (ref 7.0–26.0)
CO2: 26 mEq/L (ref 22–29)
Calcium: 9.4 mg/dL (ref 8.4–10.4)
Chloride: 99 mEq/L (ref 98–109)
Glucose: 109 mg/dl (ref 70–140)
Sodium: 135 mEq/L — ABNORMAL LOW (ref 136–145)
Total Bilirubin: 1.14 mg/dL (ref 0.20–1.20)
Total Protein: 7.9 g/dL (ref 6.4–8.3)

## 2013-01-31 MED ORDER — ONDANSETRON HCL 8 MG PO TABS: 8.0000 mg | ORAL_TABLET | Freq: Three times a day (TID) | ORAL | Status: DC | PRN

## 2013-01-31 NOTE — Patient Instructions (Addendum)
Take Zofran (ondansetron) 8 mg by mouth every 8 hours as needed for nausea or vomiting Continue weekly labs as scheduled Follow up in 2 weeks prior to your next scheduled cycle of chemotherapy

## 2013-01-31 NOTE — Telephone Encounter (Signed)
gv pt appt schedule for October and November.  °
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
## 2013-02-05 ENCOUNTER — Telehealth: Payer: Self-pay | Admitting: *Deleted

## 2013-02-05 NOTE — Telephone Encounter (Signed)
Called left message for pt to call 

## 2013-02-06 ENCOUNTER — Telehealth: Payer: Self-pay | Admitting: *Deleted

## 2013-02-06 ENCOUNTER — Other Ambulatory Visit (HOSPITAL_BASED_OUTPATIENT_CLINIC_OR_DEPARTMENT_OTHER): Payer: Medicare Other | Admitting: Lab

## 2013-02-06 DIAGNOSIS — C3490 Malignant neoplasm of unspecified part of unspecified bronchus or lung: Secondary | ICD-10-CM

## 2013-02-06 LAB — CBC WITH DIFFERENTIAL/PLATELET
BASO%: 0.5 % (ref 0.0–2.0)
EOS%: 4.5 % (ref 0.0–7.0)
Eosinophils Absolute: 0.2 10*3/uL (ref 0.0–0.5)
LYMPH%: 50.6 % — ABNORMAL HIGH (ref 14.0–49.0)
MCHC: 32.9 g/dL (ref 32.0–36.0)
MONO#: 0.5 10*3/uL (ref 0.1–0.9)
NEUT#: 1 10*3/uL — ABNORMAL LOW (ref 1.5–6.5)
Platelets: 171 10*3/uL (ref 140–400)
RBC: 3.46 10*6/uL — ABNORMAL LOW (ref 4.20–5.82)
RDW: 13.8 % (ref 11.0–14.6)
WBC: 3.4 10*3/uL — ABNORMAL LOW (ref 4.0–10.3)
lymph#: 1.7 10*3/uL (ref 0.9–3.3)

## 2013-02-06 LAB — COMPREHENSIVE METABOLIC PANEL (CC13)
ALT: 62 U/L — ABNORMAL HIGH (ref 0–55)
AST: 64 U/L — ABNORMAL HIGH (ref 5–34)
Albumin: 3.2 g/dL — ABNORMAL LOW (ref 3.5–5.0)
Alkaline Phosphatase: 102 U/L (ref 40–150)
Anion Gap: 7 mEq/L (ref 3–11)
Chloride: 103 mEq/L (ref 98–109)
Glucose: 72 mg/dl (ref 70–140)
Potassium: 4.4 mEq/L (ref 3.5–5.1)
Sodium: 136 mEq/L (ref 136–145)
Total Bilirubin: 0.51 mg/dL (ref 0.20–1.20)
Total Protein: 7.4 g/dL (ref 6.4–8.3)

## 2013-02-06 NOTE — Telephone Encounter (Signed)
Called left message to call with my phone number

## 2013-02-07 ENCOUNTER — Encounter: Payer: Self-pay | Admitting: *Deleted

## 2013-02-07 ENCOUNTER — Other Ambulatory Visit: Payer: Self-pay | Admitting: *Deleted

## 2013-02-11 ENCOUNTER — Ambulatory Visit (HOSPITAL_COMMUNITY)
Admission: RE | Admit: 2013-02-11 | Discharge: 2013-02-11 | Disposition: A | Discharge status: Home / Self Care | Payer: Medicare Other | Source: Ambulatory Visit | Attending: Internal Medicine | Admitting: Internal Medicine

## 2013-02-11 ENCOUNTER — Other Ambulatory Visit: Payer: Self-pay | Admitting: Internal Medicine

## 2013-02-11 DIAGNOSIS — C3490 Malignant neoplasm of unspecified part of unspecified bronchus or lung: Secondary | ICD-10-CM | POA: Insufficient documentation

## 2013-02-11 MED ORDER — GADOBENATE DIMEGLUMINE 529 MG/ML IV SOLN
13.0000 mL | Freq: Once | INTRAVENOUS | Status: AC | PRN
  Administered 2013-02-11: 13 mL via INTRAVENOUS

## 2013-02-13 ENCOUNTER — Ambulatory Visit: Payer: Medicare Other | Admitting: Nutrition

## 2013-02-13 ENCOUNTER — Ambulatory Visit: Payer: Medicare Other

## 2013-02-13 ENCOUNTER — Ambulatory Visit (HOSPITAL_BASED_OUTPATIENT_CLINIC_OR_DEPARTMENT_OTHER): Payer: Medicare Other | Admitting: Physician Assistant

## 2013-02-13 ENCOUNTER — Encounter: Payer: Self-pay | Admitting: Physician Assistant

## 2013-02-13 ENCOUNTER — Ambulatory Visit (HOSPITAL_BASED_OUTPATIENT_CLINIC_OR_DEPARTMENT_OTHER): Payer: Medicare Other

## 2013-02-13 ENCOUNTER — Other Ambulatory Visit (HOSPITAL_BASED_OUTPATIENT_CLINIC_OR_DEPARTMENT_OTHER): Payer: Medicare Other | Admitting: Lab

## 2013-02-13 ENCOUNTER — Encounter: Payer: Self-pay | Admitting: Internal Medicine

## 2013-02-13 DIAGNOSIS — Z5111 Encounter for antineoplastic chemotherapy: Secondary | ICD-10-CM

## 2013-02-13 DIAGNOSIS — C3430 Malignant neoplasm of lower lobe, unspecified bronchus or lung: Secondary | ICD-10-CM

## 2013-02-13 LAB — COMPREHENSIVE METABOLIC PANEL (CC13)
AST: 35 U/L — ABNORMAL HIGH (ref 5–34)
Albumin: 3.7 g/dL (ref 3.5–5.0)
Anion Gap: 10 mEq/L (ref 3–11)
BUN: 20 mg/dL (ref 7.0–26.0)
CO2: 23 mEq/L (ref 22–29)
Calcium: 10.9 mg/dL — ABNORMAL HIGH (ref 8.4–10.4)
Chloride: 104 mEq/L (ref 98–109)
Creatinine: 1 mg/dL (ref 0.7–1.3)
Glucose: 114 mg/dl (ref 70–140)
Potassium: 4.9 mEq/L (ref 3.5–5.1)
Total Protein: 8.8 g/dL — ABNORMAL HIGH (ref 6.4–8.3)

## 2013-02-13 LAB — CBC WITH DIFFERENTIAL/PLATELET
BASO%: 0.2 % (ref 0.0–2.0)
Basophils Absolute: 0 10*3/uL (ref 0.0–0.1)
HCT: 34.6 % — ABNORMAL LOW (ref 38.4–49.9)
LYMPH%: 22.5 % (ref 14.0–49.0)
MCHC: 33.2 g/dL (ref 32.0–36.0)
MCV: 93.3 fL (ref 79.3–98.0)
MONO#: 0.3 10*3/uL (ref 0.1–0.9)
NEUT%: 72.1 % (ref 39.0–75.0)
Platelets: 236 10*3/uL (ref 140–400)
WBC: 5.6 10*3/uL (ref 4.0–10.3)
lymph#: 1.3 10*3/uL (ref 0.9–3.3)
nRBC: 0 % (ref 0–0)

## 2013-02-13 MED ORDER — SODIUM CHLORIDE 0.9 % IV SOLN
376.0000 mg | Freq: Once | INTRAVENOUS | Status: AC
  Administered 2013-02-13: 380 mg via INTRAVENOUS
  Filled 2013-02-13: qty 38

## 2013-02-13 MED ORDER — SODIUM CHLORIDE 0.9 % IV SOLN
500.0000 mg/m2 | Freq: Once | INTRAVENOUS | Status: AC
  Administered 2013-02-13: 875 mg via INTRAVENOUS
  Filled 2013-02-13: qty 35

## 2013-02-13 MED ORDER — SODIUM CHLORIDE 0.9 % IV SOLN
Freq: Once | INTRAVENOUS | Status: AC
  Administered 2013-02-13: 16:00:00 via INTRAVENOUS

## 2013-02-13 MED ORDER — DEXAMETHASONE SODIUM PHOSPHATE 20 MG/5ML IJ SOLN
INTRAMUSCULAR | Status: AC
  Filled 2013-02-13: qty 5

## 2013-02-13 MED ORDER — ONDANSETRON 16 MG/50ML IVPB (CHCC)
16.0000 mg | Freq: Once | INTRAVENOUS | Status: AC
  Administered 2013-02-13: 16 mg via INTRAVENOUS

## 2013-02-13 MED ORDER — ONDANSETRON 16 MG/50ML IVPB (CHCC)
INTRAVENOUS | Status: AC
  Filled 2013-02-13: qty 16

## 2013-02-13 MED ORDER — DEXAMETHASONE SODIUM PHOSPHATE 20 MG/5ML IJ SOLN
20.0000 mg | Freq: Once | INTRAMUSCULAR | Status: AC
  Administered 2013-02-13: 20 mg via INTRAVENOUS

## 2013-02-13 NOTE — Patient Instructions (Signed)
[facility] Discharge Instructions for Patients Receiving Chemotherapy  Today you received the following chemotherapy agents Alimta and Carboplatin.  To help prevent nausea and vomiting after your treatment, we encourage you to take your nausea medication.   If you develop nausea and vomiting that is not controlled by your nausea medication, call the clinic.   BELOW ARE SYMPTOMS THAT SHOULD BE REPORTED IMMEDIATELY:  *FEVER GREATER THAN 100.5 F  *CHILLS WITH OR WITHOUT FEVER  NAUSEA AND VOMITING THAT IS NOT CONTROLLED WITH YOUR NAUSEA MEDICATION  *UNUSUAL SHORTNESS OF BREATH  *UNUSUAL BRUISING OR BLEEDING  TENDERNESS IN MOUTH AND THROAT WITH OR WITHOUT PRESENCE OF ULCERS  *URINARY PROBLEMS  *BOWEL PROBLEMS  UNUSUAL RASH Items with * indicate a potential emergency and should be followed up as soon as possible.  Feel free to call the clinic you have any questions or concerns. The clinic phone number is [phone].    

## 2013-02-13 NOTE — Progress Notes (Signed)
Patient is a 62 year old male diagnosed with lung cancer.  He is a patient of Dr. Shirline Frees.  Past medical history includes GERD, tobacco, and asthma.  Medications include Decadron, Zofran, and Compazine.  Labs include creatinine 1.4, and albumin 3.2 on October 8.  Height: 67 inches. Weight: 142.3 pounds Usual body weight 140 pounds. BMI: 22.28.  Patient denies nutritional side effects at this time.  He has not had any weight loss.  He reports he loves to eat and is not having any difficulties since beginning chemotherapy.  No nutrition diagnosis.  Patient educated on basic strategies for adequate calories and protein to promote weight maintenance.  Patient educated to try oral nutrition supplements if he experiences difficulty eating.  Contact information provided.  No followup scheduled at this time.

## 2013-02-14 ENCOUNTER — Telehealth: Payer: Self-pay | Admitting: *Deleted

## 2013-02-14 ENCOUNTER — Telehealth: Payer: Self-pay | Admitting: Internal Medicine
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
## 2013-02-14 NOTE — Telephone Encounter (Signed)
Per staff message and POF I have scheduled appts.  JMW  

## 2013-02-14 NOTE — Telephone Encounter (Signed)
s.w. pt and advised on OCT adn NOV appt.Marland KitchenMarland Kitchenpt will pick up sched at nxt visit

## 2013-02-14 NOTE — Patient Instructions (Signed)
Continue weekly labs as scheduled Followup in 3 weeks prior to your next scheduled cycle of chemotherapy 

## 2013-02-20 ENCOUNTER — Other Ambulatory Visit (HOSPITAL_BASED_OUTPATIENT_CLINIC_OR_DEPARTMENT_OTHER): Payer: Medicare Other | Admitting: Lab

## 2013-02-20 ENCOUNTER — Encounter (INDEPENDENT_AMBULATORY_CARE_PROVIDER_SITE_OTHER): Payer: Self-pay

## 2013-02-20 DIAGNOSIS — C3430 Malignant neoplasm of lower lobe, unspecified bronchus or lung: Secondary | ICD-10-CM

## 2013-02-20 LAB — CBC WITH DIFFERENTIAL/PLATELET
BASO%: 1.3 % (ref 0.0–2.0)
Eosinophils Absolute: 0 10*3/uL (ref 0.0–0.5)
HCT: 32.8 % — ABNORMAL LOW (ref 38.4–49.9)
HGB: 11.1 g/dL — ABNORMAL LOW (ref 13.0–17.1)
MCHC: 33.8 g/dL (ref 32.0–36.0)
MONO#: 0.1 10*3/uL (ref 0.1–0.9)
NEUT%: 27.9 % — ABNORMAL LOW (ref 39.0–75.0)
Platelets: 91 10*3/uL — ABNORMAL LOW (ref 140–400)
RBC: 3.49 10*6/uL — ABNORMAL LOW (ref 4.20–5.82)
RDW: 13.9 % (ref 11.0–14.6)
WBC: 2.5 10*3/uL — ABNORMAL LOW (ref 4.0–10.3)
lymph#: 1.6 10*3/uL (ref 0.9–3.3)

## 2013-02-20 LAB — COMPREHENSIVE METABOLIC PANEL (CC13)
ALT: 48 U/L (ref 0–55)
AST: 48 U/L — ABNORMAL HIGH (ref 5–34)
Anion Gap: 9 mEq/L (ref 3–11)
CO2: 24 mEq/L (ref 22–29)
Calcium: 9.8 mg/dL (ref 8.4–10.4)
Chloride: 101 mEq/L (ref 98–109)
Creatinine: 0.8 mg/dL (ref 0.7–1.3)
Glucose: 93 mg/dl (ref 70–140)
Potassium: 4.6 mEq/L (ref 3.5–5.1)
Sodium: 135 mEq/L — ABNORMAL LOW (ref 136–145)
Total Protein: 8.1 g/dL (ref 6.4–8.3)

## 2013-02-27 ENCOUNTER — Other Ambulatory Visit (HOSPITAL_BASED_OUTPATIENT_CLINIC_OR_DEPARTMENT_OTHER): Payer: Medicare Other | Admitting: Lab

## 2013-02-27 DIAGNOSIS — C3490 Malignant neoplasm of unspecified part of unspecified bronchus or lung: Secondary | ICD-10-CM

## 2013-02-27 LAB — CBC WITH DIFFERENTIAL/PLATELET
BASO%: 0.5 % (ref 0.0–2.0)
EOS%: 2.1 % (ref 0.0–7.0)
Eosinophils Absolute: 0.1 10*3/uL (ref 0.0–0.5)
MCH: 31.1 pg (ref 27.2–33.4)
MCHC: 32.7 g/dL (ref 32.0–36.0)
MCV: 95.1 fL (ref 79.3–98.0)
MONO%: 19.1 % — ABNORMAL HIGH (ref 0.0–14.0)
NEUT%: 26.4 % — ABNORMAL LOW (ref 39.0–75.0)
RDW: 14.2 % (ref 11.0–14.6)
lymph#: 2 10*3/uL (ref 0.9–3.3)

## 2013-02-27 LAB — COMPREHENSIVE METABOLIC PANEL (CC13)
Anion Gap: 8 mEq/L (ref 3–11)
BUN: 13.4 mg/dL (ref 7.0–26.0)
CO2: 25 mEq/L (ref 22–29)
Calcium: 10 mg/dL (ref 8.4–10.4)
Chloride: 107 mEq/L (ref 98–109)
Creatinine: 0.8 mg/dL (ref 0.7–1.3)
Glucose: 86 mg/dl (ref 70–140)

## 2013-03-06 ENCOUNTER — Other Ambulatory Visit (HOSPITAL_BASED_OUTPATIENT_CLINIC_OR_DEPARTMENT_OTHER): Payer: Medicare Other | Admitting: Lab

## 2013-03-06 ENCOUNTER — Other Ambulatory Visit: Payer: Medicare Other | Admitting: Lab

## 2013-03-06 ENCOUNTER — Ambulatory Visit (HOSPITAL_BASED_OUTPATIENT_CLINIC_OR_DEPARTMENT_OTHER): Payer: Medicare Other | Admitting: Physician Assistant

## 2013-03-06 ENCOUNTER — Encounter: Payer: Self-pay | Admitting: Physician Assistant

## 2013-03-06 ENCOUNTER — Ambulatory Visit (HOSPITAL_BASED_OUTPATIENT_CLINIC_OR_DEPARTMENT_OTHER): Payer: Medicare Other

## 2013-03-06 ENCOUNTER — Telehealth: Payer: Self-pay | Admitting: Internal Medicine

## 2013-03-06 DIAGNOSIS — R110 Nausea: Secondary | ICD-10-CM

## 2013-03-06 DIAGNOSIS — C3430 Malignant neoplasm of lower lobe, unspecified bronchus or lung: Secondary | ICD-10-CM

## 2013-03-06 DIAGNOSIS — Z5111 Encounter for antineoplastic chemotherapy: Secondary | ICD-10-CM

## 2013-03-06 LAB — COMPREHENSIVE METABOLIC PANEL (CC13)
Albumin: 3.9 g/dL (ref 3.5–5.0)
Alkaline Phosphatase: 123 U/L (ref 40–150)
Creatinine: 0.8 mg/dL (ref 0.7–1.3)
Glucose: 112 mg/dl (ref 70–140)
Potassium: 4.5 mEq/L (ref 3.5–5.1)
Sodium: 135 mEq/L — ABNORMAL LOW (ref 136–145)
Total Protein: 8.6 g/dL — ABNORMAL HIGH (ref 6.4–8.3)

## 2013-03-06 LAB — CBC WITH DIFFERENTIAL/PLATELET
Basophils Absolute: 0 10*3/uL (ref 0.0–0.1)
EOS%: 0 % (ref 0.0–7.0)
Eosinophils Absolute: 0 10*3/uL (ref 0.0–0.5)
HGB: 10.1 g/dL — ABNORMAL LOW (ref 13.0–17.1)
LYMPH%: 24 % (ref 14.0–49.0)
MCH: 31.5 pg (ref 27.2–33.4)
NEUT#: 3.7 10*3/uL (ref 1.5–6.5)
NEUT%: 70 % (ref 39.0–75.0)
Platelets: 375 10*3/uL (ref 140–400)
RDW: 15.1 % — ABNORMAL HIGH (ref 11.0–14.6)
WBC: 5.2 10*3/uL (ref 4.0–10.3)
lymph#: 1.3 10*3/uL (ref 0.9–3.3)

## 2013-03-06 MED ORDER — DEXAMETHASONE SODIUM PHOSPHATE 20 MG/5ML IJ SOLN
20.0000 mg | Freq: Once | INTRAMUSCULAR | Status: AC
  Administered 2013-03-06: 20 mg via INTRAVENOUS

## 2013-03-06 MED ORDER — SODIUM CHLORIDE 0.9 % IV SOLN
Freq: Once | INTRAVENOUS | Status: AC
  Administered 2013-03-06: 15:00:00 via INTRAVENOUS

## 2013-03-06 MED ORDER — CYANOCOBALAMIN 1000 MCG/ML IJ SOLN
1000.0000 ug | Freq: Once | INTRAMUSCULAR | Status: AC
  Administered 2013-03-06: 1000 ug via INTRAMUSCULAR

## 2013-03-06 MED ORDER — ONDANSETRON 16 MG/50ML IVPB (CHCC)
INTRAVENOUS | Status: AC
  Filled 2013-03-06: qty 16

## 2013-03-06 MED ORDER — SODIUM CHLORIDE 0.9 % IV SOLN
564.0000 mg | Freq: Once | INTRAVENOUS | Status: AC
  Administered 2013-03-06: 560 mg via INTRAVENOUS
  Filled 2013-03-06: qty 56

## 2013-03-06 MED ORDER — DEXAMETHASONE SODIUM PHOSPHATE 20 MG/5ML IJ SOLN
INTRAMUSCULAR | Status: AC
  Filled 2013-03-06: qty 5

## 2013-03-06 MED ORDER — CYANOCOBALAMIN 1000 MCG/ML IJ SOLN
INTRAMUSCULAR | Status: AC
  Filled 2013-03-06: qty 1

## 2013-03-06 MED ORDER — ONDANSETRON 16 MG/50ML IVPB (CHCC)
16.0000 mg | Freq: Once | INTRAVENOUS | Status: AC
  Administered 2013-03-06: 16 mg via INTRAVENOUS

## 2013-03-06 MED ORDER — SODIUM CHLORIDE 0.9 % IV SOLN
500.0000 mg/m2 | Freq: Once | INTRAVENOUS | Status: AC
  Administered 2013-03-06: 875 mg via INTRAVENOUS
  Filled 2013-03-06: qty 35

## 2013-03-06 NOTE — Telephone Encounter (Signed)
added 11/26 appt w [facility] per 11/5 POF gave pt cal and avs adv pt [facility] w call w CT appt [facility]

## 2013-03-06 NOTE — Patient Instructions (Signed)
[facility] Discharge Instructions for Patients Receiving Chemotherapy  Today you received the following chemotherapy agents Alimta,carboplatin To help prevent nausea and vomiting after your treatment, we encourage you to take your nausea medication    If you develop nausea and vomiting that is not controlled by your nausea medication, call the clinic.   BELOW ARE SYMPTOMS THAT SHOULD BE REPORTED IMMEDIATELY:  *FEVER GREATER THAN 100.5 F  *CHILLS WITH OR WITHOUT FEVER  NAUSEA AND VOMITING THAT IS NOT CONTROLLED WITH YOUR NAUSEA MEDICATION  *UNUSUAL SHORTNESS OF BREATH  *UNUSUAL BRUISING OR BLEEDING  TENDERNESS IN MOUTH AND THROAT WITH OR WITHOUT PRESENCE OF ULCERS  *URINARY PROBLEMS  *BOWEL PROBLEMS  UNUSUAL RASH Items with * indicate a potential emergency and should be followed up as soon as possible.  Feel free to call the clinic you have any questions or concerns. The clinic phone number is [phone].
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
## 2013-03-06 NOTE — Progress Notes (Signed)
Discharged at 1712 with spouse, ambulatory in no distress.

## 2013-03-07 NOTE — Patient Instructions (Signed)
Continue weekly labs as scheduled Follow-up in 3 weeks with a restaging CT scan of your chest to reevaluate your disease 

## 2013-03-13 ENCOUNTER — Other Ambulatory Visit: Payer: Medicare Other

## 2013-03-13 ENCOUNTER — Other Ambulatory Visit (HOSPITAL_BASED_OUTPATIENT_CLINIC_OR_DEPARTMENT_OTHER): Payer: Medicare Other

## 2013-03-13 DIAGNOSIS — C3430 Malignant neoplasm of lower lobe, unspecified bronchus or lung: Secondary | ICD-10-CM

## 2013-03-13 LAB — COMPREHENSIVE METABOLIC PANEL (CC13)
ALT: 57 U/L — ABNORMAL HIGH (ref 0–55)
Albumin: 3.5 g/dL (ref 3.5–5.0)
Alkaline Phosphatase: 120 U/L (ref 40–150)
Anion Gap: 8 mEq/L (ref 3–11)
BUN: 11 mg/dL (ref 7.0–26.0)
CO2: 25 mEq/L (ref 22–29)
Glucose: 111 mg/dl (ref 70–140)
Sodium: 135 mEq/L — ABNORMAL LOW (ref 136–145)
Total Bilirubin: 0.67 mg/dL (ref 0.20–1.20)
Total Protein: 7.4 g/dL (ref 6.4–8.3)

## 2013-03-13 LAB — CBC WITH DIFFERENTIAL/PLATELET
Basophils Absolute: 0 10*3/uL (ref 0.0–0.1)
Eosinophils Absolute: 0 10*3/uL (ref 0.0–0.5)
HCT: 29.8 % — ABNORMAL LOW (ref 38.4–49.9)
HGB: 10 g/dL — ABNORMAL LOW (ref 13.0–17.1)
LYMPH%: 60.1 % — ABNORMAL HIGH (ref 14.0–49.0)
MCH: 32 pg (ref 27.2–33.4)
MONO#: 0.2 10*3/uL (ref 0.1–0.9)
MONO%: 7.9 % (ref 0.0–14.0)
NEUT#: 0.7 10*3/uL — ABNORMAL LOW (ref 1.5–6.5)
Platelets: 106 10*3/uL — ABNORMAL LOW (ref 140–400)
RBC: 3.14 10*6/uL — ABNORMAL LOW (ref 4.20–5.82)
WBC: 2.5 10*3/uL — ABNORMAL LOW (ref 4.0–10.3)

## 2013-03-20 ENCOUNTER — Other Ambulatory Visit (HOSPITAL_BASED_OUTPATIENT_CLINIC_OR_DEPARTMENT_OTHER): Payer: Medicare Other | Admitting: Lab

## 2013-03-20 DIAGNOSIS — C3430 Malignant neoplasm of lower lobe, unspecified bronchus or lung: Secondary | ICD-10-CM

## 2013-03-20 LAB — CBC WITH DIFFERENTIAL/PLATELET
BASO%: 0.8 % (ref 0.0–2.0)
HCT: 30.6 % — ABNORMAL LOW (ref 38.4–49.9)
MCH: 32.6 pg (ref 27.2–33.4)
MCHC: 33.3 g/dL (ref 32.0–36.0)
MONO#: 0.6 10*3/uL (ref 0.1–0.9)
RBC: 3.12 10*6/uL — ABNORMAL LOW (ref 4.20–5.82)
RDW: 16 % — ABNORMAL HIGH (ref 11.0–14.6)
WBC: 3.7 10*3/uL — ABNORMAL LOW (ref 4.0–10.3)
lymph#: 1.6 10*3/uL (ref 0.9–3.3)

## 2013-03-20 LAB — COMPREHENSIVE METABOLIC PANEL (CC13)
ALT: 53 U/L (ref 0–55)
Albumin: 3.6 g/dL (ref 3.5–5.0)
Anion Gap: 10 mEq/L (ref 3–11)
CO2: 23 mEq/L (ref 22–29)
Calcium: 10 mg/dL (ref 8.4–10.4)
Chloride: 106 mEq/L (ref 98–109)
Potassium: 4.5 mEq/L (ref 3.5–5.1)
Sodium: 139 mEq/L (ref 136–145)
Total Protein: 8 g/dL (ref 6.4–8.3)

## 2013-03-25 ENCOUNTER — Encounter (HOSPITAL_COMMUNITY): Payer: Self-pay

## 2013-03-25 ENCOUNTER — Ambulatory Visit (HOSPITAL_COMMUNITY)
Admission: RE | Admit: 2013-03-25 | Discharge: 2013-03-25 | Disposition: A | Discharge status: Home / Self Care | Payer: Medicare Other | Source: Ambulatory Visit | Attending: Physician Assistant | Admitting: Physician Assistant

## 2013-03-25 DIAGNOSIS — R911 Solitary pulmonary nodule: Secondary | ICD-10-CM | POA: Insufficient documentation

## 2013-03-25 DIAGNOSIS — Z79899 Other long term (current) drug therapy: Secondary | ICD-10-CM | POA: Insufficient documentation

## 2013-03-25 DIAGNOSIS — C3490 Malignant neoplasm of unspecified part of unspecified bronchus or lung: Secondary | ICD-10-CM | POA: Insufficient documentation

## 2013-03-25 DIAGNOSIS — R599 Enlarged lymph nodes, unspecified: Secondary | ICD-10-CM | POA: Insufficient documentation

## 2013-03-25 DIAGNOSIS — I2510 Atherosclerotic heart disease of native coronary artery without angina pectoris: Secondary | ICD-10-CM | POA: Insufficient documentation

## 2013-03-25 DIAGNOSIS — K7689 Other specified diseases of liver: Secondary | ICD-10-CM | POA: Insufficient documentation

## 2013-03-25 MED ORDER — IOHEXOL 300 MG/ML  SOLN
80.0000 mL | Freq: Once | INTRAMUSCULAR | Status: AC | PRN
  Administered 2013-03-25: 80 mL via INTRAVENOUS

## 2013-03-27 ENCOUNTER — Encounter: Payer: Self-pay | Admitting: Internal Medicine

## 2013-03-27 ENCOUNTER — Other Ambulatory Visit (HOSPITAL_BASED_OUTPATIENT_CLINIC_OR_DEPARTMENT_OTHER): Payer: Medicare Other

## 2013-03-27 ENCOUNTER — Ambulatory Visit (HOSPITAL_BASED_OUTPATIENT_CLINIC_OR_DEPARTMENT_OTHER): Payer: Medicare Other

## 2013-03-27 ENCOUNTER — Telehealth: Payer: Self-pay | Admitting: Internal Medicine

## 2013-03-27 ENCOUNTER — Other Ambulatory Visit: Payer: Medicare Other | Admitting: Lab

## 2013-03-27 ENCOUNTER — Ambulatory Visit (HOSPITAL_BASED_OUTPATIENT_CLINIC_OR_DEPARTMENT_OTHER): Payer: Medicare Other | Admitting: Internal Medicine

## 2013-03-27 DIAGNOSIS — Z5111 Encounter for antineoplastic chemotherapy: Secondary | ICD-10-CM

## 2013-03-27 DIAGNOSIS — C3490 Malignant neoplasm of unspecified part of unspecified bronchus or lung: Secondary | ICD-10-CM

## 2013-03-27 DIAGNOSIS — C3430 Malignant neoplasm of lower lobe, unspecified bronchus or lung: Secondary | ICD-10-CM

## 2013-03-27 LAB — CBC WITH DIFFERENTIAL/PLATELET
BASO%: 0.1 % (ref 0.0–2.0)
Basophils Absolute: 0 10*3/uL (ref 0.0–0.1)
EOS%: 0 % (ref 0.0–7.0)
HGB: 10.8 g/dL — ABNORMAL LOW (ref 13.0–17.1)
MCH: 32.7 pg (ref 27.2–33.4)
MCV: 98.8 fL — ABNORMAL HIGH (ref 79.3–98.0)
MONO#: 0.6 10*3/uL (ref 0.1–0.9)
MONO%: 8.3 % (ref 0.0–14.0)
Platelets: 314 10*3/uL (ref 140–400)
RBC: 3.3 10*6/uL — ABNORMAL LOW (ref 4.20–5.82)
RDW: 16.8 % — ABNORMAL HIGH (ref 11.0–14.6)
lymph#: 1 10*3/uL (ref 0.9–3.3)
nRBC: 0 % (ref 0–0)

## 2013-03-27 LAB — COMPREHENSIVE METABOLIC PANEL (CC13)
ALT: 40 U/L (ref 0–55)
AST: 46 U/L — ABNORMAL HIGH (ref 5–34)
Albumin: 3.8 g/dL (ref 3.5–5.0)
Alkaline Phosphatase: 138 U/L (ref 40–150)
Glucose: 148 mg/dl — ABNORMAL HIGH (ref 70–140)
Potassium: 4.5 mEq/L (ref 3.5–5.1)
Sodium: 135 mEq/L — ABNORMAL LOW (ref 136–145)
Total Bilirubin: 0.4 mg/dL (ref 0.20–1.20)
Total Protein: 8.8 g/dL — ABNORMAL HIGH (ref 6.4–8.3)

## 2013-03-27 MED ORDER — SODIUM CHLORIDE 0.9 % IV SOLN
500.0000 mg/m2 | Freq: Once | INTRAVENOUS | Status: AC
  Administered 2013-03-27: 875 mg via INTRAVENOUS
  Filled 2013-03-27: qty 35

## 2013-03-27 MED ORDER — ONDANSETRON 16 MG/50ML IVPB (CHCC)
16.0000 mg | Freq: Once | INTRAVENOUS | Status: AC
  Administered 2013-03-27: 16 mg via INTRAVENOUS

## 2013-03-27 MED ORDER — SODIUM CHLORIDE 0.9 % IV SOLN
564.0000 mg | Freq: Once | INTRAVENOUS | Status: AC
  Administered 2013-03-27: 560 mg via INTRAVENOUS
  Filled 2013-03-27: qty 56

## 2013-03-27 MED ORDER — SODIUM CHLORIDE 0.9 % IV SOLN
Freq: Once | INTRAVENOUS | Status: AC
  Administered 2013-03-27: 15:00:00 via INTRAVENOUS

## 2013-03-27 MED ORDER — ONDANSETRON 16 MG/50ML IVPB (CHCC)
INTRAVENOUS | Status: AC
  Filled 2013-03-27: qty 16

## 2013-03-27 MED ORDER — DEXAMETHASONE SODIUM PHOSPHATE 20 MG/5ML IJ SOLN
INTRAMUSCULAR | Status: AC
  Filled 2013-03-27: qty 5

## 2013-03-27 MED ORDER — DEXAMETHASONE SODIUM PHOSPHATE 20 MG/5ML IJ SOLN
20.0000 mg | Freq: Once | INTRAMUSCULAR | Status: AC
  Administered 2013-03-27: 20 mg via INTRAVENOUS
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
## 2013-03-27 NOTE — Telephone Encounter (Signed)
gave pt apppt for lab and MD , emailed michelle regarding chemo for december

## 2013-03-27 NOTE — Patient Instructions (Signed)
[facility] Discharge Instructions for Patients Receiving Chemotherapy  Today you received the following chemotherapy agents carboplatin , alimta  To help prevent nausea and vomiting after your treatment, we encourage you to take your nausea medication as ordered. If you develop nausea and vomiting that is not controlled by your nausea medication, call the clinic.   BELOW ARE SYMPTOMS THAT SHOULD BE REPORTED IMMEDIATELY:  *FEVER GREATER THAN 100.5 F  *CHILLS WITH OR WITHOUT FEVER  NAUSEA AND VOMITING THAT IS NOT CONTROLLED WITH YOUR NAUSEA MEDICATION  *UNUSUAL SHORTNESS OF BREATH  *UNUSUAL BRUISING OR BLEEDING  TENDERNESS IN MOUTH AND THROAT WITH OR WITHOUT PRESENCE OF ULCERS  *URINARY PROBLEMS  *BOWEL PROBLEMS  UNUSUAL RASH Items with * indicate a potential emergency and should be followed up as soon as possible.  Feel free to call the clinic you have any questions or concerns. The clinic phone number is [phone].

## 2013-03-29 ENCOUNTER — Telehealth: Payer: Self-pay | Admitting: *Deleted

## 2013-03-29 NOTE — Patient Instructions (Signed)
Your scan showed improvement in your condition. Continue chemotherapy with carboplatin and Alimta. Follow up visit in 3 weeks with the next cycle of chemotherapy

## 2013-03-29 NOTE — Telephone Encounter (Signed)
Per staff message and POF I have scheduled appts.  JMW  

## 2013-04-01 ENCOUNTER — Telehealth: Payer: Self-pay | Admitting: Internal Medicine

## 2013-04-01 NOTE — Telephone Encounter (Signed)
Talked to pt and gave him appt for lab,md and chemo for december 2014

## 2013-04-03 ENCOUNTER — Encounter: Payer: Self-pay | Admitting: Nutrition

## 2013-04-03 ENCOUNTER — Telehealth: Payer: Self-pay | Admitting: Internal Medicine

## 2013-04-03 ENCOUNTER — Other Ambulatory Visit (HOSPITAL_BASED_OUTPATIENT_CLINIC_OR_DEPARTMENT_OTHER): Payer: Medicare Other | Admitting: Lab

## 2013-04-03 DIAGNOSIS — C3430 Malignant neoplasm of lower lobe, unspecified bronchus or lung: Secondary | ICD-10-CM

## 2013-04-03 DIAGNOSIS — R110 Nausea: Secondary | ICD-10-CM

## 2013-04-03 LAB — CBC WITH DIFFERENTIAL/PLATELET
BASO%: 1.2 % (ref 0.0–2.0)
HCT: 29.4 % — ABNORMAL LOW (ref 38.4–49.9)
HGB: 10 g/dL — ABNORMAL LOW (ref 13.0–17.1)
LYMPH%: 57 % — ABNORMAL HIGH (ref 14.0–49.0)
MCH: 34.4 pg — ABNORMAL HIGH (ref 27.2–33.4)
MCHC: 33.9 g/dL (ref 32.0–36.0)
MCV: 101.3 fL — ABNORMAL HIGH (ref 79.3–98.0)
MONO#: 0.3 10*3/uL (ref 0.1–0.9)
MONO%: 10.1 % (ref 0.0–14.0)
NEUT%: 30 % — ABNORMAL LOW (ref 39.0–75.0)
Platelets: 68 10*3/uL — ABNORMAL LOW (ref 140–400)
RBC: 2.9 10*6/uL — ABNORMAL LOW (ref 4.20–5.82)
WBC: 2.6 10*3/uL — ABNORMAL LOW (ref 4.0–10.3)

## 2013-04-03 LAB — COMPREHENSIVE METABOLIC PANEL (CC13)
ALT: 58 U/L — ABNORMAL HIGH (ref 0–55)
Alkaline Phosphatase: 131 U/L (ref 40–150)
Anion Gap: 9 mEq/L (ref 3–11)
CO2: 26 mEq/L (ref 22–29)
Calcium: 10 mg/dL (ref 8.4–10.4)
Creatinine: 0.8 mg/dL (ref 0.7–1.3)
Potassium: 4.7 mEq/L (ref 3.5–5.1)
Sodium: 137 mEq/L (ref 136–145)
Total Bilirubin: 0.54 mg/dL (ref 0.20–1.20)
Total Protein: 7.8 g/dL (ref 6.4–8.3)

## 2013-04-03 NOTE — Progress Notes (Signed)
Patient provided with one complimentary case of Ensure Plus.

## 2013-04-03 NOTE — Telephone Encounter (Signed)
pt came by and picked up Dec calendar print out shh

## 2013-04-10 ENCOUNTER — Other Ambulatory Visit (HOSPITAL_BASED_OUTPATIENT_CLINIC_OR_DEPARTMENT_OTHER): Payer: Medicare Other

## 2013-04-10 ENCOUNTER — Telehealth: Payer: Self-pay | Admitting: Internal Medicine

## 2013-04-10 ENCOUNTER — Encounter (INDEPENDENT_AMBULATORY_CARE_PROVIDER_SITE_OTHER): Payer: Self-pay

## 2013-04-10 DIAGNOSIS — C3430 Malignant neoplasm of lower lobe, unspecified bronchus or lung: Secondary | ICD-10-CM

## 2013-04-10 LAB — COMPREHENSIVE METABOLIC PANEL (CC13)
ALT: 54 U/L (ref 0–55)
Albumin: 3.6 g/dL (ref 3.5–5.0)
Anion Gap: 10 mEq/L (ref 3–11)
BUN: 9.3 mg/dL (ref 7.0–26.0)
CO2: 25 mEq/L (ref 22–29)
Calcium: 10 mg/dL (ref 8.4–10.4)
Chloride: 104 mEq/L (ref 98–109)
Creatinine: 0.8 mg/dL (ref 0.7–1.3)
Glucose: 124 mg/dl (ref 70–140)
Potassium: 4.2 mEq/L (ref 3.5–5.1)

## 2013-04-10 LAB — CBC WITH DIFFERENTIAL/PLATELET
BASO%: 0.8 % (ref 0.0–2.0)
Basophils Absolute: 0 10*3/uL (ref 0.0–0.1)
HCT: 30.1 % — ABNORMAL LOW (ref 38.4–49.9)
HGB: 10.3 g/dL — ABNORMAL LOW (ref 13.0–17.1)
LYMPH%: 42.2 % (ref 14.0–49.0)
MCHC: 34 g/dL (ref 32.0–36.0)
MCV: 101.7 fL — ABNORMAL HIGH (ref 79.3–98.0)
MONO#: 0.7 10*3/uL (ref 0.1–0.9)
NEUT%: 38.3 % — ABNORMAL LOW (ref 39.0–75.0)
Platelets: 112 10*3/uL — ABNORMAL LOW (ref 140–400)
RBC: 2.96 10*6/uL — ABNORMAL LOW (ref 4.20–5.82)
WBC: 4.1 10*3/uL (ref 4.0–10.3)
lymph#: 1.7 10*3/uL (ref 0.9–3.3)

## 2013-04-10 NOTE — Telephone Encounter (Signed)
PT CAME IN SIGNED RELEASE FORM REQUESTING ALL MEDICAL RECORDS.  ROI SCANNED.

## 2013-04-11 ENCOUNTER — Other Ambulatory Visit: Payer: Medicare Other

## 2013-04-17 ENCOUNTER — Other Ambulatory Visit: Payer: Self-pay | Admitting: *Deleted

## 2013-04-17 ENCOUNTER — Other Ambulatory Visit (HOSPITAL_BASED_OUTPATIENT_CLINIC_OR_DEPARTMENT_OTHER): Payer: Medicare Other

## 2013-04-17 ENCOUNTER — Ambulatory Visit (HOSPITAL_BASED_OUTPATIENT_CLINIC_OR_DEPARTMENT_OTHER): Payer: Medicare Other | Admitting: Physician Assistant

## 2013-04-17 ENCOUNTER — Telehealth: Payer: Self-pay | Admitting: Internal Medicine

## 2013-04-17 ENCOUNTER — Ambulatory Visit (HOSPITAL_BASED_OUTPATIENT_CLINIC_OR_DEPARTMENT_OTHER): Payer: Medicare Other

## 2013-04-17 DIAGNOSIS — R110 Nausea: Secondary | ICD-10-CM

## 2013-04-17 DIAGNOSIS — C3430 Malignant neoplasm of lower lobe, unspecified bronchus or lung: Secondary | ICD-10-CM

## 2013-04-17 DIAGNOSIS — C3490 Malignant neoplasm of unspecified part of unspecified bronchus or lung: Secondary | ICD-10-CM

## 2013-04-17 DIAGNOSIS — Z5111 Encounter for antineoplastic chemotherapy: Secondary | ICD-10-CM

## 2013-04-17 LAB — CBC WITH DIFFERENTIAL/PLATELET
Basophils Absolute: 0 10*3/uL (ref 0.0–0.1)
EOS%: 0 % (ref 0.0–7.0)
Eosinophils Absolute: 0 10*3/uL (ref 0.0–0.5)
HGB: 10 g/dL — ABNORMAL LOW (ref 13.0–17.1)
MCH: 33.6 pg — ABNORMAL HIGH (ref 27.2–33.4)
MCHC: 33.9 g/dL (ref 32.0–36.0)
MCV: 99 fL — ABNORMAL HIGH (ref 79.3–98.0)
MONO#: 0.5 10*3/uL (ref 0.1–0.9)
NEUT#: 3.9 10*3/uL (ref 1.5–6.5)
RDW: 16.6 % — ABNORMAL HIGH (ref 11.0–14.6)
WBC: 5.7 10*3/uL (ref 4.0–10.3)
lymph#: 1.4 10*3/uL (ref 0.9–3.3)

## 2013-04-17 LAB — COMPREHENSIVE METABOLIC PANEL (CC13)
AST: 55 U/L — ABNORMAL HIGH (ref 5–34)
Albumin: 3.9 g/dL (ref 3.5–5.0)
Anion Gap: 9 mEq/L (ref 3–11)
BUN: 20.8 mg/dL (ref 7.0–26.0)
Calcium: 10.3 mg/dL (ref 8.4–10.4)
Chloride: 101 mEq/L (ref 98–109)
Glucose: 128 mg/dl (ref 70–140)
Potassium: 4.6 mEq/L (ref 3.5–5.1)
Sodium: 134 mEq/L — ABNORMAL LOW (ref 136–145)
Total Bilirubin: 0.43 mg/dL (ref 0.20–1.20)

## 2013-04-17 MED ORDER — SODIUM CHLORIDE 0.9 % IV SOLN
564.0000 mg | Freq: Once | INTRAVENOUS | Status: AC
  Administered 2013-04-17: 560 mg via INTRAVENOUS
  Filled 2013-04-17: qty 56

## 2013-04-17 MED ORDER — DEXAMETHASONE SODIUM PHOSPHATE 20 MG/5ML IJ SOLN
INTRAMUSCULAR | Status: AC
  Filled 2013-04-17: qty 5

## 2013-04-17 MED ORDER — SODIUM CHLORIDE 0.9 % IV SOLN
Freq: Once | INTRAVENOUS | Status: AC
  Administered 2013-04-17: 13:00:00 via INTRAVENOUS

## 2013-04-17 MED ORDER — DEXAMETHASONE SODIUM PHOSPHATE 20 MG/5ML IJ SOLN
20.0000 mg | Freq: Once | INTRAMUSCULAR | Status: AC
  Administered 2013-04-17: 20 mg via INTRAVENOUS

## 2013-04-17 MED ORDER — ONDANSETRON 16 MG/50ML IVPB (CHCC)
16.0000 mg | Freq: Once | INTRAVENOUS | Status: AC
  Administered 2013-04-17: 16 mg via INTRAVENOUS

## 2013-04-17 MED ORDER — ONDANSETRON 16 MG/50ML IVPB (CHCC)
INTRAVENOUS | Status: AC
  Filled 2013-04-17: qty 16

## 2013-04-17 MED ORDER — SODIUM CHLORIDE 0.9 % IV SOLN
500.0000 mg/m2 | Freq: Once | INTRAVENOUS | Status: AC
  Administered 2013-04-17: 875 mg via INTRAVENOUS
  Filled 2013-04-17: qty 35

## 2013-04-17 NOTE — Telephone Encounter (Signed)
appts made per 12/17 POF AVS and CAL printed shh °

## 2013-04-17 NOTE — Patient Instructions (Signed)
[facility] Discharge Instructions for Patients Receiving Chemotherapy  Today you received the following chemotherapy agents Alimta and Carboplatin.  To help prevent nausea and vomiting after your treatment, we encourage you to take your nausea medication as prescribed.   If you develop nausea and vomiting that is not controlled by your nausea medication, call the clinic.   BELOW ARE SYMPTOMS THAT SHOULD BE REPORTED IMMEDIATELY:  *FEVER GREATER THAN 100.5 F  *CHILLS WITH OR WITHOUT FEVER  NAUSEA AND VOMITING THAT IS NOT CONTROLLED WITH YOUR NAUSEA MEDICATION  *UNUSUAL SHORTNESS OF BREATH  *UNUSUAL BRUISING OR BLEEDING  TENDERNESS IN MOUTH AND THROAT WITH OR WITHOUT PRESENCE OF ULCERS  *URINARY PROBLEMS  *BOWEL PROBLEMS  UNUSUAL RASH Items with * indicate a potential emergency and should be followed up as soon as possible.  Feel free to call the clinic you have any questions or concerns. The clinic phone number is [phone].    

## 2013-04-18 ENCOUNTER — Encounter: Payer: Self-pay | Admitting: Physician Assistant
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
## 2013-04-21 NOTE — Patient Instructions (Signed)
Continue with weekly labs as scheduled Followup with Dr. Arbutus Ped in 3 weeks prior to next scheduled cycle of chemotherapy

## 2013-04-24 ENCOUNTER — Other Ambulatory Visit (HOSPITAL_BASED_OUTPATIENT_CLINIC_OR_DEPARTMENT_OTHER): Payer: Medicare Other

## 2013-04-24 DIAGNOSIS — C3430 Malignant neoplasm of lower lobe, unspecified bronchus or lung: Secondary | ICD-10-CM

## 2013-04-24 LAB — CBC WITH DIFFERENTIAL/PLATELET
Basophils Absolute: 0 10*3/uL (ref 0.0–0.1)
EOS%: 1.4 % (ref 0.0–7.0)
Eosinophils Absolute: 0 10*3/uL (ref 0.0–0.5)
HGB: 9.7 g/dL — ABNORMAL LOW (ref 13.0–17.1)
MCH: 32.1 pg (ref 27.2–33.4)
MCV: 101.9 fL — ABNORMAL HIGH (ref 79.3–98.0)
MONO#: 0.3 10*3/uL (ref 0.1–0.9)
MONO%: 11.2 % (ref 0.0–14.0)
NEUT#: 0.8 10*3/uL — ABNORMAL LOW (ref 1.5–6.5)
RBC: 3.02 10*6/uL — ABNORMAL LOW (ref 4.20–5.82)
RDW: 16.1 % — ABNORMAL HIGH (ref 11.0–14.6)
lymph#: 1.4 10*3/uL (ref 0.9–3.3)

## 2013-04-24 LAB — COMPREHENSIVE METABOLIC PANEL (CC13)
AST: 75 U/L — ABNORMAL HIGH (ref 5–34)
Albumin: 3.6 g/dL (ref 3.5–5.0)
Alkaline Phosphatase: 150 U/L (ref 40–150)
CO2: 25 mEq/L (ref 22–29)
Calcium: 9.8 mg/dL (ref 8.4–10.4)
Potassium: 4.5 mEq/L (ref 3.5–5.1)
Sodium: 137 mEq/L (ref 136–145)
Total Protein: 7.9 g/dL (ref 6.4–8.3)

## 2013-05-01 ENCOUNTER — Encounter (INDEPENDENT_AMBULATORY_CARE_PROVIDER_SITE_OTHER): Payer: Self-pay

## 2013-05-01 ENCOUNTER — Other Ambulatory Visit (HOSPITAL_BASED_OUTPATIENT_CLINIC_OR_DEPARTMENT_OTHER): Payer: Medicare Other

## 2013-05-01 DIAGNOSIS — C3430 Malignant neoplasm of lower lobe, unspecified bronchus or lung: Secondary | ICD-10-CM

## 2013-05-01 LAB — COMPREHENSIVE METABOLIC PANEL (CC13)
AST: 56 U/L — ABNORMAL HIGH (ref 5–34)
Albumin: 3.7 g/dL (ref 3.5–5.0)
Alkaline Phosphatase: 149 U/L (ref 40–150)
Creatinine: 0.8 mg/dL (ref 0.7–1.3)
Glucose: 108 mg/dl (ref 70–140)
Sodium: 137 mEq/L (ref 136–145)
Total Bilirubin: 0.47 mg/dL (ref 0.20–1.20)
Total Protein: 8.1 g/dL (ref 6.4–8.3)

## 2013-05-01 LAB — CBC WITH DIFFERENTIAL/PLATELET
BASO%: 0.6 % (ref 0.0–2.0)
Eosinophils Absolute: 0.1 10*3/uL (ref 0.0–0.5)
HGB: 10.4 g/dL — ABNORMAL LOW (ref 13.0–17.1)
LYMPH%: 37.7 % (ref 14.0–49.0)
MCHC: 33.2 g/dL (ref 32.0–36.0)
MCV: 104.9 fL — ABNORMAL HIGH (ref 79.3–98.0)
MONO%: 16.1 % — ABNORMAL HIGH (ref 0.0–14.0)
NEUT%: 44.1 % (ref 39.0–75.0)
Platelets: 124 10*3/uL — ABNORMAL LOW (ref 140–400)
RBC: 2.98 10*6/uL — ABNORMAL LOW (ref 4.20–5.82)
WBC: 4.5 10*3/uL (ref 4.0–10.3)

## 2013-05-06 ENCOUNTER — Ambulatory Visit (HOSPITAL_BASED_OUTPATIENT_CLINIC_OR_DEPARTMENT_OTHER): Payer: Medicare Other | Admitting: Internal Medicine

## 2013-05-06 ENCOUNTER — Encounter: Payer: Self-pay | Admitting: Internal Medicine

## 2013-05-06 ENCOUNTER — Telehealth: Payer: Self-pay | Admitting: Internal Medicine

## 2013-05-06 ENCOUNTER — Other Ambulatory Visit (HOSPITAL_BASED_OUTPATIENT_CLINIC_OR_DEPARTMENT_OTHER): Payer: Medicare Other

## 2013-05-06 ENCOUNTER — Encounter (INDEPENDENT_AMBULATORY_CARE_PROVIDER_SITE_OTHER): Payer: Self-pay

## 2013-05-06 DIAGNOSIS — C3430 Malignant neoplasm of lower lobe, unspecified bronchus or lung: Secondary | ICD-10-CM

## 2013-05-06 LAB — CBC WITH DIFFERENTIAL/PLATELET
BASO%: 0.7 % (ref 0.0–2.0)
Basophils Absolute: 0 10*3/uL (ref 0.0–0.1)
EOS%: 2.2 % (ref 0.0–7.0)
Eosinophils Absolute: 0.1 10*3/uL (ref 0.0–0.5)
HCT: 31.6 % — ABNORMAL LOW (ref 38.4–49.9)
HGB: 10.8 g/dL — ABNORMAL LOW (ref 13.0–17.1)
LYMPH%: 37.6 % (ref 14.0–49.0)
MCH: 35.9 pg — ABNORMAL HIGH (ref 27.2–33.4)
MCHC: 34.1 g/dL (ref 32.0–36.0)
MCV: 105.1 fL — AB (ref 79.3–98.0)
MONO#: 0.7 10*3/uL (ref 0.1–0.9)
MONO%: 14 % (ref 0.0–14.0)
NEUT%: 45.5 % (ref 39.0–75.0)
NEUTROS ABS: 2.1 10*3/uL (ref 1.5–6.5)
Platelets: 257 10*3/uL (ref 140–400)
RBC: 3.01 10*6/uL — AB (ref 4.20–5.82)
RDW: 17.1 % — AB (ref 11.0–14.6)
WBC: 4.7 10*3/uL (ref 4.0–10.3)
lymph#: 1.8 10*3/uL (ref 0.9–3.3)

## 2013-05-06 LAB — COMPREHENSIVE METABOLIC PANEL (CC13)
ALBUMIN: 3.8 g/dL (ref 3.5–5.0)
ALK PHOS: 163 U/L — AB (ref 40–150)
ALT: 44 U/L (ref 0–55)
AST: 51 U/L — AB (ref 5–34)
Anion Gap: 11 mEq/L (ref 3–11)
BUN: 8.6 mg/dL (ref 7.0–26.0)
CO2: 25 mEq/L (ref 22–29)
Calcium: 9.8 mg/dL (ref 8.4–10.4)
Chloride: 102 mEq/L (ref 98–109)
Creatinine: 0.8 mg/dL (ref 0.7–1.3)
Glucose: 115 mg/dl (ref 70–140)
POTASSIUM: 4.2 meq/L (ref 3.5–5.1)
Sodium: 138 mEq/L (ref 136–145)
TOTAL PROTEIN: 8.2 g/dL (ref 6.4–8.3)
Total Bilirubin: 0.37 mg/dL (ref 0.20–1.20)

## 2013-05-06 NOTE — Telephone Encounter (Signed)
Gave pt appt for lab and Md on January 28th 2015

## 2013-05-06 NOTE — Patient Instructions (Signed)
CURRENT THERAPY: Neoadjuvant chemotherapy with carboplatin for an AUC of 5 and Alimta at 500 mg per meter squared given every 3 weeks for a total of 3 cycles. Status post 5 cycles  DISEASE STAGE:  Lung cancer  Primary site: Lung (Right)  Staging method: AJCC 7th Edition  Clinical: Stage IIIA (T3, N2, M0) signed by Curt Bears, MD on 01/17/2013 2:08 PM  Summary: Stage IIIA (T3, N2, M0)  CHEMOTHERAPY INTENT: control/curative  CURRENT # OF CHEMOTHERAPY CYCLES: 6  CURRENT ANTIEMETICS: Zofran, dexamethasone and Compazine  CURRENT SMOKING STATUS: Former smoker  ORAL CHEMOTHERAPY AND CONSENT: None  CURRENT BISPHOSPHONATES USE: None  PAIN MANAGEMENT: 0/10  NARCOTICS INDUCED CONSTIPATION: None  LIVING WILL AND CODE STATUS: Full code
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
## 2013-05-08 ENCOUNTER — Other Ambulatory Visit (HOSPITAL_BASED_OUTPATIENT_CLINIC_OR_DEPARTMENT_OTHER): Payer: Medicare Other

## 2013-05-08 ENCOUNTER — Ambulatory Visit (HOSPITAL_BASED_OUTPATIENT_CLINIC_OR_DEPARTMENT_OTHER): Payer: Medicare Other

## 2013-05-08 ENCOUNTER — Other Ambulatory Visit: Payer: Medicare Other

## 2013-05-08 ENCOUNTER — Encounter: Payer: Self-pay | Admitting: Nutrition

## 2013-05-08 DIAGNOSIS — Z5111 Encounter for antineoplastic chemotherapy: Secondary | ICD-10-CM

## 2013-05-08 DIAGNOSIS — C3430 Malignant neoplasm of lower lobe, unspecified bronchus or lung: Secondary | ICD-10-CM

## 2013-05-08 LAB — CBC WITH DIFFERENTIAL/PLATELET
BASO%: 0.2 % (ref 0.0–2.0)
Basophils Absolute: 0 10*3/uL (ref 0.0–0.1)
EOS%: 0 % (ref 0.0–7.0)
Eosinophils Absolute: 0 10*3/uL (ref 0.0–0.5)
HCT: 30.5 % — ABNORMAL LOW (ref 38.4–49.9)
HGB: 9.9 g/dL — ABNORMAL LOW (ref 13.0–17.1)
LYMPH%: 23.8 % (ref 14.0–49.0)
MCH: 33.2 pg (ref 27.2–33.4)
MCHC: 32.5 g/dL (ref 32.0–36.0)
MCV: 102.3 fL — AB (ref 79.3–98.0)
MONO#: 0.4 10*3/uL (ref 0.1–0.9)
MONO%: 8.4 % (ref 0.0–14.0)
NEUT#: 3 10*3/uL (ref 1.5–6.5)
NEUT%: 67.6 % (ref 39.0–75.0)
Platelets: 305 10*3/uL (ref 140–400)
RBC: 2.98 10*6/uL — AB (ref 4.20–5.82)
RDW: 16 % — ABNORMAL HIGH (ref 11.0–14.6)
WBC: 4.5 10*3/uL (ref 4.0–10.3)
lymph#: 1.1 10*3/uL (ref 0.9–3.3)

## 2013-05-08 LAB — COMPREHENSIVE METABOLIC PANEL (CC13)
ALK PHOS: 149 U/L (ref 40–150)
ALT: 40 U/L (ref 0–55)
AST: 54 U/L — ABNORMAL HIGH (ref 5–34)
Albumin: 4 g/dL (ref 3.5–5.0)
Anion Gap: 11 mEq/L (ref 3–11)
BILIRUBIN TOTAL: 0.47 mg/dL (ref 0.20–1.20)
BUN: 22.9 mg/dL (ref 7.0–26.0)
CO2: 22 mEq/L (ref 22–29)
Calcium: 10.2 mg/dL (ref 8.4–10.4)
Chloride: 104 mEq/L (ref 98–109)
Creatinine: 1.1 mg/dL (ref 0.7–1.3)
GLUCOSE: 110 mg/dL (ref 70–140)
Potassium: 4.7 mEq/L (ref 3.5–5.1)
SODIUM: 137 meq/L (ref 136–145)
Total Protein: 8.6 g/dL — ABNORMAL HIGH (ref 6.4–8.3)

## 2013-05-08 MED ORDER — CYANOCOBALAMIN 1000 MCG/ML IJ SOLN
INTRAMUSCULAR | Status: AC
  Filled 2013-05-08: qty 1

## 2013-05-08 MED ORDER — SODIUM CHLORIDE 0.9 % IV SOLN
564.0000 mg | Freq: Once | INTRAVENOUS | Status: AC
  Administered 2013-05-08: 560 mg via INTRAVENOUS
  Filled 2013-05-08: qty 56

## 2013-05-08 MED ORDER — DEXAMETHASONE SODIUM PHOSPHATE 20 MG/5ML IJ SOLN
INTRAMUSCULAR | Status: AC
  Filled 2013-05-08: qty 5

## 2013-05-08 MED ORDER — DEXAMETHASONE SODIUM PHOSPHATE 20 MG/5ML IJ SOLN
20.0000 mg | Freq: Once | INTRAMUSCULAR | Status: AC
  Administered 2013-05-08: 20 mg via INTRAVENOUS

## 2013-05-08 MED ORDER — SODIUM CHLORIDE 0.9 % IV SOLN
Freq: Once | INTRAVENOUS | Status: AC
  Administered 2013-05-08: 09:00:00 via INTRAVENOUS

## 2013-05-08 MED ORDER — SODIUM CHLORIDE 0.9 % IV SOLN
500.0000 mg/m2 | Freq: Once | INTRAVENOUS | Status: AC
  Administered 2013-05-08: 875 mg via INTRAVENOUS
  Filled 2013-05-08: qty 35

## 2013-05-08 MED ORDER — ONDANSETRON 16 MG/50ML IVPB (CHCC)
16.0000 mg | Freq: Once | INTRAVENOUS | Status: AC
  Administered 2013-05-08: 16 mg via INTRAVENOUS

## 2013-05-08 MED ORDER — CYANOCOBALAMIN 1000 MCG/ML IJ SOLN
1000.0000 ug | Freq: Once | INTRAMUSCULAR | Status: AC
  Administered 2013-05-08: 1000 ug via INTRAMUSCULAR

## 2013-05-08 MED ORDER — ONDANSETRON 16 MG/50ML IVPB (CHCC)
INTRAVENOUS | Status: AC
  Filled 2013-05-08: qty 16

## 2013-05-08 NOTE — Progress Notes (Signed)
Patient given 2nd case of Ensure Plus.

## 2013-05-08 NOTE — Patient Instructions (Signed)
[facility] Discharge Instructions for Patients Receiving Chemotherapy  Today you received the following chemotherapy agents Alimta and Carboplatin.  To help prevent nausea and vomiting after your treatment, we encourage you to take your nausea medication.   If you develop nausea and vomiting that is not controlled by your nausea medication, call the clinic.   BELOW ARE SYMPTOMS THAT SHOULD BE REPORTED IMMEDIATELY:  *FEVER GREATER THAN 100.5 F  *CHILLS WITH OR WITHOUT FEVER  NAUSEA AND VOMITING THAT IS NOT CONTROLLED WITH YOUR NAUSEA MEDICATION  *UNUSUAL SHORTNESS OF BREATH  *UNUSUAL BRUISING OR BLEEDING  TENDERNESS IN MOUTH AND THROAT WITH OR WITHOUT PRESENCE OF ULCERS  *URINARY PROBLEMS  *BOWEL PROBLEMS  UNUSUAL RASH Items with * indicate a potential emergency and should be followed up as soon as possible.  Feel free to call the clinic you have any questions or concerns. The clinic phone number is [phone].

## 2013-05-13 ENCOUNTER — Other Ambulatory Visit (HOSPITAL_BASED_OUTPATIENT_CLINIC_OR_DEPARTMENT_OTHER): Payer: Medicare Other

## 2013-05-13 DIAGNOSIS — C3430 Malignant neoplasm of lower lobe, unspecified bronchus or lung: Secondary | ICD-10-CM

## 2013-05-13 LAB — COMPREHENSIVE METABOLIC PANEL (CC13)
ALK PHOS: 151 U/L — AB (ref 40–150)
ALT: 85 U/L — ABNORMAL HIGH (ref 0–55)
AST: 89 U/L — ABNORMAL HIGH (ref 5–34)
Albumin: 3.6 g/dL (ref 3.5–5.0)
Anion Gap: 11 mEq/L (ref 3–11)
BUN: 15.9 mg/dL (ref 7.0–26.0)
CO2: 25 mEq/L (ref 22–29)
CREATININE: 1.3 mg/dL (ref 0.7–1.3)
Calcium: 9.7 mg/dL (ref 8.4–10.4)
Chloride: 99 mEq/L (ref 98–109)
GLUCOSE: 107 mg/dL (ref 70–140)
Potassium: 4.5 mEq/L (ref 3.5–5.1)
SODIUM: 135 meq/L — AB (ref 136–145)
Total Bilirubin: 0.71 mg/dL (ref 0.20–1.20)
Total Protein: 7.7 g/dL (ref 6.4–8.3)

## 2013-05-13 LAB — CBC WITH DIFFERENTIAL/PLATELET
BASO%: 0.6 % (ref 0.0–2.0)
Basophils Absolute: 0 10*3/uL (ref 0.0–0.1)
EOS ABS: 0.1 10*3/uL (ref 0.0–0.5)
EOS%: 2 % (ref 0.0–7.0)
HCT: 32 % — ABNORMAL LOW (ref 38.4–49.9)
HEMOGLOBIN: 10.8 g/dL — AB (ref 13.0–17.1)
LYMPH#: 2 10*3/uL (ref 0.9–3.3)
LYMPH%: 54.3 % — ABNORMAL HIGH (ref 14.0–49.0)
MCH: 35.6 pg — ABNORMAL HIGH (ref 27.2–33.4)
MCHC: 33.7 g/dL (ref 32.0–36.0)
MCV: 105.4 fL — ABNORMAL HIGH (ref 79.3–98.0)
MONO#: 0.1 10*3/uL (ref 0.1–0.9)
MONO%: 2.9 % (ref 0.0–14.0)
NEUT%: 40.2 % (ref 39.0–75.0)
NEUTROS ABS: 1.5 10*3/uL (ref 1.5–6.5)
Platelets: 150 10*3/uL (ref 140–400)
RBC: 3.03 10*6/uL — ABNORMAL LOW (ref 4.20–5.82)
RDW: 15.3 % — AB (ref 11.0–14.6)
WBC: 3.7 10*3/uL — ABNORMAL LOW (ref 4.0–10.3)

## 2013-05-20 ENCOUNTER — Other Ambulatory Visit (HOSPITAL_BASED_OUTPATIENT_CLINIC_OR_DEPARTMENT_OTHER): Payer: Medicare Other

## 2013-05-20 DIAGNOSIS — C3430 Malignant neoplasm of lower lobe, unspecified bronchus or lung: Secondary | ICD-10-CM

## 2013-05-20 LAB — COMPREHENSIVE METABOLIC PANEL (CC13)
ALT: 46 U/L (ref 0–55)
AST: 58 U/L — ABNORMAL HIGH (ref 5–34)
Albumin: 3.6 g/dL (ref 3.5–5.0)
Alkaline Phosphatase: 142 U/L (ref 40–150)
Anion Gap: 9 mEq/L (ref 3–11)
BILIRUBIN TOTAL: 0.38 mg/dL (ref 0.20–1.20)
BUN: 7.3 mg/dL (ref 7.0–26.0)
CALCIUM: 9.6 mg/dL (ref 8.4–10.4)
CHLORIDE: 104 meq/L (ref 98–109)
CO2: 25 meq/L (ref 22–29)
Creatinine: 0.7 mg/dL (ref 0.7–1.3)
GLUCOSE: 80 mg/dL (ref 70–140)
Potassium: 4.1 mEq/L (ref 3.5–5.1)
Sodium: 138 mEq/L (ref 136–145)
TOTAL PROTEIN: 7.5 g/dL (ref 6.4–8.3)

## 2013-05-20 LAB — CBC WITH DIFFERENTIAL/PLATELET
BASO%: 0.5 % (ref 0.0–2.0)
Basophils Absolute: 0 10*3/uL (ref 0.0–0.1)
EOS ABS: 0.1 10*3/uL (ref 0.0–0.5)
EOS%: 1.3 % (ref 0.0–7.0)
HEMATOCRIT: 29.4 % — AB (ref 38.4–49.9)
HGB: 9.6 g/dL — ABNORMAL LOW (ref 13.0–17.1)
LYMPH#: 2.2 10*3/uL (ref 0.9–3.3)
LYMPH%: 58.8 % — AB (ref 14.0–49.0)
MCH: 34.3 pg — ABNORMAL HIGH (ref 27.2–33.4)
MCHC: 32.7 g/dL (ref 32.0–36.0)
MCV: 105 fL — ABNORMAL HIGH (ref 79.3–98.0)
MONO#: 0.8 10*3/uL (ref 0.1–0.9)
MONO%: 20.1 % — ABNORMAL HIGH (ref 0.0–14.0)
NEUT%: 19.3 % — AB (ref 39.0–75.0)
NEUTROS ABS: 0.7 10*3/uL — AB (ref 1.5–6.5)
PLATELETS: 90 10*3/uL — AB (ref 140–400)
RBC: 2.8 10*6/uL — ABNORMAL LOW (ref 4.20–5.82)
RDW: 15.4 % — ABNORMAL HIGH (ref 11.0–14.6)
WBC: 3.7 10*3/uL — AB (ref 4.0–10.3)

## 2013-05-27 ENCOUNTER — Ambulatory Visit (HOSPITAL_COMMUNITY)
Admission: RE | Admit: 2013-05-27 | Discharge: 2013-05-27 | Disposition: A | Discharge status: Home / Self Care | Payer: Medicare Other | Source: Ambulatory Visit | Attending: Internal Medicine | Admitting: Internal Medicine

## 2013-05-27 DIAGNOSIS — K7689 Other specified diseases of liver: Secondary | ICD-10-CM | POA: Insufficient documentation

## 2013-05-27 DIAGNOSIS — R599 Enlarged lymph nodes, unspecified: Secondary | ICD-10-CM | POA: Insufficient documentation

## 2013-05-27 DIAGNOSIS — C3490 Malignant neoplasm of unspecified part of unspecified bronchus or lung: Secondary | ICD-10-CM | POA: Insufficient documentation

## 2013-05-27 DIAGNOSIS — N400 Enlarged prostate without lower urinary tract symptoms: Secondary | ICD-10-CM | POA: Insufficient documentation

## 2013-05-27 DIAGNOSIS — I700 Atherosclerosis of aorta: Secondary | ICD-10-CM | POA: Insufficient documentation

## 2013-05-27 DIAGNOSIS — R911 Solitary pulmonary nodule: Secondary | ICD-10-CM | POA: Insufficient documentation

## 2013-05-27 DIAGNOSIS — N289 Disorder of kidney and ureter, unspecified: Secondary | ICD-10-CM | POA: Insufficient documentation

## 2013-05-27 MED ORDER — IOHEXOL 300 MG/ML  SOLN
100.0000 mL | Freq: Once | INTRAMUSCULAR | Status: AC | PRN
  Administered 2013-05-27: 100 mL via INTRAVENOUS

## 2013-05-29 ENCOUNTER — Ambulatory Visit (HOSPITAL_BASED_OUTPATIENT_CLINIC_OR_DEPARTMENT_OTHER): Payer: Medicare Other | Admitting: Physician Assistant

## 2013-05-29 ENCOUNTER — Other Ambulatory Visit (HOSPITAL_BASED_OUTPATIENT_CLINIC_OR_DEPARTMENT_OTHER): Payer: Medicare Other

## 2013-05-29 ENCOUNTER — Encounter: Payer: Self-pay | Admitting: Physician Assistant

## 2013-05-29 ENCOUNTER — Telehealth: Payer: Self-pay | Admitting: Internal Medicine

## 2013-05-29 VITALS — BP 144/83 | HR 95 | Temp 98.1°F | Resp 18 | Ht 67.0 in | Wt 147.8 lb

## 2013-05-29 DIAGNOSIS — C3490 Malignant neoplasm of unspecified part of unspecified bronchus or lung: Secondary | ICD-10-CM

## 2013-05-29 DIAGNOSIS — C3430 Malignant neoplasm of lower lobe, unspecified bronchus or lung: Secondary | ICD-10-CM

## 2013-05-29 LAB — COMPREHENSIVE METABOLIC PANEL (CC13)
ALBUMIN: 3.8 g/dL (ref 3.5–5.0)
ALK PHOS: 163 U/L — AB (ref 40–150)
ALT: 38 U/L (ref 0–55)
AST: 54 U/L — AB (ref 5–34)
Anion Gap: 11 mEq/L (ref 3–11)
BILIRUBIN TOTAL: 0.42 mg/dL (ref 0.20–1.20)
BUN: 9 mg/dL (ref 7.0–26.0)
CO2: 25 mEq/L (ref 22–29)
Calcium: 9.9 mg/dL (ref 8.4–10.4)
Chloride: 103 mEq/L (ref 98–109)
Creatinine: 0.8 mg/dL (ref 0.7–1.3)
Glucose: 101 mg/dl (ref 70–140)
POTASSIUM: 4.2 meq/L (ref 3.5–5.1)
SODIUM: 140 meq/L (ref 136–145)
Total Protein: 7.8 g/dL (ref 6.4–8.3)

## 2013-05-29 LAB — CBC WITH DIFFERENTIAL/PLATELET
BASO%: 0.5 % (ref 0.0–2.0)
Basophils Absolute: 0 10*3/uL (ref 0.0–0.1)
EOS%: 1.9 % (ref 0.0–7.0)
Eosinophils Absolute: 0.1 10*3/uL (ref 0.0–0.5)
HEMATOCRIT: 31.4 % — AB (ref 38.4–49.9)
HEMOGLOBIN: 10.5 g/dL — AB (ref 13.0–17.1)
LYMPH#: 1.4 10*3/uL (ref 0.9–3.3)
LYMPH%: 38.8 % (ref 14.0–49.0)
MCH: 36.1 pg — ABNORMAL HIGH (ref 27.2–33.4)
MCHC: 33.5 g/dL (ref 32.0–36.0)
MCV: 107.8 fL — ABNORMAL HIGH (ref 79.3–98.0)
MONO#: 0.6 10*3/uL (ref 0.1–0.9)
MONO%: 17 % — ABNORMAL HIGH (ref 0.0–14.0)
NEUT#: 1.6 10*3/uL (ref 1.5–6.5)
NEUT%: 41.8 % (ref 39.0–75.0)
Platelets: 257 10*3/uL (ref 140–400)
RBC: 2.91 10*6/uL — ABNORMAL LOW (ref 4.20–5.82)
RDW: 16 % — ABNORMAL HIGH (ref 11.0–14.6)
WBC: 3.7 10*3/uL — AB (ref 4.0–10.3)

## 2013-05-29 NOTE — Telephone Encounter (Signed)
pt sched to see Dr. Martin Majestic worth on Feb 5th @ 8am
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
## 2013-05-30 NOTE — Patient Instructions (Signed)
You are being referred to Dr. Pablo Ledger for evaluation for a possible course of concurrent chemoradiation

## 2013-06-03 ENCOUNTER — Telehealth: Payer: Self-pay | Admitting: Internal Medicine

## 2013-06-03 NOTE — Telephone Encounter (Signed)
s.w. pt and advised on Feb appts...pt advised that he will pick up completed sched on 2.5 when he comes to see Dr. Pablo Ledger
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
## 2013-06-06 ENCOUNTER — Ambulatory Visit
Admission: RE | Admit: 2013-06-06 | Discharge: 2013-06-06 | Disposition: A | Discharge status: Home / Self Care | Payer: Medicare Other | Source: Ambulatory Visit | Attending: Radiation Oncology | Admitting: Radiation Oncology

## 2013-06-06 ENCOUNTER — Encounter: Payer: Self-pay | Admitting: Radiation Oncology

## 2013-06-06 VITALS — BP 135/81 | HR 83 | Temp 98.0°F | Resp 20 | Ht 66.0 in | Wt 149.1 lb

## 2013-06-06 DIAGNOSIS — M899 Disorder of bone, unspecified: Secondary | ICD-10-CM | POA: Insufficient documentation

## 2013-06-06 DIAGNOSIS — C3490 Malignant neoplasm of unspecified part of unspecified bronchus or lung: Secondary | ICD-10-CM

## 2013-06-06 DIAGNOSIS — J45909 Unspecified asthma, uncomplicated: Secondary | ICD-10-CM | POA: Insufficient documentation

## 2013-06-06 DIAGNOSIS — Z7982 Long term (current) use of aspirin: Secondary | ICD-10-CM | POA: Insufficient documentation

## 2013-06-06 DIAGNOSIS — Z79899 Other long term (current) drug therapy: Secondary | ICD-10-CM | POA: Insufficient documentation

## 2013-06-06 DIAGNOSIS — R911 Solitary pulmonary nodule: Secondary | ICD-10-CM | POA: Insufficient documentation

## 2013-06-06 DIAGNOSIS — R599 Enlarged lymph nodes, unspecified: Secondary | ICD-10-CM | POA: Insufficient documentation

## 2013-06-06 DIAGNOSIS — J029 Acute pharyngitis, unspecified: Secondary | ICD-10-CM | POA: Insufficient documentation

## 2013-06-06 DIAGNOSIS — Z9221 Personal history of antineoplastic chemotherapy: Secondary | ICD-10-CM | POA: Insufficient documentation

## 2013-06-06 DIAGNOSIS — N400 Enlarged prostate without lower urinary tract symptoms: Secondary | ICD-10-CM | POA: Insufficient documentation

## 2013-06-06 DIAGNOSIS — L538 Other specified erythematous conditions: Secondary | ICD-10-CM | POA: Insufficient documentation

## 2013-06-06 DIAGNOSIS — K7689 Other specified diseases of liver: Secondary | ICD-10-CM | POA: Insufficient documentation

## 2013-06-06 DIAGNOSIS — M949 Disorder of cartilage, unspecified: Secondary | ICD-10-CM

## 2013-06-06 DIAGNOSIS — Z87891 Personal history of nicotine dependence: Secondary | ICD-10-CM | POA: Insufficient documentation

## 2013-06-06 DIAGNOSIS — K219 Gastro-esophageal reflux disease without esophagitis: Secondary | ICD-10-CM | POA: Insufficient documentation

## 2013-06-06 DIAGNOSIS — Z510 Encounter for antineoplastic radiation therapy: Secondary | ICD-10-CM | POA: Insufficient documentation

## 2013-06-06 DIAGNOSIS — I700 Atherosclerosis of aorta: Secondary | ICD-10-CM | POA: Insufficient documentation

## 2013-06-06 DIAGNOSIS — N289 Disorder of kidney and ureter, unspecified: Secondary | ICD-10-CM | POA: Insufficient documentation

## 2013-06-06 DIAGNOSIS — R1310 Dysphagia, unspecified: Secondary | ICD-10-CM | POA: Insufficient documentation

## 2013-06-06 NOTE — Progress Notes (Addendum)
[facility] Simulation and Treatment Planning Note   Name: ROBERTS BON MRN: [id number]  Date: 06/06/2013  DOB: 05/28/1950  Status: outpatient    DIAGNOSIS: Lung cancer    SIDE: right   CONSENT VERIFIED: yes   SET UP AND IMMOBILIZATION: Patient is setup supine with arms in a wing board.   NARRATIVE: The patient was brought to the [facility].  Identity was confirmed.  All relevant records and images related to the planned course of therapy were reviewed.  Then, the patient was positioned in a stable reproducible clinical set-up for radiation therapy.  CT images were obtained.  Skin markings were placed.  4D CT was then performed to track motion of the lower lobe tumor.  By accounting for motion, I am hopeful we can decrease margin and have a better chance at obtaining adequate V5 and V20 numbers given the size and space between his tumors. The CT images were loaded into the planning software where the target and avoidance structures were contoured.  The radiation prescription was entered and confirmed.   TREATMENT PLANNING NOTE:  Treatment planning then occurred. I have requested IMRT planning with a DVH of the spinal cord, total lungs and gross tumor volume. IMRT will be utilized to spare critical structures such as normal lung volumes which were not able to be spared adequately with a 3D plan. An IMRT device is requested.   Special treatment procedure will be performed as Frazier Richards will be receiving concurrent chemotherapy.   I have ordered a consult with the dietician for monitoring.  I will also be verifying that weekly lab values are appropriate.

## 2013-06-06 NOTE — Progress Notes (Addendum)
Radiation Oncology         [phone]) [phone] ________________________________  Initial outpatient Consultation - Date: 06/06/2013   Name: Raymond Gregory MRN: [id number]   DOB: Jun 06, 1950  REFERRING PHYSICIAN: Carlton Adam, PA-C  DIAGNOSIS:  1. Lung cancer     STAGE: Lung cancer   Primary site: Lung (Right)   Staging method: AJCC 7th Edition   Clinical: Stage IIIA (T3, N2, M0) signed by Curt Bears, MD on 01/17/2013  2:08 PM   Summary: Stage IIIA (T3, N2, M0)   HISTORY OF PRESENT ILLNESS::Raymond Gregory is a 63 y.o. male  who presented to the emergency room in August or September with a fever and cough. A chest x-ray showed 2 masses in the right lung. A CT of the chest showed multiple pulmonary nodules one in the right lower lobe measuring 6.5 x 5.2 cm and the other in the upper lobe measuring 2.6 x 3.5 cm. Right hilar and mediastinal adenopathy were also present. He underwent bronchoscopy and biopsy on 12/17/2012 which showed atypical cells. A PET scan on 12/25/2012 showed hypermetabolic activity in the right lower lobe, a right upper lobe, right hilum and subcarinal adenopathy. A right anterior mediastinal lymph node was also slightly hypermetabolic. No evidence of metastatic disease was noted. He underwent a CT-guided biopsy of the right lower lobe mass which showed adenocarcinoma. The tumor was positive for TTF-1. He proceeded with neoadjuvant chemotherapy. He has had 6 cycles of carboplatin and Alimta. He tolerated chemotherapy well except for some nausea. He has quit smoking. He recently had a CT scan on 05/27/2013 which showed a 13 mm subcarinal lymph node which is unchanged an unchanged right hilar node. The right upper lobe mass had decreased in size 18 x 18 mm. The right lower lobe lesion had also decreased in size to 4.9 x 5.8 cm. No evidence of metastatic disease was noted. He was referred back for consideration of definitive chemoradiation in the management of his disease. He has  no complaints today. I should also note he had an MRI of the brain in October which was negative for evidence of metastatic disease.  PREVIOUS RADIATION THERAPY: No  PAST MEDICAL HISTORY:  has a past medical history of GERD (gastroesophageal reflux disease); Rectal bleeding; Substance abuse; Prosthetic eye globe; Colonic polyp; Numbness and tingling in hands; Allergy; Arthritis; Asthma; and Cataract.    PAST SURGICAL HISTORY: Past Surgical History  Procedure Laterality Date  . Colonoscopy w/ polypectomy      07/2003 and 06/2008  . Colonoscopy    . Polypectomy    . Eye surgery      left  . Video bronchoscopy Bilateral 12/17/2012    Procedure: VIDEO BRONCHOSCOPY WITH FLUORO;  Surgeon: Collene Gobble, MD;  Location: [facility];  Service: Cardiopulmonary;  Laterality: Bilateral;    FAMILY HISTORY:  Family History  Problem Relation Age of Onset  . Cancer Mother 60    brain cancer  . Cancer Father     lung cancer  . Cancer Sister 65    brain cancer  . Hypertension Brother   . Colon cancer Neg Hx   . Esophageal cancer Neg Hx   . Rectal cancer Neg Hx   . Stomach cancer Neg Hx     SOCIAL HISTORY:  History  Substance Use Topics  . Smoking status: Former Smoker -- 0.25 packs/day for 20 years    Types: Cigarettes    Quit date: 12/07/2012  . Smokeless tobacco: Never Used  Comment: last smoked .>1 month ago  . Alcohol Use: No     Comment: former    ALLERGIES: Review of patient's allergies indicates no known allergies.  MEDICATIONS:  Current Outpatient Prescriptions  Medication Sig Dispense Refill  . aspirin 81 MG tablet Take 81 mg by mouth daily as needed (for analgesic).       . folic acid (FOLVITE) 1 MG tablet Take 1 tablet (1 mg total) by mouth daily.  30 tablet  4  . dexamethasone (DECADRON) 4 MG tablet 4 mg by mouth twice a day the day before, day of and day after the chemotherapy every 3 weeks  40 tablet  1  . ondansetron (ZOFRAN) 8 MG tablet Take 1 tablet (8 mg  total) by mouth every 8 (eight) hours as needed for nausea.  20 tablet  3  . prochlorperazine (COMPAZINE) 10 MG tablet Take 1 tablet (10 mg total) by mouth every 6 (six) hours as needed.  60 tablet  0   No current facility-administered medications for this encounter.    REVIEW OF SYSTEMS:  A 15 point review of systems is documented in the electronic medical record. This was obtained by the nursing staff. However, I reviewed this with the patient to discuss relevant findings and make appropriate changes.  Pertinent items are noted in HPI.  PHYSICAL EXAM:  Filed Vitals:   06/06/13 0759  BP: 135/81  Pulse: 83  Temp: 98 F (36.7 C)  Resp: 20  .149 lb 1.6 oz (67.631 kg). He is alert and oriented x3. He has no respiratory symptoms. His packed out of 5 strength in the bilateral upper and lower extremities. Lung sounds are clear. Cranial nurse 3 through 12 are tested and intact.  LABORATORY DATA:  Lab Results  Component Value Date   WBC 3.7* 05/29/2013   HGB 10.5* 05/29/2013   HCT 31.4* 05/29/2013   MCV 107.8* 05/29/2013   PLT 257 05/29/2013   Lab Results  Component Value Date   NA 140 05/29/2013   K 4.2 05/29/2013   CL 98 12/14/2012   CO2 25 05/29/2013   Lab Results  Component Value Date   ALT 38 05/29/2013   AST 54* 05/29/2013   ALKPHOS 163* 05/29/2013   BILITOT 0.42 05/29/2013     RADIOGRAPHY: Ct Chest W Contrast  05/27/2013   CLINICAL DATA:  Lung cancer.  Stage IIIA adenocarcinoma.  EXAM: CT CHEST, ABDOMEN, AND PELVIS WITH CONTRAST  TECHNIQUE: Multidetector CT imaging of the chest, abdomen and pelvis was performed following the standard protocol during bolus administration of intravenous contrast.  CONTRAST:  123mL OMNIPAQUE IOHEXOL 300 MG/ML  SOLN  COMPARISON:  03/25/2013  FINDINGS: CT CHEST FINDINGS  There is no axillary lymphadenopathy. 13 mm subcarinal lymph node is unchanged. 9 mm short axis right hilar lymph node is unchanged. The 11 mm short axis right infrahilar lymph node is  stable.  Heart size is normal. There is no pericardial or pleural effusion. Fluid is again noted in the distal esophagus.  The 2.2 x 2.3 cm right upper lobe spiculated nodule, contiguous with the minor fissure, has decreased in size to 18 x 18 mm. The large right lower lobe with lesion which was 4.9 x 5.8 cm previously now measures 6.0 x 5.0 cm, not substantially changed. However, a new 6 mm satellite nodule is visible on image 44.  12 mm focus of ill-defined density along the major fissure of the left lung (image 22) is stable.  Bone windows reveal no  worrisome lytic or sclerotic osseous lesions.  CT ABDOMEN AND PELVIS FINDINGS  Heterogeneous attenuation along the lateral hepatic dome is stable. Scattered tiny hypo attenuating lesions in the liver parenchyma are unchanged. Spleen has normal imaging features. The stomach, duodenum, pancreas, gallbladder, and adrenal glands are normal. 9 mm well-defined low-density lesion in the upper pole the right kidney is compatible with a cyst. 16 mm left renal lesion is also likely a cyst.  There is atherosclerotic calcification in the wall of the abdominal aorta without aneurysm. Lymph nodes in the hepatoduodenal and gastrohepatic ligaments do not meet CT criteria for pathologic enlargement. No free fluid in the abdomen.  Imaging through the pelvis shows no free intraperitoneal fluid. No pelvic sidewall lymphadenopathy. Bladder is unremarkable. Prostate gland is mildly enlarged. No substantial diverticular disease in the colon. There is no colonic diverticulitis. Terminal ileum is normal. The appendix is normal.  5 mm lucent lesion in the left iliac crest appears to involve the cortex. 10 mm lucent lesion is identified in the right iliac bone. While metastatic disease cannot be excluded, neither lesion is unchanged since the PET-CT of 12/25/2012. Otherwise no worrisome lytic or sclerotic osseous abnormality within the visualized bony structures.  IMPRESSION: 1. Slight interval  decrease in size of the spiculated right upper lobe nodule, now measuring 18 mm in maximum dimension. 2. The large posterior right lower lobe mass measures minimally larger today but is not substantially changed. However, the appearance of a new 6 mm satellite nodule is concerning for progression. 3. Stable mediastinal and right hilar lymphadenopathy. 4. Stable appearance of heterogeneous attenuation in the subcapsular liver along the hepatic dome. Continued attention in this region on followup imaging is recommended. 5. Small lucencies in the iliac crest bilaterally. These are stable since 12/25/2012. Continued attention on followup imaging recommended.   Electronically Signed   By: Misty Stanley M.D.   On: 05/27/2013 17:15   Ct Abdomen Pelvis W Contrast  05/27/2013   CLINICAL DATA:  Lung cancer.  Stage IIIA adenocarcinoma.  EXAM: CT CHEST, ABDOMEN, AND PELVIS WITH CONTRAST  TECHNIQUE: Multidetector CT imaging of the chest, abdomen and pelvis was performed following the standard protocol during bolus administration of intravenous contrast.  CONTRAST:  136mL OMNIPAQUE IOHEXOL 300 MG/ML  SOLN  COMPARISON:  03/25/2013  FINDINGS: CT CHEST FINDINGS  There is no axillary lymphadenopathy. 13 mm subcarinal lymph node is unchanged. 9 mm short axis right hilar lymph node is unchanged. The 11 mm short axis right infrahilar lymph node is stable.  Heart size is normal. There is no pericardial or pleural effusion. Fluid is again noted in the distal esophagus.  The 2.2 x 2.3 cm right upper lobe spiculated nodule, contiguous with the minor fissure, has decreased in size to 18 x 18 mm. The large right lower lobe with lesion which was 4.9 x 5.8 cm previously now measures 6.0 x 5.0 cm, not substantially changed. However, a new 6 mm satellite nodule is visible on image 44.  12 mm focus of ill-defined density along the major fissure of the left lung (image 22) is stable.  Bone windows reveal no worrisome lytic or sclerotic osseous  lesions.  CT ABDOMEN AND PELVIS FINDINGS  Heterogeneous attenuation along the lateral hepatic dome is stable. Scattered tiny hypo attenuating lesions in the liver parenchyma are unchanged. Spleen has normal imaging features. The stomach, duodenum, pancreas, gallbladder, and adrenal glands are normal. 9 mm well-defined low-density lesion in the upper pole the right kidney is compatible with  a cyst. 16 mm left renal lesion is also likely a cyst.  There is atherosclerotic calcification in the wall of the abdominal aorta without aneurysm. Lymph nodes in the hepatoduodenal and gastrohepatic ligaments do not meet CT criteria for pathologic enlargement. No free fluid in the abdomen.  Imaging through the pelvis shows no free intraperitoneal fluid. No pelvic sidewall lymphadenopathy. Bladder is unremarkable. Prostate gland is mildly enlarged. No substantial diverticular disease in the colon. There is no colonic diverticulitis. Terminal ileum is normal. The appendix is normal.  5 mm lucent lesion in the left iliac crest appears to involve the cortex. 10 mm lucent lesion is identified in the right iliac bone. While metastatic disease cannot be excluded, neither lesion is unchanged since the PET-CT of 12/25/2012. Otherwise no worrisome lytic or sclerotic osseous abnormality within the visualized bony structures.  IMPRESSION: 1. Slight interval decrease in size of the spiculated right upper lobe nodule, now measuring 18 mm in maximum dimension. 2. The large posterior right lower lobe mass measures minimally larger today but is not substantially changed. However, the appearance of a new 6 mm satellite nodule is concerning for progression. 3. Stable mediastinal and right hilar lymphadenopathy. 4. Stable appearance of heterogeneous attenuation in the subcapsular liver along the hepatic dome. Continued attention in this region on followup imaging is recommended. 5. Small lucencies in the iliac crest bilaterally. These are stable  since 12/25/2012. Continued attention on followup imaging recommended.   Electronically Signed   By: Misty Stanley M.D.   On: 05/27/2013 17:15      IMPRESSION: T3 N2 adenocarcinoma the right upper lobe status post neoadjuvant chemotherapy  PLAN: I think it's reasonable to simulate him today. It's really not the size of either lesion but the distance between them that we'll be the rate limiting step in treatment. We will simulate him today using 4D CT and the hopes that we can provide adequate tumoricidal dose with sparing of normal lung structures. We discussed the process of simulation the placement tattoos. We discussed 6 weeks of treatment as an outpatient. We discussed the possibility of esophagitis and lung damage. We discussed the use of 3-D technique to spare as much normal tissue as possible. He has started in meeting with our dietitian and is drinking Ensure. He has signed informed consent and agree to proceed forward. I spent 40 minutes  face to face with the patient and more than 50% of that time was spent in counseling and/or coordination of care.   ------------------------------------------------  Thea Silversmith, MD

## 2013-06-06 NOTE — Progress Notes (Signed)
Please see the Nurse Progress Note in the MD Initial Consult Encounter for this patient. 

## 2013-06-10 ENCOUNTER — Other Ambulatory Visit (HOSPITAL_BASED_OUTPATIENT_CLINIC_OR_DEPARTMENT_OTHER): Payer: Medicare Other

## 2013-06-10 ENCOUNTER — Ambulatory Visit (HOSPITAL_BASED_OUTPATIENT_CLINIC_OR_DEPARTMENT_OTHER): Payer: Medicare Other

## 2013-06-10 VITALS — BP 142/84 | HR 67 | Temp 98.6°F | Resp 18

## 2013-06-10 DIAGNOSIS — C3430 Malignant neoplasm of lower lobe, unspecified bronchus or lung: Secondary | ICD-10-CM

## 2013-06-10 DIAGNOSIS — Z5111 Encounter for antineoplastic chemotherapy: Secondary | ICD-10-CM

## 2013-06-10 DIAGNOSIS — C3490 Malignant neoplasm of unspecified part of unspecified bronchus or lung: Secondary | ICD-10-CM

## 2013-06-10 LAB — CBC WITH DIFFERENTIAL/PLATELET
BASO%: 0.9 % (ref 0.0–2.0)
Basophils Absolute: 0 10*3/uL (ref 0.0–0.1)
EOS ABS: 0.1 10*3/uL (ref 0.0–0.5)
EOS%: 1.3 % (ref 0.0–7.0)
HCT: 32.5 % — ABNORMAL LOW (ref 38.4–49.9)
HGB: 10.6 g/dL — ABNORMAL LOW (ref 13.0–17.1)
LYMPH%: 34.4 % (ref 14.0–49.0)
MCH: 34.3 pg — ABNORMAL HIGH (ref 27.2–33.4)
MCHC: 32.6 g/dL (ref 32.0–36.0)
MCV: 105.2 fL — AB (ref 79.3–98.0)
MONO#: 0.6 10*3/uL (ref 0.1–0.9)
MONO%: 13.7 % (ref 0.0–14.0)
NEUT#: 2.2 10*3/uL (ref 1.5–6.5)
NEUT%: 49.7 % (ref 39.0–75.0)
NRBC: 0 % (ref 0–0)
PLATELETS: 218 10*3/uL (ref 140–400)
RBC: 3.09 10*6/uL — AB (ref 4.20–5.82)
RDW: 13.9 % (ref 11.0–14.6)
WBC: 4.5 10*3/uL (ref 4.0–10.3)
lymph#: 1.5 10*3/uL (ref 0.9–3.3)

## 2013-06-10 LAB — COMPREHENSIVE METABOLIC PANEL (CC13)
ALT: 39 U/L (ref 0–55)
ANION GAP: 10 meq/L (ref 3–11)
AST: 54 U/L — ABNORMAL HIGH (ref 5–34)
Albumin: 3.9 g/dL (ref 3.5–5.0)
Alkaline Phosphatase: 153 U/L — ABNORMAL HIGH (ref 40–150)
BUN: 9.8 mg/dL (ref 7.0–26.0)
CALCIUM: 10.1 mg/dL (ref 8.4–10.4)
CO2: 26 meq/L (ref 22–29)
CREATININE: 0.8 mg/dL (ref 0.7–1.3)
Chloride: 105 mEq/L (ref 98–109)
Glucose: 110 mg/dl (ref 70–140)
Potassium: 4.7 mEq/L (ref 3.5–5.1)
Sodium: 140 mEq/L (ref 136–145)
Total Bilirubin: 0.44 mg/dL (ref 0.20–1.20)
Total Protein: 7.7 g/dL (ref 6.4–8.3)

## 2013-06-10 MED ORDER — DIPHENHYDRAMINE HCL 50 MG/ML IJ SOLN
50.0000 mg | Freq: Once | INTRAMUSCULAR | Status: AC
  Administered 2013-06-10: 50 mg via INTRAVENOUS

## 2013-06-10 MED ORDER — SODIUM CHLORIDE 0.9 % IV SOLN
45.0000 mg/m2 | Freq: Once | INTRAVENOUS | Status: AC
  Administered 2013-06-10: 78 mg via INTRAVENOUS
  Filled 2013-06-10: qty 13

## 2013-06-10 MED ORDER — SODIUM CHLORIDE 0.9 % IV SOLN
Freq: Once | INTRAVENOUS | Status: AC
  Administered 2013-06-10: 14:00:00 via INTRAVENOUS

## 2013-06-10 MED ORDER — DIPHENHYDRAMINE HCL 50 MG/ML IJ SOLN
INTRAMUSCULAR | Status: AC
  Filled 2013-06-10: qty 1

## 2013-06-10 MED ORDER — ONDANSETRON 16 MG/50ML IVPB (CHCC)
16.0000 mg | Freq: Once | INTRAVENOUS | Status: AC
  Administered 2013-06-10: 16 mg via INTRAVENOUS

## 2013-06-10 MED ORDER — CARBOPLATIN CHEMO INTRADERMAL TEST DOSE 100MCG/0.02ML
100.0000 ug | Freq: Once | INTRADERMAL | Status: AC
  Administered 2013-06-10: 100 ug via INTRADERMAL
  Filled 2013-06-10: qty 0.01

## 2013-06-10 MED ORDER — FAMOTIDINE IN NACL 20-0.9 MG/50ML-% IV SOLN
20.0000 mg | Freq: Once | INTRAVENOUS | Status: AC
  Administered 2013-06-10: 20 mg via INTRAVENOUS

## 2013-06-10 MED ORDER — DEXAMETHASONE SODIUM PHOSPHATE 20 MG/5ML IJ SOLN
INTRAMUSCULAR | Status: AC
  Filled 2013-06-10: qty 5

## 2013-06-10 MED ORDER — ONDANSETRON 16 MG/50ML IVPB (CHCC)
INTRAVENOUS | Status: AC
  Filled 2013-06-10: qty 16

## 2013-06-10 MED ORDER — CARBOPLATIN CHEMO INJECTION 450 MG/45ML
233.8000 mg | Freq: Once | INTRAVENOUS | Status: AC
  Administered 2013-06-10: 230 mg via INTRAVENOUS
  Filled 2013-06-10: qty 23

## 2013-06-10 MED ORDER — FAMOTIDINE IN NACL 20-0.9 MG/50ML-% IV SOLN
INTRAVENOUS | Status: AC
  Filled 2013-06-10: qty 50

## 2013-06-10 MED ORDER — DEXAMETHASONE SODIUM PHOSPHATE 20 MG/5ML IJ SOLN
20.0000 mg | Freq: Once | INTRAMUSCULAR | Status: AC
  Administered 2013-06-10: 20 mg via INTRAVENOUS

## 2013-06-10 NOTE — Progress Notes (Signed)
Taxol continues at max rate of 263 ml/hr and tolerated well.

## 2013-06-10 NOTE — Patient Instructions (Signed)
[facility] Discharge Instructions for Patients Receiving Chemotherapy  Today you received the following chemotherapy agents Taxol,Carboplatin  To help prevent nausea and vomiting after your treatment, we encourage you to take your nausea medication as prescribed.   If you develop nausea and vomiting that is not controlled by your nausea medication, call the clinic.   BELOW ARE SYMPTOMS THAT SHOULD BE REPORTED IMMEDIATELY:  *FEVER GREATER THAN 100.5 F  *CHILLS WITH OR WITHOUT FEVER  NAUSEA AND VOMITING THAT IS NOT CONTROLLED WITH YOUR NAUSEA MEDICATION  *UNUSUAL SHORTNESS OF BREATH  *UNUSUAL BRUISING OR BLEEDING  TENDERNESS IN MOUTH AND THROAT WITH OR WITHOUT PRESENCE OF ULCERS  *URINARY PROBLEMS  *BOWEL PROBLEMS  UNUSUAL RASH Items with * indicate a potential emergency and should be followed up as soon as possible.  Feel free to call the clinic you have any questions or concerns. The clinic phone number is [phone].

## 2013-06-10 NOTE — Progress Notes (Signed)
Discharged, ambulatory in no distress with spouse.

## 2013-06-11 ENCOUNTER — Telehealth: Payer: Self-pay | Admitting: *Deleted

## 2013-06-11 ENCOUNTER — Other Ambulatory Visit: Payer: Self-pay | Admitting: *Deleted

## 2013-06-11 NOTE — Telephone Encounter (Signed)
Called pt at home and instructed pt not to take Decadron and Folic acid with this chemo regimen as clarified with Adrena, PA.  Confirmed with pt that he has Zofran and Compazine for antiemetics.  Pt able to verbalize back that he should stop taking Decadron and Folic acid.

## 2013-06-11 NOTE — Telephone Encounter (Signed)
Called pt at home for post chemo follow up call.  Pt stated he is doing fine.  Denied nausea/vomiting; stated bowel and bladder function fine; appetite good and drinking lots of fluids as tolerated.  Went over instructions how to take Decadron before Taxol.   Pt understood to call office with any new issues.  Pt aware of next office appts.

## 2013-06-13 ENCOUNTER — Ambulatory Visit: Payer: Medicare Other | Admitting: Radiation Oncology

## 2013-06-17 ENCOUNTER — Ambulatory Visit (HOSPITAL_BASED_OUTPATIENT_CLINIC_OR_DEPARTMENT_OTHER): Payer: Medicare Other | Admitting: Internal Medicine

## 2013-06-17 ENCOUNTER — Ambulatory Visit
Admission: RE | Admit: 2013-06-17 | Discharge: 2013-06-17 | Disposition: A | Discharge status: Home / Self Care | Payer: Medicare Other | Source: Ambulatory Visit | Attending: Radiation Oncology | Admitting: Radiation Oncology

## 2013-06-17 ENCOUNTER — Other Ambulatory Visit (HOSPITAL_BASED_OUTPATIENT_CLINIC_OR_DEPARTMENT_OTHER): Payer: Medicare Other

## 2013-06-17 ENCOUNTER — Encounter: Payer: Self-pay | Admitting: Internal Medicine

## 2013-06-17 ENCOUNTER — Ambulatory Visit (HOSPITAL_BASED_OUTPATIENT_CLINIC_OR_DEPARTMENT_OTHER): Payer: Medicare Other

## 2013-06-17 ENCOUNTER — Telehealth: Payer: Self-pay | Admitting: *Deleted

## 2013-06-17 ENCOUNTER — Encounter: Payer: Self-pay | Admitting: Nutrition

## 2013-06-17 ENCOUNTER — Telehealth: Payer: Self-pay | Admitting: Internal Medicine

## 2013-06-17 VITALS — BP 142/87 | HR 63 | Temp 97.3°F | Resp 18 | Ht 66.0 in | Wt 149.7 lb

## 2013-06-17 DIAGNOSIS — C3430 Malignant neoplasm of lower lobe, unspecified bronchus or lung: Secondary | ICD-10-CM

## 2013-06-17 DIAGNOSIS — Z5111 Encounter for antineoplastic chemotherapy: Secondary | ICD-10-CM

## 2013-06-17 DIAGNOSIS — C3490 Malignant neoplasm of unspecified part of unspecified bronchus or lung: Secondary | ICD-10-CM

## 2013-06-17 LAB — COMPREHENSIVE METABOLIC PANEL (CC13)
ALBUMIN: 3.9 g/dL (ref 3.5–5.0)
ALK PHOS: 143 U/L (ref 40–150)
ALT: 38 U/L (ref 0–55)
AST: 50 U/L — AB (ref 5–34)
Anion Gap: 9 mEq/L (ref 3–11)
BUN: 11.6 mg/dL (ref 7.0–26.0)
CO2: 24 mEq/L (ref 22–29)
Calcium: 10 mg/dL (ref 8.4–10.4)
Chloride: 103 mEq/L (ref 98–109)
Creatinine: 0.7 mg/dL (ref 0.7–1.3)
GLUCOSE: 94 mg/dL (ref 70–140)
POTASSIUM: 4.2 meq/L (ref 3.5–5.1)
Sodium: 136 mEq/L (ref 136–145)
Total Bilirubin: 0.41 mg/dL (ref 0.20–1.20)
Total Protein: 7.8 g/dL (ref 6.4–8.3)

## 2013-06-17 LAB — CBC WITH DIFFERENTIAL/PLATELET
BASO%: 1.4 % (ref 0.0–2.0)
BASOS ABS: 0.1 10*3/uL (ref 0.0–0.1)
EOS%: 2.7 % (ref 0.0–7.0)
Eosinophils Absolute: 0.2 10*3/uL (ref 0.0–0.5)
HEMATOCRIT: 35.1 % — AB (ref 38.4–49.9)
HEMOGLOBIN: 11.7 g/dL — AB (ref 13.0–17.1)
LYMPH#: 2.3 10*3/uL (ref 0.9–3.3)
LYMPH%: 36.8 % (ref 14.0–49.0)
MCH: 34.7 pg — ABNORMAL HIGH (ref 27.2–33.4)
MCHC: 33.3 g/dL (ref 32.0–36.0)
MCV: 104.2 fL — ABNORMAL HIGH (ref 79.3–98.0)
MONO#: 0.8 10*3/uL (ref 0.1–0.9)
MONO%: 12.8 % (ref 0.0–14.0)
NEUT%: 46.3 % (ref 39.0–75.0)
NEUTROS ABS: 2.9 10*3/uL (ref 1.5–6.5)
Platelets: 191 10*3/uL (ref 140–400)
RBC: 3.37 10*6/uL — ABNORMAL LOW (ref 4.20–5.82)
RDW: 13.6 % (ref 11.0–14.6)
WBC: 6.3 10*3/uL (ref 4.0–10.3)
nRBC: 0 % (ref 0–0)

## 2013-06-17 MED ORDER — ONDANSETRON 16 MG/50ML IVPB (CHCC)
INTRAVENOUS | Status: AC
  Filled 2013-06-17: qty 16

## 2013-06-17 MED ORDER — DIPHENHYDRAMINE HCL 50 MG/ML IJ SOLN
50.0000 mg | Freq: Once | INTRAMUSCULAR | Status: AC
  Administered 2013-06-17: 50 mg via INTRAVENOUS

## 2013-06-17 MED ORDER — SODIUM CHLORIDE 0.9 % IV SOLN
45.0000 mg/m2 | Freq: Once | INTRAVENOUS | Status: AC
  Administered 2013-06-17: 78 mg via INTRAVENOUS
  Filled 2013-06-17: qty 13

## 2013-06-17 MED ORDER — FAMOTIDINE IN NACL 20-0.9 MG/50ML-% IV SOLN
20.0000 mg | Freq: Once | INTRAVENOUS | Status: AC
  Administered 2013-06-17: 20 mg via INTRAVENOUS

## 2013-06-17 MED ORDER — CARBOPLATIN CHEMO INTRADERMAL TEST DOSE 100MCG/0.02ML
100.0000 ug | Freq: Once | INTRADERMAL | Status: AC
  Administered 2013-06-17: 100 ug via INTRADERMAL
  Filled 2013-06-17: qty 0.01

## 2013-06-17 MED ORDER — SODIUM CHLORIDE 0.9 % IV SOLN
Freq: Once | INTRAVENOUS | Status: AC
  Administered 2013-06-17: 12:00:00 via INTRAVENOUS

## 2013-06-17 MED ORDER — SODIUM CHLORIDE 0.9 % IV SOLN
230.0000 mg | Freq: Once | INTRAVENOUS | Status: AC
  Administered 2013-06-17: 230 mg via INTRAVENOUS
  Filled 2013-06-17: qty 23

## 2013-06-17 MED ORDER — DEXAMETHASONE SODIUM PHOSPHATE 20 MG/5ML IJ SOLN
20.0000 mg | Freq: Once | INTRAMUSCULAR | Status: AC
  Administered 2013-06-17: 20 mg via INTRAVENOUS

## 2013-06-17 MED ORDER — DEXAMETHASONE SODIUM PHOSPHATE 20 MG/5ML IJ SOLN
INTRAMUSCULAR | Status: AC
  Filled 2013-06-17: qty 5

## 2013-06-17 MED ORDER — DIPHENHYDRAMINE HCL 50 MG/ML IJ SOLN
INTRAMUSCULAR | Status: AC
  Filled 2013-06-17: qty 1

## 2013-06-17 MED ORDER — FAMOTIDINE IN NACL 20-0.9 MG/50ML-% IV SOLN
INTRAVENOUS | Status: AC
  Filled 2013-06-17: qty 50

## 2013-06-17 MED ORDER — ONDANSETRON 16 MG/50ML IVPB (CHCC)
16.0000 mg | Freq: Once | INTRAVENOUS | Status: AC
  Administered 2013-06-17: 16 mg via INTRAVENOUS

## 2013-06-17 NOTE — Telephone Encounter (Signed)
Per staff message and POF I have scheduled appts.  JMW  

## 2013-06-17 NOTE — Telephone Encounter (Signed)
Gave pt appt for lab and MD , emailed Sharyn Lull regarding chemo for March 2015

## 2013-06-17 NOTE — Patient Instructions (Signed)
[facility] Discharge Instructions for Patients Receiving Chemotherapy  Today you received the following chemotherapy agents: Taxol, Carboplatin  To help prevent nausea and vomiting after your treatment, we encourage you to take your nausea medication as prescribed by your physician.   If you develop nausea and vomiting that is not controlled by your nausea medication, call the clinic.   BELOW ARE SYMPTOMS THAT SHOULD BE REPORTED IMMEDIATELY:  *FEVER GREATER THAN 100.5 F  *CHILLS WITH OR WITHOUT FEVER  NAUSEA AND VOMITING THAT IS NOT CONTROLLED WITH YOUR NAUSEA MEDICATION  *UNUSUAL SHORTNESS OF BREATH  *UNUSUAL BRUISING OR BLEEDING  TENDERNESS IN MOUTH AND THROAT WITH OR WITHOUT PRESENCE OF ULCERS  *URINARY PROBLEMS  *BOWEL PROBLEMS  UNUSUAL RASH Items with * indicate a potential emergency and should be followed up as soon as possible.  Feel free to call the clinic you have any questions or concerns. The clinic phone number is [phone].

## 2013-06-17 NOTE — Progress Notes (Signed)
Provided third and final case of Ensure Plus for patient today.
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
## 2013-06-18 ENCOUNTER — Ambulatory Visit
Admission: RE | Admit: 2013-06-18 | Discharge: 2013-06-18 | Disposition: A | Discharge status: Home / Self Care | Payer: Medicare Other | Source: Ambulatory Visit | Attending: Radiation Oncology | Admitting: Radiation Oncology

## 2013-06-18 VITALS — BP 150/82 | HR 85 | Temp 97.7°F | Wt 151.7 lb

## 2013-06-18 DIAGNOSIS — C3490 Malignant neoplasm of unspecified part of unspecified bronchus or lung: Secondary | ICD-10-CM

## 2013-06-18 MED ORDER — RADIAPLEXRX EX GEL
Freq: Once | CUTANEOUS | Status: AC
  Administered 2013-06-18: 1 via TOPICAL

## 2013-06-18 NOTE — Progress Notes (Signed)
Weekly Management Note Current Dose:4 Gy  Projected Dose:60 Gy   Narrative:  The patient presents for routine under treatment assessment.  CBCT/MVCT images/Port film x-rays were reviewed.  The chart was checked. Doing well. No complaints. Chemo yesterday. Some sore throat this morning.   Physical Findings:  Unchanged  Vitals:  Filed Vitals:   06/18/13 1449  BP: 150/82  Pulse: 85  Temp: 97.7 F (36.5 C)   Weight:  Wt Readings from Last 3 Encounters:  06/18/13 151 lb 11.2 oz (68.811 kg)  06/17/13 149 lb 11.2 oz (67.903 kg)  06/06/13 149 lb 1.6 oz (67.631 kg)   Lab Results  Component Value Date   WBC 6.3 06/17/2013   HGB 11.7* 06/17/2013   HCT 35.1* 06/17/2013   MCV 104.2* 06/17/2013   PLT 191 06/17/2013   Lab Results  Component Value Date   CREATININE 0.7 06/17/2013   BUN 11.6 06/17/2013   NA 136 06/17/2013   K 4.2 06/17/2013   CL 98 12/14/2012   CO2 24 06/17/2013     Impression:  The patient is tolerating radiation.  Plan:  Continue treatment as planned. RN education performed.

## 2013-06-18 NOTE — Addendum Note (Signed)
Encounter addended by: Rebecca Eaton, RN on: 06/18/2013 11:15 AM<BR>     Documentation filed: Charges VN

## 2013-06-18 NOTE — Progress Notes (Signed)
Patient here for weekly assessment of radiation to right lung.Reviewed routine of clinic and side effects of care.Given skin care sheet, Radiation Therapy and You booklet and radiaplex.Informed of main side effects to include esophagitis, skin discoloration and fatigue.Patient able to verbalize some of side effects during teachback.Completed 2 treatments thus far.No evidence of change.

## 2013-06-18 NOTE — Addendum Note (Signed)
Encounter addended by: Deirdre Evener, RN on: 06/18/2013 11:59 AM<BR>     Documentation filed: Charges VN

## 2013-06-19 ENCOUNTER — Telehealth: Payer: Self-pay | Admitting: Internal Medicine

## 2013-06-19 ENCOUNTER — Ambulatory Visit: Payer: Medicare Other

## 2013-06-19 NOTE — Telephone Encounter (Signed)
Called pt and left message regarding chemo for Monday, advised ptm to get appt calendar for February and MArch 2015

## 2013-06-20 ENCOUNTER — Ambulatory Visit
Admission: RE | Admit: 2013-06-20 | Discharge: 2013-06-20 | Disposition: A | Discharge status: Home / Self Care | Payer: Medicare Other | Source: Ambulatory Visit | Attending: Radiation Oncology | Admitting: Radiation Oncology

## 2013-06-21 ENCOUNTER — Ambulatory Visit
Admission: RE | Admit: 2013-06-21 | Discharge: 2013-06-21 | Disposition: A | Discharge status: Home / Self Care | Payer: Medicare Other | Source: Ambulatory Visit | Attending: Radiation Oncology | Admitting: Radiation Oncology

## 2013-06-24 ENCOUNTER — Ambulatory Visit (HOSPITAL_BASED_OUTPATIENT_CLINIC_OR_DEPARTMENT_OTHER): Payer: Medicare Other

## 2013-06-24 ENCOUNTER — Other Ambulatory Visit (HOSPITAL_BASED_OUTPATIENT_CLINIC_OR_DEPARTMENT_OTHER): Payer: Medicare Other

## 2013-06-24 ENCOUNTER — Ambulatory Visit
Admission: RE | Admit: 2013-06-24 | Discharge: 2013-06-24 | Disposition: A | Discharge status: Home / Self Care | Payer: Medicare Other | Source: Ambulatory Visit | Attending: Radiation Oncology | Admitting: Radiation Oncology

## 2013-06-24 VITALS — BP 134/80 | HR 77 | Temp 98.0°F | Resp 16

## 2013-06-24 DIAGNOSIS — C3490 Malignant neoplasm of unspecified part of unspecified bronchus or lung: Secondary | ICD-10-CM

## 2013-06-24 DIAGNOSIS — C3430 Malignant neoplasm of lower lobe, unspecified bronchus or lung: Secondary | ICD-10-CM

## 2013-06-24 DIAGNOSIS — Z5111 Encounter for antineoplastic chemotherapy: Secondary | ICD-10-CM

## 2013-06-24 LAB — CBC WITH DIFFERENTIAL/PLATELET
BASO%: 0.9 % (ref 0.0–2.0)
Basophils Absolute: 0 10*3/uL (ref 0.0–0.1)
EOS%: 2.9 % (ref 0.0–7.0)
Eosinophils Absolute: 0.1 10*3/uL (ref 0.0–0.5)
HCT: 32.9 % — ABNORMAL LOW (ref 38.4–49.9)
HGB: 10.7 g/dL — ABNORMAL LOW (ref 13.0–17.1)
LYMPH%: 28 % (ref 14.0–49.0)
MCH: 33.9 pg — ABNORMAL HIGH (ref 27.2–33.4)
MCHC: 32.5 g/dL (ref 32.0–36.0)
MCV: 104.1 fL — AB (ref 79.3–98.0)
MONO#: 0.5 10*3/uL (ref 0.1–0.9)
MONO%: 13.3 % (ref 0.0–14.0)
NEUT#: 1.9 10*3/uL (ref 1.5–6.5)
NEUT%: 54.9 % (ref 39.0–75.0)
PLATELETS: 112 10*3/uL — AB (ref 140–400)
RBC: 3.16 10*6/uL — ABNORMAL LOW (ref 4.20–5.82)
RDW: 13.9 % (ref 11.0–14.6)
WBC: 3.5 10*3/uL — ABNORMAL LOW (ref 4.0–10.3)
lymph#: 1 10*3/uL (ref 0.9–3.3)
nRBC: 0 % (ref 0–0)

## 2013-06-24 LAB — COMPREHENSIVE METABOLIC PANEL (CC13)
ALT: 32 U/L (ref 0–55)
ANION GAP: 10 meq/L (ref 3–11)
AST: 44 U/L — AB (ref 5–34)
Albumin: 3.5 g/dL (ref 3.5–5.0)
Alkaline Phosphatase: 132 U/L (ref 40–150)
BILIRUBIN TOTAL: 0.42 mg/dL (ref 0.20–1.20)
BUN: 13.2 mg/dL (ref 7.0–26.0)
CO2: 23 meq/L (ref 22–29)
CREATININE: 0.7 mg/dL (ref 0.7–1.3)
Calcium: 9.7 mg/dL (ref 8.4–10.4)
Chloride: 105 mEq/L (ref 98–109)
Glucose: 101 mg/dl (ref 70–140)
Potassium: 4.2 mEq/L (ref 3.5–5.1)
Sodium: 138 mEq/L (ref 136–145)
Total Protein: 7.2 g/dL (ref 6.4–8.3)

## 2013-06-24 MED ORDER — DEXAMETHASONE SODIUM PHOSPHATE 20 MG/5ML IJ SOLN
INTRAMUSCULAR | Status: AC
  Filled 2013-06-24: qty 5

## 2013-06-24 MED ORDER — FAMOTIDINE IN NACL 20-0.9 MG/50ML-% IV SOLN
INTRAVENOUS | Status: AC
  Filled 2013-06-24: qty 50

## 2013-06-24 MED ORDER — DIPHENHYDRAMINE HCL 50 MG/ML IJ SOLN
INTRAMUSCULAR | Status: AC
  Filled 2013-06-24: qty 1

## 2013-06-24 MED ORDER — SODIUM CHLORIDE 0.9 % IV SOLN
45.0000 mg/m2 | Freq: Once | INTRAVENOUS | Status: AC
  Administered 2013-06-24: 78 mg via INTRAVENOUS
  Filled 2013-06-24: qty 13

## 2013-06-24 MED ORDER — CARBOPLATIN CHEMO INTRADERMAL TEST DOSE 100MCG/0.02ML
100.0000 ug | Freq: Once | INTRADERMAL | Status: AC
  Administered 2013-06-24: 100 ug via INTRADERMAL
  Filled 2013-06-24: qty 0.01

## 2013-06-24 MED ORDER — SODIUM CHLORIDE 0.9 % IV SOLN
233.8000 mg | Freq: Once | INTRAVENOUS | Status: AC
  Administered 2013-06-24: 230 mg via INTRAVENOUS
  Filled 2013-06-24: qty 23

## 2013-06-24 MED ORDER — DEXAMETHASONE SODIUM PHOSPHATE 20 MG/5ML IJ SOLN
20.0000 mg | Freq: Once | INTRAMUSCULAR | Status: AC
  Administered 2013-06-24: 20 mg via INTRAVENOUS

## 2013-06-24 MED ORDER — ONDANSETRON 16 MG/50ML IVPB (CHCC)
16.0000 mg | Freq: Once | INTRAVENOUS | Status: AC
  Administered 2013-06-24: 16 mg via INTRAVENOUS

## 2013-06-24 MED ORDER — DIPHENHYDRAMINE HCL 50 MG/ML IJ SOLN
50.0000 mg | Freq: Once | INTRAMUSCULAR | Status: AC
  Administered 2013-06-24: 50 mg via INTRAVENOUS

## 2013-06-24 MED ORDER — FAMOTIDINE IN NACL 20-0.9 MG/50ML-% IV SOLN
20.0000 mg | Freq: Once | INTRAVENOUS | Status: AC
  Administered 2013-06-24: 20 mg via INTRAVENOUS

## 2013-06-24 MED ORDER — ONDANSETRON 16 MG/50ML IVPB (CHCC)
INTRAVENOUS | Status: AC
  Filled 2013-06-24: qty 16

## 2013-06-24 MED ORDER — SODIUM CHLORIDE 0.9 % IV SOLN
Freq: Once | INTRAVENOUS | Status: AC
  Administered 2013-06-24: 14:00:00 via INTRAVENOUS

## 2013-06-24 NOTE — Patient Instructions (Signed)
[facility] Discharge Instructions for Patients Receiving Chemotherapy  Today you received the following chemotherapy agents: Taxol, Carboplatin  To help prevent nausea and vomiting after your treatment, we encourage you to take your nausea medication as prescribed by your physician.   If you develop nausea and vomiting that is not controlled by your nausea medication, call the clinic.   BELOW ARE SYMPTOMS THAT SHOULD BE REPORTED IMMEDIATELY:  *FEVER GREATER THAN 100.5 F  *CHILLS WITH OR WITHOUT FEVER  NAUSEA AND VOMITING THAT IS NOT CONTROLLED WITH YOUR NAUSEA MEDICATION  *UNUSUAL SHORTNESS OF BREATH  *UNUSUAL BRUISING OR BLEEDING  TENDERNESS IN MOUTH AND THROAT WITH OR WITHOUT PRESENCE OF ULCERS  *URINARY PROBLEMS  *BOWEL PROBLEMS  UNUSUAL RASH Items with * indicate a potential emergency and should be followed up as soon as possible.  Feel free to call the clinic you have any questions or concerns. The clinic phone number is [phone].

## 2013-06-25 ENCOUNTER — Ambulatory Visit
Admission: RE | Admit: 2013-06-25 | Discharge: 2013-06-25 | Disposition: A | Discharge status: Home / Self Care | Payer: Medicare Other | Source: Ambulatory Visit | Attending: Radiation Oncology | Admitting: Radiation Oncology

## 2013-06-25 ENCOUNTER — Ambulatory Visit: Payer: Medicare Other | Admitting: Radiation Oncology

## 2013-06-25 VITALS — BP 158/76 | HR 100 | Temp 97.7°F | Resp 20 | Wt 155.2 lb

## 2013-06-25 DIAGNOSIS — C3490 Malignant neoplasm of unspecified part of unspecified bronchus or lung: Secondary | ICD-10-CM

## 2013-06-25 NOTE — Progress Notes (Signed)
Weekly Management Note Current Dose:12 Gy  Projected Dose:60 Gy   Narrative:  The patient presents for routine under treatment assessment.  CBCT/MVCT images/Port film x-rays were reviewed.  The chart was checked. Doing well. No complaints. No dysphagia. No fatigue.   Physical Findings:  Unchanged  Vitals:  Filed Vitals:   06/25/13 1553  BP: 158/76  Pulse: 100  Temp: 97.7 F (36.5 C)  Resp: 20   Weight:  Wt Readings from Last 3 Encounters:  06/25/13 155 lb 3.2 oz (70.398 kg)  06/18/13 151 lb 11.2 oz (68.811 kg)  06/17/13 149 lb 11.2 oz (67.903 kg)   Lab Results  Component Value Date   WBC 3.5* 06/24/2013   HGB 10.7* 06/24/2013   HCT 32.9* 06/24/2013   MCV 104.1* 06/24/2013   PLT 112* 06/24/2013   Lab Results  Component Value Date   CREATININE 0.7 06/24/2013   BUN 13.2 06/24/2013   NA 138 06/24/2013   K 4.2 06/24/2013   CL 98 12/14/2012   CO2 23 06/24/2013     Impression:  The patient is tolerating radiation.  Plan:  Continue treatment as planned.

## 2013-06-25 NOTE — Progress Notes (Signed)
Weekly assessment of radiation to right lung.completed 6 of 30 treatments.Denies pain or shortness of breath.Has occasional productive cough of pink tinged phlegm.

## 2013-06-26 ENCOUNTER — Ambulatory Visit
Admission: RE | Admit: 2013-06-26 | Discharge: 2013-06-26 | Disposition: A | Discharge status: Home / Self Care | Payer: Medicare Other | Source: Ambulatory Visit | Attending: Radiation Oncology | Admitting: Radiation Oncology

## 2013-06-27 ENCOUNTER — Ambulatory Visit
Admission: RE | Admit: 2013-06-27 | Discharge: 2013-06-27 | Disposition: A | Discharge status: Home / Self Care | Payer: Medicare Other | Source: Ambulatory Visit | Attending: Radiation Oncology | Admitting: Radiation Oncology

## 2013-06-28 ENCOUNTER — Ambulatory Visit
Admission: RE | Admit: 2013-06-28 | Discharge: 2013-06-28 | Disposition: A | Discharge status: Home / Self Care | Payer: Medicare Other | Source: Ambulatory Visit | Attending: Radiation Oncology | Admitting: Radiation Oncology

## 2013-07-01 ENCOUNTER — Telehealth: Payer: Self-pay | Admitting: Internal Medicine

## 2013-07-01 ENCOUNTER — Ambulatory Visit
Admission: RE | Admit: 2013-07-01 | Discharge: 2013-07-01 | Disposition: A | Discharge status: Home / Self Care | Payer: Medicare Other | Source: Ambulatory Visit | Attending: Radiation Oncology | Admitting: Radiation Oncology

## 2013-07-01 ENCOUNTER — Other Ambulatory Visit (HOSPITAL_BASED_OUTPATIENT_CLINIC_OR_DEPARTMENT_OTHER): Payer: Medicare Other

## 2013-07-01 ENCOUNTER — Telehealth: Payer: Self-pay | Admitting: *Deleted

## 2013-07-01 ENCOUNTER — Encounter: Payer: Self-pay | Admitting: Physician Assistant

## 2013-07-01 ENCOUNTER — Ambulatory Visit (HOSPITAL_BASED_OUTPATIENT_CLINIC_OR_DEPARTMENT_OTHER): Payer: Medicare Other | Admitting: Physician Assistant

## 2013-07-01 ENCOUNTER — Ambulatory Visit (HOSPITAL_BASED_OUTPATIENT_CLINIC_OR_DEPARTMENT_OTHER): Payer: Medicare Other

## 2013-07-01 VITALS — BP 136/84 | HR 79 | Temp 98.1°F | Resp 18 | Ht 66.0 in | Wt 150.2 lb

## 2013-07-01 DIAGNOSIS — C3430 Malignant neoplasm of lower lobe, unspecified bronchus or lung: Secondary | ICD-10-CM

## 2013-07-01 DIAGNOSIS — Z5111 Encounter for antineoplastic chemotherapy: Secondary | ICD-10-CM

## 2013-07-01 DIAGNOSIS — C3490 Malignant neoplasm of unspecified part of unspecified bronchus or lung: Secondary | ICD-10-CM

## 2013-07-01 LAB — COMPREHENSIVE METABOLIC PANEL (CC13)
ALBUMIN: 3.8 g/dL (ref 3.5–5.0)
ALT: 57 U/L — ABNORMAL HIGH (ref 0–55)
ANION GAP: 7 meq/L (ref 3–11)
AST: 69 U/L — ABNORMAL HIGH (ref 5–34)
Alkaline Phosphatase: 170 U/L — ABNORMAL HIGH (ref 40–150)
BUN: 9.2 mg/dL (ref 7.0–26.0)
CHLORIDE: 102 meq/L (ref 98–109)
CO2: 27 meq/L (ref 22–29)
CREATININE: 0.7 mg/dL (ref 0.7–1.3)
Calcium: 10 mg/dL (ref 8.4–10.4)
GLUCOSE: 88 mg/dL (ref 70–140)
POTASSIUM: 4.4 meq/L (ref 3.5–5.1)
Sodium: 136 mEq/L (ref 136–145)
Total Bilirubin: 0.47 mg/dL (ref 0.20–1.20)
Total Protein: 7.8 g/dL (ref 6.4–8.3)

## 2013-07-01 LAB — CBC WITH DIFFERENTIAL/PLATELET
BASO%: 0.7 % (ref 0.0–2.0)
BASOS ABS: 0 10*3/uL (ref 0.0–0.1)
EOS ABS: 0.1 10*3/uL (ref 0.0–0.5)
EOS%: 2.2 % (ref 0.0–7.0)
HCT: 32.1 % — ABNORMAL LOW (ref 38.4–49.9)
HGB: 10.6 g/dL — ABNORMAL LOW (ref 13.0–17.1)
LYMPH%: 25.9 % (ref 14.0–49.0)
MCH: 33.9 pg — ABNORMAL HIGH (ref 27.2–33.4)
MCHC: 33 g/dL (ref 32.0–36.0)
MCV: 102.6 fL — ABNORMAL HIGH (ref 79.3–98.0)
MONO#: 0.5 10*3/uL (ref 0.1–0.9)
MONO%: 16.4 % — ABNORMAL HIGH (ref 0.0–14.0)
NEUT%: 54.8 % (ref 39.0–75.0)
NEUTROS ABS: 1.5 10*3/uL (ref 1.5–6.5)
PLATELETS: 96 10*3/uL — AB (ref 140–400)
RBC: 3.13 10*6/uL — ABNORMAL LOW (ref 4.20–5.82)
RDW: 14 % (ref 11.0–14.6)
WBC: 2.7 10*3/uL — ABNORMAL LOW (ref 4.0–10.3)
lymph#: 0.7 10*3/uL — ABNORMAL LOW (ref 0.9–3.3)
nRBC: 0 % (ref 0–0)

## 2013-07-01 MED ORDER — FAMOTIDINE IN NACL 20-0.9 MG/50ML-% IV SOLN
INTRAVENOUS | Status: AC
  Filled 2013-07-01: qty 50

## 2013-07-01 MED ORDER — DEXAMETHASONE SODIUM PHOSPHATE 20 MG/5ML IJ SOLN
20.0000 mg | Freq: Once | INTRAMUSCULAR | Status: AC
  Administered 2013-07-01: 20 mg via INTRAVENOUS

## 2013-07-01 MED ORDER — DIPHENHYDRAMINE HCL 50 MG/ML IJ SOLN
50.0000 mg | Freq: Once | INTRAMUSCULAR | Status: AC
  Administered 2013-07-01: 50 mg via INTRAVENOUS

## 2013-07-01 MED ORDER — ONDANSETRON 16 MG/50ML IVPB (CHCC)
INTRAVENOUS | Status: AC
  Filled 2013-07-01: qty 16

## 2013-07-01 MED ORDER — SODIUM CHLORIDE 0.9 % IV SOLN
45.0000 mg/m2 | Freq: Once | INTRAVENOUS | Status: AC
  Administered 2013-07-01: 78 mg via INTRAVENOUS
  Filled 2013-07-01: qty 13

## 2013-07-01 MED ORDER — DIPHENHYDRAMINE HCL 50 MG/ML IJ SOLN
INTRAMUSCULAR | Status: AC
  Filled 2013-07-01: qty 1

## 2013-07-01 MED ORDER — SODIUM CHLORIDE 0.9 % IV SOLN
Freq: Once | INTRAVENOUS | Status: AC
  Administered 2013-07-01: 13:00:00 via INTRAVENOUS

## 2013-07-01 MED ORDER — DEXAMETHASONE SODIUM PHOSPHATE 20 MG/5ML IJ SOLN
INTRAMUSCULAR | Status: AC
  Filled 2013-07-01: qty 5

## 2013-07-01 MED ORDER — SODIUM CHLORIDE 0.9 % IV SOLN
230.0000 mg | Freq: Once | INTRAVENOUS | Status: AC
  Administered 2013-07-01: 230 mg via INTRAVENOUS
  Filled 2013-07-01: qty 23

## 2013-07-01 MED ORDER — FAMOTIDINE IN NACL 20-0.9 MG/50ML-% IV SOLN
20.0000 mg | Freq: Once | INTRAVENOUS | Status: AC
  Administered 2013-07-01: 20 mg via INTRAVENOUS

## 2013-07-01 MED ORDER — ONDANSETRON 16 MG/50ML IVPB (CHCC)
16.0000 mg | Freq: Once | INTRAVENOUS | Status: AC
  Administered 2013-07-01: 16 mg via INTRAVENOUS

## 2013-07-01 MED ORDER — CARBOPLATIN CHEMO INTRADERMAL TEST DOSE 100MCG/0.02ML
100.0000 ug | Freq: Once | INTRADERMAL | Status: AC
  Administered 2013-07-01: 100 ug via INTRADERMAL
  Filled 2013-07-01: qty 0.02

## 2013-07-01 NOTE — Telephone Encounter (Signed)
I have adjusted appt for 3/16
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
## 2013-07-01 NOTE — Progress Notes (Signed)
OK to treat with platelet ct of 96 per AWynetta Emery PA.

## 2013-07-01 NOTE — Patient Instructions (Signed)
Discontinue taking the 325 mg aspirin, you may resume taking the 81 mg aspirin daily Continue your course of concurrent chemoradiation as scheduled Followup in 2 weeks

## 2013-07-01 NOTE — Patient Instructions (Signed)
[facility]charge [facility] for [facility] Receiving Chemotherapy  Today you received the following chemotherapy agents:  Taxol and Carboplatin  To help prevent nausea and vomiting after your treatment, we encourage you to take your nausea medication as ordered per MD.   If you develop nausea and vomiting that is not controlled by your nausea medication, call the clinic.   BELOW ARE SYMPTOMS THAT SHOULD BE REPORTED IMMEDIATELY:  *FEVER GREATER THAN 100.5 F  *CHILLS WITH OR WITHOUT FEVER  NAUSEA AND VOMITING THAT IS NOT CONTROLLED WITH YOUR NAUSEA MEDICATION  *UNUSUAL SHORTNESS OF BREATH  *UNUSUAL BRUISING OR BLEEDING  TENDERNESS IN MOUTH AND THROAT WITH OR WITHOUT PRESENCE OF ULCERS  *URINARY PROBLEMS  *BOWEL PROBLEMS  UNUSUAL RASH Items with * indicate a potential emergency and should be followed up as soon as possible.  Feel free to call the clinic you have any questions or concerns. The clinic phone number is [phone].    

## 2013-07-01 NOTE — Telephone Encounter (Signed)
Gave pt appt for lab, md and chemo for March 2015

## 2013-07-02 ENCOUNTER — Ambulatory Visit
Admission: RE | Admit: 2013-07-02 | Discharge: 2013-07-02 | Disposition: A | Discharge status: Home / Self Care | Payer: Medicare Other | Source: Ambulatory Visit | Attending: Radiation Oncology | Admitting: Radiation Oncology

## 2013-07-02 ENCOUNTER — Encounter: Payer: Self-pay | Admitting: Radiation Oncology

## 2013-07-02 VITALS — BP 127/81 | HR 92 | Temp 98.1°F | Ht 66.0 in | Wt 153.8 lb

## 2013-07-02 DIAGNOSIS — C3490 Malignant neoplasm of unspecified part of unspecified bronchus or lung: Secondary | ICD-10-CM

## 2013-07-02 NOTE — Progress Notes (Signed)
Raymond Gregory has received 11 fractions to his right lung.  He denies any pain, sob, nor fatigue.  Reports ocassional burping.  Accompanied by family.

## 2013-07-02 NOTE — Progress Notes (Signed)
Weekly Management Note Current Dose:22 Gy  Projected Dose:60 Gy   Narrative:  The patient presents for routine under treatment assessment.  CBCT/MVCT images/Port film x-rays were reviewed.  The chart was checked. Doing well. No complaints. No swallowing issues. No skin changes  Physical Findings:  No skin changes in front or back. Appears well. Alert.   Vitals:  Filed Vitals:   07/02/13 1701  BP: 127/81  Pulse: 92  Temp: 98.1 F (36.7 C)   Weight:  Wt Readings from Last 3 Encounters:  07/02/13 153 lb 12.8 oz (69.763 kg)  07/01/13 150 lb 3.2 oz (68.13 kg)  06/25/13 155 lb 3.2 oz (70.398 kg)   Lab Results  Component Value Date   WBC 2.7* 07/01/2013   HGB 10.6* 07/01/2013   HCT 32.1* 07/01/2013   MCV 102.6* 07/01/2013   PLT 96* 07/01/2013   Lab Results  Component Value Date   CREATININE 0.7 07/01/2013   BUN 9.2 07/01/2013   NA 136 07/01/2013   K 4.4 07/01/2013   CL 98 12/14/2012   CO2 27 07/01/2013     Impression:  The patient is tolerating radiation.  Plan:  Continue treatment as planned. Continue radiaplex. Discussed treatment of dysphagia.

## 2013-07-03 ENCOUNTER — Ambulatory Visit
Admission: RE | Admit: 2013-07-03 | Discharge: 2013-07-03 | Disposition: A | Discharge status: Home / Self Care | Payer: Medicare Other | Source: Ambulatory Visit | Attending: Radiation Oncology | Admitting: Radiation Oncology

## 2013-07-03 ENCOUNTER — Telehealth: Payer: Self-pay | Admitting: *Deleted

## 2013-07-03 NOTE — Telephone Encounter (Signed)
Called patient to inform of nutrition appt. For 07-09-13, spoke with patient and he is aware of this appt.

## 2013-07-04 ENCOUNTER — Ambulatory Visit
Admission: RE | Admit: 2013-07-04 | Discharge: 2013-07-04 | Disposition: A | Discharge status: Home / Self Care | Payer: Medicare Other | Source: Ambulatory Visit | Attending: Radiation Oncology | Admitting: Radiation Oncology

## 2013-07-05 ENCOUNTER — Telehealth: Payer: Self-pay | Admitting: *Deleted

## 2013-07-05 ENCOUNTER — Ambulatory Visit
Admission: RE | Admit: 2013-07-05 | Discharge: 2013-07-05 | Disposition: A | Discharge status: Home / Self Care | Payer: Medicare Other | Source: Ambulatory Visit | Attending: Radiation Oncology | Admitting: Radiation Oncology

## 2013-07-05 NOTE — Telephone Encounter (Signed)
Called patient to inform of nutrition appt. For 07-09-13 @ 3:15 pm with Raymond Gregory, spoke with patient and he is aware of this appt.

## 2013-07-08 ENCOUNTER — Other Ambulatory Visit (HOSPITAL_BASED_OUTPATIENT_CLINIC_OR_DEPARTMENT_OTHER): Payer: Medicare Other

## 2013-07-08 ENCOUNTER — Ambulatory Visit: Payer: Medicare Other

## 2013-07-08 ENCOUNTER — Ambulatory Visit
Admission: RE | Admit: 2013-07-08 | Discharge: 2013-07-08 | Disposition: A | Discharge status: Home / Self Care | Payer: Medicare Other | Source: Ambulatory Visit | Attending: Radiation Oncology | Admitting: Radiation Oncology

## 2013-07-08 DIAGNOSIS — C3490 Malignant neoplasm of unspecified part of unspecified bronchus or lung: Secondary | ICD-10-CM

## 2013-07-08 DIAGNOSIS — C3430 Malignant neoplasm of lower lobe, unspecified bronchus or lung: Secondary | ICD-10-CM

## 2013-07-08 LAB — CBC WITH DIFFERENTIAL/PLATELET
BASO%: 0.9 % (ref 0.0–2.0)
BASOS ABS: 0 10*3/uL (ref 0.0–0.1)
EOS%: 1.7 % (ref 0.0–7.0)
Eosinophils Absolute: 0 10*3/uL (ref 0.0–0.5)
HEMATOCRIT: 31.4 % — AB (ref 38.4–49.9)
HGB: 10.5 g/dL — ABNORMAL LOW (ref 13.0–17.1)
LYMPH%: 24.3 % (ref 14.0–49.0)
MCH: 34.2 pg — AB (ref 27.2–33.4)
MCHC: 33.4 g/dL (ref 32.0–36.0)
MCV: 102.3 fL — ABNORMAL HIGH (ref 79.3–98.0)
MONO#: 0.5 10*3/uL (ref 0.1–0.9)
MONO%: 21.3 % — ABNORMAL HIGH (ref 0.0–14.0)
NEUT#: 1.2 10*3/uL — ABNORMAL LOW (ref 1.5–6.5)
NEUT%: 51.8 % (ref 39.0–75.0)
PLATELETS: 73 10*3/uL — AB (ref 140–400)
RBC: 3.07 10*6/uL — ABNORMAL LOW (ref 4.20–5.82)
RDW: 14 % (ref 11.0–14.6)
WBC: 2.3 10*3/uL — AB (ref 4.0–10.3)
lymph#: 0.6 10*3/uL — ABNORMAL LOW (ref 0.9–3.3)
nRBC: 0 % (ref 0–0)

## 2013-07-08 NOTE — Progress Notes (Signed)
Chemotherapy held per Dr. Julien Nordmann r/t Platelets 73, ANC 1.2.

## 2013-07-09 ENCOUNTER — Ambulatory Visit
Admission: RE | Admit: 2013-07-09 | Discharge: 2013-07-09 | Disposition: A | Discharge status: Home / Self Care | Payer: Medicare Other | Source: Ambulatory Visit | Attending: Radiation Oncology | Admitting: Radiation Oncology

## 2013-07-09 ENCOUNTER — Ambulatory Visit: Payer: Medicare Other | Admitting: Nutrition

## 2013-07-09 VITALS — BP 129/85 | HR 107 | Temp 97.9°F | Resp 20 | Wt 149.3 lb

## 2013-07-09 DIAGNOSIS — C3490 Malignant neoplasm of unspecified part of unspecified bronchus or lung: Secondary | ICD-10-CM

## 2013-07-09 MED ORDER — SUCRALFATE 1 GM/10ML PO SUSP: 1.0000 g | Freq: Three times a day (TID) | ORAL | Status: DC

## 2013-07-09 MED ORDER — HYDROCODONE-ACETAMINOPHEN 7.5-325 MG/15ML PO SOLN: 10.0000 mL | Freq: Four times a day (QID) | ORAL | Status: DC | PRN

## 2013-07-09 NOTE — Progress Notes (Signed)
[facility]     Rexene Edison, M.D. [place], [place] [address]               Blair Promise, M.D., Ph.D. Phone: [phone]      [place] A. Tammi Klippel, M.D. Fax: [phone]      Jodelle Gross, M.D., Ph.D.         Thea Silversmith, M.D.         Wyvonnia Lora, M.D Weekly Treatment Management Note  Name: Raymond Gregory     MRN: [id number]        CSN: [id number] Date: 07/09/2013      DOB: March 21, 1951  CC: Marrion Coy, MD         [facility]    Status: Outpatient  Diagnosis: The encounter diagnosis was Lung cancer.  Current Dose: 32  Current Fraction: 16  Planned Dose: 60 Gy  Narrative: Raymond Gregory was seen today for weekly treatment management. The chart was checked and MVCT  were reviewed. The patient is experiencing esophageal symptoms. He did meet with the nutritionist earlier today and was given recommendations to help manage  his decreased by mouth intake.  I have given the patient a prescription for Carafate suspension as well as hydrocodone in liquid form.  Review of patient's allergies indicates no known allergies. Current Outpatient Prescriptions  Medication Sig Dispense Refill  . aspirin 81 MG tablet Take 81 mg by mouth daily as needed (for analgesic).       Marland Kitchen ondansetron (ZOFRAN) 8 MG tablet Take 1 tablet (8 mg total) by mouth every 8 (eight) hours as needed for nausea.  20 tablet  3  . prochlorperazine (COMPAZINE) 10 MG tablet Take 1 tablet (10 mg total) by mouth every 6 (six) hours as needed.  60 tablet  0  . HYDROcodone-acetaminophen (HYCET) 7.5-325 mg/15 ml solution Take 10 mLs by mouth 4 (four) times daily as needed for moderate pain.  473 mL  0  . sucralfate (CARAFATE) 1 GM/10ML suspension Take 10 mLs (1 g total) by mouth 4 (four) times daily -  with meals and at bedtime.  420 mL  0   No current facility-administered medications for this encounter.   Labs:  Lab Results  Component Value Date   WBC  2.3* 07/08/2013   HGB 10.5* 07/08/2013   HCT 31.4* 07/08/2013   MCV 102.3* 07/08/2013   PLT 73* 07/08/2013   Lab Results  Component Value Date   CREATININE 0.7 07/01/2013   BUN 9.2 07/01/2013   NA 136 07/01/2013   K 4.4 07/01/2013   CL 98 12/14/2012   CO2 27 07/01/2013   Lab Results  Component Value Date   ALT 57* 07/01/2013   AST 69* 07/01/2013   BILITOT 0.47 07/01/2013    Physical Examination:  Filed Vitals:   07/09/13 1621  BP: 129/85  Pulse: 107  Temp: 97.9 F (36.6 C)  Resp: 20    Wt Readings from Last 3 Encounters:  07/09/13 149 lb 4.8 oz (67.722 kg)  07/02/13 153 lb 12.8 oz (69.763 kg)  07/01/13 150 lb 3.2 oz (68.13 kg)     Lungs - Normal respiratory effort, chest expands symmetrically. Lungs are clear to auscultation, no crackles or wheezes.  Heart has regular rhythm and rate  Abdomen is soft and non tender with normal bowel sounds  Assessment:  Patient tolerating treatments well except for above issues  Plan: Continue treatment per original  radiation prescription

## 2013-07-09 NOTE — Progress Notes (Signed)
Patient and wife present to nutrition appointment.  Patient is familiar to me from previous contacts.  He is a 63 year old male diagnosed with non-small cell lung cancer receiving concurrent chemoradiation therapy.  Patient reports he has recently developed painful swallowing.  He tolerates only liquids and appears to tolerate thicker liquids better than thin liquids.  He is not taking any medications for painful swallowing.  Weight is actually increased to 153.8 pounds March 3 from 142.3 pounds November 2014.  Patient is willing to try pured foods and increase oral nutrition supplements.  Nutrition diagnosis: Swallowing difficulty related to radiation treatment for non-small cell lung cancer as evidenced by patient's report of decreased solid foods and prolonged feeding time.  Intervention: Patient was educated to increase Ensure Plus or boost +4 times a day.  In addition, patient was educated on strategies for pureing foods and adding liquids to achieve the right consistency for easier swallowing.  Provided patient with fact sheets, complementary oral nutrition supplements, and a cookbook.  Questions were answered.  Teach back method used.  Monitoring, evaluation, goals: Patient to increase oral intake to promote weight maintenance and continued healing throughout treatment.  Next visit: Patient will contact me if he requires nutrition followup.

## 2013-07-09 NOTE — Progress Notes (Signed)
Patient here for weekly assessment of radiation of right lung.Increased esophageal pain over the last week making it difficult to swallow. 4 lb wt loss in one week.Seen by nutritionist today and educated on meal strategies and ensure plus.Patient will need pain medication.

## 2013-07-10 ENCOUNTER — Ambulatory Visit
Admission: RE | Admit: 2013-07-10 | Discharge: 2013-07-10 | Disposition: A | Discharge status: Home / Self Care | Payer: Medicare Other | Source: Ambulatory Visit | Attending: Radiation Oncology | Admitting: Radiation Oncology

## 2013-07-11 ENCOUNTER — Ambulatory Visit
Admission: RE | Admit: 2013-07-11 | Discharge: 2013-07-11 | Disposition: A | Discharge status: Home / Self Care | Payer: Medicare Other | Source: Ambulatory Visit | Attending: Radiation Oncology | Admitting: Radiation Oncology

## 2013-07-12 ENCOUNTER — Ambulatory Visit
Admission: RE | Admit: 2013-07-12 | Discharge: 2013-07-12 | Disposition: A | Discharge status: Home / Self Care | Payer: Medicare Other | Source: Ambulatory Visit | Attending: Radiation Oncology | Admitting: Radiation Oncology

## 2013-07-15 ENCOUNTER — Ambulatory Visit (HOSPITAL_BASED_OUTPATIENT_CLINIC_OR_DEPARTMENT_OTHER): Payer: Medicare Other

## 2013-07-15 ENCOUNTER — Ambulatory Visit (HOSPITAL_BASED_OUTPATIENT_CLINIC_OR_DEPARTMENT_OTHER): Payer: Medicare Other | Admitting: Internal Medicine

## 2013-07-15 ENCOUNTER — Ambulatory Visit
Admission: RE | Admit: 2013-07-15 | Discharge: 2013-07-15 | Disposition: A | Discharge status: Home / Self Care | Payer: Medicare Other | Source: Ambulatory Visit | Attending: Radiation Oncology | Admitting: Radiation Oncology

## 2013-07-15 ENCOUNTER — Other Ambulatory Visit (HOSPITAL_BASED_OUTPATIENT_CLINIC_OR_DEPARTMENT_OTHER): Payer: Medicare Other

## 2013-07-15 ENCOUNTER — Telehealth: Payer: Self-pay | Admitting: Internal Medicine

## 2013-07-15 ENCOUNTER — Encounter: Payer: Self-pay | Admitting: Internal Medicine

## 2013-07-15 DIAGNOSIS — C3430 Malignant neoplasm of lower lobe, unspecified bronchus or lung: Secondary | ICD-10-CM

## 2013-07-15 DIAGNOSIS — C3490 Malignant neoplasm of unspecified part of unspecified bronchus or lung: Secondary | ICD-10-CM

## 2013-07-15 DIAGNOSIS — R070 Pain in throat: Secondary | ICD-10-CM

## 2013-07-15 DIAGNOSIS — Z5111 Encounter for antineoplastic chemotherapy: Secondary | ICD-10-CM

## 2013-07-15 LAB — CBC WITH DIFFERENTIAL/PLATELET
BASO%: 0.8 % (ref 0.0–2.0)
BASOS ABS: 0 10*3/uL (ref 0.0–0.1)
EOS%: 1.2 % (ref 0.0–7.0)
Eosinophils Absolute: 0 10*3/uL (ref 0.0–0.5)
HEMATOCRIT: 31.8 % — AB (ref 38.4–49.9)
HEMOGLOBIN: 10.3 g/dL — AB (ref 13.0–17.1)
LYMPH#: 0.6 10*3/uL — AB (ref 0.9–3.3)
LYMPH%: 24.2 % (ref 14.0–49.0)
MCH: 33.4 pg (ref 27.2–33.4)
MCHC: 32.4 g/dL (ref 32.0–36.0)
MCV: 103.2 fL — ABNORMAL HIGH (ref 79.3–98.0)
MONO#: 0.8 10*3/uL (ref 0.1–0.9)
MONO%: 30.1 % — ABNORMAL HIGH (ref 0.0–14.0)
NEUT#: 1.1 10*3/uL — ABNORMAL LOW (ref 1.5–6.5)
NEUT%: 43.7 % (ref 39.0–75.0)
Platelets: 112 10*3/uL — ABNORMAL LOW (ref 140–400)
RBC: 3.08 10*6/uL — ABNORMAL LOW (ref 4.20–5.82)
RDW: 14.5 % (ref 11.0–14.6)
WBC: 2.6 10*3/uL — AB (ref 4.0–10.3)
nRBC: 0 % (ref 0–0)

## 2013-07-15 LAB — COMPREHENSIVE METABOLIC PANEL (CC13)
ALBUMIN: 3.6 g/dL (ref 3.5–5.0)
ALK PHOS: 252 U/L — AB (ref 40–150)
ALT: 41 U/L (ref 0–55)
AST: 69 U/L — AB (ref 5–34)
Anion Gap: 10 mEq/L (ref 3–11)
BUN: 9.1 mg/dL (ref 7.0–26.0)
CO2: 23 mEq/L (ref 22–29)
Calcium: 9.9 mg/dL (ref 8.4–10.4)
Chloride: 108 mEq/L (ref 98–109)
Creatinine: 0.7 mg/dL (ref 0.7–1.3)
Glucose: 105 mg/dl (ref 70–140)
POTASSIUM: 3.9 meq/L (ref 3.5–5.1)
Sodium: 141 mEq/L (ref 136–145)
TOTAL PROTEIN: 7.7 g/dL (ref 6.4–8.3)
Total Bilirubin: 0.87 mg/dL (ref 0.20–1.20)

## 2013-07-15 MED ORDER — DEXAMETHASONE SODIUM PHOSPHATE 20 MG/5ML IJ SOLN
INTRAMUSCULAR | Status: AC
  Filled 2013-07-15: qty 5

## 2013-07-15 MED ORDER — DIPHENHYDRAMINE HCL 50 MG/ML IJ SOLN
INTRAMUSCULAR | Status: AC
  Filled 2013-07-15: qty 1

## 2013-07-15 MED ORDER — ONDANSETRON 16 MG/50ML IVPB (CHCC)
INTRAVENOUS | Status: AC
  Filled 2013-07-15: qty 16

## 2013-07-15 MED ORDER — DIPHENHYDRAMINE HCL 50 MG/ML IJ SOLN
50.0000 mg | Freq: Once | INTRAMUSCULAR | Status: AC
  Administered 2013-07-15: 50 mg via INTRAVENOUS

## 2013-07-15 MED ORDER — SODIUM CHLORIDE 0.9 % IV SOLN
45.0000 mg/m2 | Freq: Once | INTRAVENOUS | Status: AC
  Administered 2013-07-15: 78 mg via INTRAVENOUS
  Filled 2013-07-15: qty 13

## 2013-07-15 MED ORDER — CARBOPLATIN CHEMO INTRADERMAL TEST DOSE 100MCG/0.02ML
100.0000 ug | Freq: Once | INTRADERMAL | Status: AC
  Administered 2013-07-15: 100 ug via INTRADERMAL
  Filled 2013-07-15: qty 0.01

## 2013-07-15 MED ORDER — ONDANSETRON 16 MG/50ML IVPB (CHCC)
16.0000 mg | Freq: Once | INTRAVENOUS | Status: AC
  Administered 2013-07-15: 16 mg via INTRAVENOUS

## 2013-07-15 MED ORDER — SODIUM CHLORIDE 0.9 % IV SOLN
Freq: Once | INTRAVENOUS | Status: AC
  Administered 2013-07-15: 10:00:00 via INTRAVENOUS

## 2013-07-15 MED ORDER — DEXAMETHASONE SODIUM PHOSPHATE 20 MG/5ML IJ SOLN
20.0000 mg | Freq: Once | INTRAMUSCULAR | Status: AC
  Administered 2013-07-15: 20 mg via INTRAVENOUS

## 2013-07-15 MED ORDER — SODIUM CHLORIDE 0.9 % IV SOLN
233.8000 mg | Freq: Once | INTRAVENOUS | Status: AC
  Administered 2013-07-15: 230 mg via INTRAVENOUS
  Filled 2013-07-15: qty 23

## 2013-07-15 MED ORDER — FAMOTIDINE IN NACL 20-0.9 MG/50ML-% IV SOLN
20.0000 mg | Freq: Once | INTRAVENOUS | Status: AC
  Administered 2013-07-15: 20 mg via INTRAVENOUS

## 2013-07-15 MED ORDER — FAMOTIDINE IN NACL 20-0.9 MG/50ML-% IV SOLN
INTRAVENOUS | Status: AC
  Filled 2013-07-15: qty 50
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
## 2013-07-15 NOTE — Patient Instructions (Signed)
[facility] Discharge Instructions for Patients Receiving Chemotherapy  Today you received the following chemotherapy agents: Carboplatin, Taxol  To help prevent nausea and vomiting after your treatment, we encourage you to take your nausea medication as prescribed by your physician.    If you develop nausea and vomiting that is not controlled by your nausea medication, call the clinic.   BELOW ARE SYMPTOMS THAT SHOULD BE REPORTED IMMEDIATELY:  *FEVER GREATER THAN 100.5 F  *CHILLS WITH OR WITHOUT FEVER  NAUSEA AND VOMITING THAT IS NOT CONTROLLED WITH YOUR NAUSEA MEDICATION  *UNUSUAL SHORTNESS OF BREATH  *UNUSUAL BRUISING OR BLEEDING  TENDERNESS IN MOUTH AND THROAT WITH OR WITHOUT PRESENCE OF ULCERS  *URINARY PROBLEMS  *BOWEL PROBLEMS  UNUSUAL RASH Items with * indicate a potential emergency and should be followed up as soon as possible.  Feel free to call the clinic you have any questions or concerns. The clinic phone number is [phone].

## 2013-07-15 NOTE — Telephone Encounter (Signed)
gv pt appt schedule for march. message to MM re f/u 3/30. pt aware I will contact him re f/u.

## 2013-07-15 NOTE — Progress Notes (Signed)
Per Dr. Julien Nordmann, okay to tx with ANC-1.1

## 2013-07-16 ENCOUNTER — Ambulatory Visit
Admission: RE | Admit: 2013-07-16 | Discharge: 2013-07-16 | Disposition: A | Discharge status: Home / Self Care | Payer: Medicare Other | Source: Ambulatory Visit | Attending: Radiation Oncology | Admitting: Radiation Oncology

## 2013-07-16 ENCOUNTER — Telehealth: Payer: Self-pay | Admitting: Internal Medicine

## 2013-07-16 VITALS — BP 146/87 | HR 98 | Temp 98.0°F | Resp 18 | Wt 150.1 lb

## 2013-07-16 DIAGNOSIS — C3490 Malignant neoplasm of unspecified part of unspecified bronchus or lung: Secondary | ICD-10-CM

## 2013-07-16 MED ORDER — MORPHINE SULFATE 15 MG PO TABS: 15.0000 mg | ORAL_TABLET | ORAL | Status: DC | PRN

## 2013-07-16 NOTE — Telephone Encounter (Signed)
per staff response from MM - ok for pt to see AJ 3/30. s/w pt re appts for 3/23 and 3/30. pt will get new schedule when he comes in today for xrt.

## 2013-07-16 NOTE — Progress Notes (Signed)
Weekly Management Note Current Dose:40 Gy  Projected Dose:60 Gy   Narrative:  The patient presents for routine under treatment assessment.  CBCT/MVCT images/Port film x-rays were reviewed.  The chart was checked. Doing well. Some pain relieved with compazine bid. Hycet caused jitters and insomnia. Now drinking and eating well. Some minimal irritation on back.   Physical Findings:  Dry desquamation on back.   Vitals:  Filed Vitals:   07/16/13 1619  BP: 146/87  Pulse: 98  Temp: 98 F (36.7 C)  Resp: 18   Weight:  Wt Readings from Last 3 Encounters:  07/16/13 150 lb 1.6 oz (68.085 kg)  07/09/13 149 lb 4.8 oz (67.722 kg)  07/02/13 153 lb 12.8 oz (69.763 kg)   Lab Results  Component Value Date   WBC 2.6* 07/15/2013   HGB 10.3* 07/15/2013   HCT 31.8* 07/15/2013   MCV 103.2* 07/15/2013   PLT 112* 07/15/2013   Lab Results  Component Value Date   CREATININE 0.7 07/15/2013   BUN 9.1 07/15/2013   NA 141 07/15/2013   K 3.9 07/15/2013   CL 98 12/14/2012   CO2 23 07/15/2013     Impression:  The patient is tolerating radiation.  Plan:  Continue treatment as planned. Continue biafene. Limit compazine to bid. Gave script for morphine to use prn.

## 2013-07-16 NOTE — Progress Notes (Addendum)
Weekly assessment of radiation to right lung.completed 20 of 30 treatments.denies pain at present.States compazine helped pain better than hycet which kept him wired and unable to sleep.Wants something different for pain.Has radiation discoloration of posterior back.Continue application of biafine 2 to 3 time daily.Not using carafate , "it's not working".

## 2013-07-17 ENCOUNTER — Ambulatory Visit
Admission: RE | Admit: 2013-07-17 | Discharge: 2013-07-17 | Disposition: A | Discharge status: Home / Self Care | Payer: Medicare Other | Source: Ambulatory Visit | Attending: Radiation Oncology | Admitting: Radiation Oncology

## 2013-07-17 ENCOUNTER — Encounter: Payer: Self-pay | Admitting: Internal Medicine

## 2013-07-17 NOTE — Progress Notes (Signed)
Put wife's fmla form on nurse's desk.

## 2013-07-18 ENCOUNTER — Ambulatory Visit
Admission: RE | Admit: 2013-07-18 | Discharge: 2013-07-18 | Disposition: A | Discharge status: Home / Self Care | Payer: Medicare Other | Source: Ambulatory Visit | Attending: Radiation Oncology | Admitting: Radiation Oncology

## 2013-07-19 ENCOUNTER — Ambulatory Visit
Admission: RE | Admit: 2013-07-19 | Discharge: 2013-07-19 | Disposition: A | Discharge status: Home / Self Care | Payer: Medicare Other | Source: Ambulatory Visit | Attending: Radiation Oncology | Admitting: Radiation Oncology

## 2013-07-22 ENCOUNTER — Ambulatory Visit (HOSPITAL_BASED_OUTPATIENT_CLINIC_OR_DEPARTMENT_OTHER): Payer: Medicare Other

## 2013-07-22 ENCOUNTER — Other Ambulatory Visit (HOSPITAL_BASED_OUTPATIENT_CLINIC_OR_DEPARTMENT_OTHER): Payer: Medicare Other

## 2013-07-22 ENCOUNTER — Ambulatory Visit
Admission: RE | Admit: 2013-07-22 | Discharge: 2013-07-22 | Disposition: A | Discharge status: Home / Self Care | Payer: Medicare Other | Source: Ambulatory Visit | Attending: Radiation Oncology | Admitting: Radiation Oncology

## 2013-07-22 VITALS — BP 122/83 | HR 91 | Temp 97.7°F | Resp 20

## 2013-07-22 DIAGNOSIS — C3490 Malignant neoplasm of unspecified part of unspecified bronchus or lung: Secondary | ICD-10-CM

## 2013-07-22 DIAGNOSIS — Z5111 Encounter for antineoplastic chemotherapy: Secondary | ICD-10-CM

## 2013-07-22 DIAGNOSIS — C3430 Malignant neoplasm of lower lobe, unspecified bronchus or lung: Secondary | ICD-10-CM

## 2013-07-22 LAB — COMPREHENSIVE METABOLIC PANEL (CC13)
ALK PHOS: 237 U/L — AB (ref 40–150)
ALT: 41 U/L (ref 0–55)
ANION GAP: 9 meq/L (ref 3–11)
AST: 59 U/L — ABNORMAL HIGH (ref 5–34)
Albumin: 3.5 g/dL (ref 3.5–5.0)
BILIRUBIN TOTAL: 0.79 mg/dL (ref 0.20–1.20)
BUN: 10.1 mg/dL (ref 7.0–26.0)
CO2: 24 mEq/L (ref 22–29)
Calcium: 9.7 mg/dL (ref 8.4–10.4)
Chloride: 104 mEq/L (ref 98–109)
Creatinine: 0.8 mg/dL (ref 0.7–1.3)
Glucose: 120 mg/dl (ref 70–140)
Potassium: 4.3 mEq/L (ref 3.5–5.1)
Sodium: 136 mEq/L (ref 136–145)
Total Protein: 7.2 g/dL (ref 6.4–8.3)

## 2013-07-22 LAB — CBC WITH DIFFERENTIAL/PLATELET
BASO%: 0.3 % (ref 0.0–2.0)
Basophils Absolute: 0 10*3/uL (ref 0.0–0.1)
EOS%: 1.3 % (ref 0.0–7.0)
Eosinophils Absolute: 0 10*3/uL (ref 0.0–0.5)
HCT: 30.6 % — ABNORMAL LOW (ref 38.4–49.9)
HGB: 10.1 g/dL — ABNORMAL LOW (ref 13.0–17.1)
LYMPH%: 14.9 % (ref 14.0–49.0)
MCH: 33.7 pg — AB (ref 27.2–33.4)
MCHC: 33 g/dL (ref 32.0–36.0)
MCV: 102 fL — AB (ref 79.3–98.0)
MONO#: 0.7 10*3/uL (ref 0.1–0.9)
MONO%: 22 % — AB (ref 0.0–14.0)
NEUT#: 1.9 10*3/uL (ref 1.5–6.5)
NEUT%: 61.5 % (ref 39.0–75.0)
NRBC: 0 % (ref 0–0)
PLATELETS: 103 10*3/uL — AB (ref 140–400)
RBC: 3 10*6/uL — ABNORMAL LOW (ref 4.20–5.82)
RDW: 14.3 % (ref 11.0–14.6)
WBC: 3.1 10*3/uL — ABNORMAL LOW (ref 4.0–10.3)
lymph#: 0.5 10*3/uL — ABNORMAL LOW (ref 0.9–3.3)

## 2013-07-22 MED ORDER — FAMOTIDINE IN NACL 20-0.9 MG/50ML-% IV SOLN
INTRAVENOUS | Status: AC
  Filled 2013-07-22: qty 50

## 2013-07-22 MED ORDER — SODIUM CHLORIDE 0.9 % IV SOLN
Freq: Once | INTRAVENOUS | Status: AC
  Administered 2013-07-22: 14:00:00 via INTRAVENOUS

## 2013-07-22 MED ORDER — CARBOPLATIN CHEMO INTRADERMAL TEST DOSE 100MCG/0.02ML
100.0000 ug | Freq: Once | INTRADERMAL | Status: AC
  Administered 2013-07-22: 100 ug via INTRADERMAL
  Filled 2013-07-22: qty 0.01

## 2013-07-22 MED ORDER — PACLITAXEL CHEMO INJECTION 300 MG/50ML
45.0000 mg/m2 | Freq: Once | INTRAVENOUS | Status: AC
  Administered 2013-07-22: 78 mg via INTRAVENOUS
  Filled 2013-07-22: qty 13

## 2013-07-22 MED ORDER — FAMOTIDINE IN NACL 20-0.9 MG/50ML-% IV SOLN
20.0000 mg | Freq: Once | INTRAVENOUS | Status: AC
  Administered 2013-07-22: 20 mg via INTRAVENOUS

## 2013-07-22 MED ORDER — DEXAMETHASONE SODIUM PHOSPHATE 20 MG/5ML IJ SOLN
INTRAMUSCULAR | Status: AC
  Filled 2013-07-22: qty 5

## 2013-07-22 MED ORDER — DIPHENHYDRAMINE HCL 50 MG/ML IJ SOLN
50.0000 mg | Freq: Once | INTRAMUSCULAR | Status: AC
  Administered 2013-07-22: 50 mg via INTRAVENOUS

## 2013-07-22 MED ORDER — DEXAMETHASONE SODIUM PHOSPHATE 20 MG/5ML IJ SOLN
20.0000 mg | Freq: Once | INTRAMUSCULAR | Status: AC
  Administered 2013-07-22: 20 mg via INTRAVENOUS

## 2013-07-22 MED ORDER — ONDANSETRON 16 MG/50ML IVPB (CHCC)
16.0000 mg | Freq: Once | INTRAVENOUS | Status: AC
  Administered 2013-07-22: 16 mg via INTRAVENOUS

## 2013-07-22 MED ORDER — DIPHENHYDRAMINE HCL 50 MG/ML IJ SOLN
INTRAMUSCULAR | Status: AC
  Filled 2013-07-22: qty 1

## 2013-07-22 MED ORDER — SODIUM CHLORIDE 0.9 % IV SOLN
233.8000 mg | Freq: Once | INTRAVENOUS | Status: AC
  Administered 2013-07-22: 230 mg via INTRAVENOUS
  Filled 2013-07-22: qty 23

## 2013-07-22 NOTE — Patient Instructions (Signed)
[facility] Discharge Instructions for Patients Receiving Chemotherapy  Today you received the following chemotherapy agents: Taxol, Carboplatin  To help prevent nausea and vomiting after your treatment, we encourage you to take your nausea medication: Zofran 8 mg every 8 hrs as needed.    If you develop nausea and vomiting that is not controlled by your nausea medication, call the clinic.   BELOW ARE SYMPTOMS THAT SHOULD BE REPORTED IMMEDIATELY:  *FEVER GREATER THAN 100.5 F  *CHILLS WITH OR WITHOUT FEVER  NAUSEA AND VOMITING THAT IS NOT CONTROLLED WITH YOUR NAUSEA MEDICATION  *UNUSUAL SHORTNESS OF BREATH  *UNUSUAL BRUISING OR BLEEDING  TENDERNESS IN MOUTH AND THROAT WITH OR WITHOUT PRESENCE OF ULCERS  *URINARY PROBLEMS  *BOWEL PROBLEMS  UNUSUAL RASH Items with * indicate a potential emergency and should be followed up as soon as possible.  Feel free to call the clinic you have any questions or concerns. The clinic phone number is [phone].

## 2013-07-23 ENCOUNTER — Encounter: Payer: Self-pay | Admitting: Radiation Oncology

## 2013-07-23 ENCOUNTER — Ambulatory Visit
Admission: RE | Admit: 2013-07-23 | Discharge: 2013-07-23 | Disposition: A | Discharge status: Home / Self Care | Payer: Medicare Other | Source: Ambulatory Visit | Attending: Radiation Oncology | Admitting: Radiation Oncology

## 2013-07-23 VITALS — BP 129/84 | HR 97 | Resp 16 | Wt 147.6 lb

## 2013-07-23 DIAGNOSIS — C3490 Malignant neoplasm of unspecified part of unspecified bronchus or lung: Secondary | ICD-10-CM

## 2013-07-23 NOTE — Progress Notes (Signed)
Weekly Management Note Current Dose:52 Gy  Projected Dose:60 Gy   Narrative:  The patient presents for routine under treatment assessment.  CBCT/MVCT images/Port film x-rays were reviewed.  The chart was checked. Doing well. Some dysphagia. Taking morphine and carafate with good relief. Last RT tx on Monday (chemo scheduled)  Physical Findings:  Dark skin (dry) over back.   Vitals:  Filed Vitals:   07/23/13 1714  BP: 129/84  Pulse: 97  Resp: 16   Weight:  Wt Readings from Last 3 Encounters:  07/23/13 147 lb 9.6 oz (66.951 kg)  07/16/13 150 lb 1.6 oz (68.085 kg)  07/09/13 149 lb 4.8 oz (67.722 kg)   Lab Results  Component Value Date   WBC 3.1* 07/22/2013   HGB 10.1* 07/22/2013   HCT 30.6* 07/22/2013   MCV 102.0* 07/22/2013   PLT 103* 07/22/2013   Lab Results  Component Value Date   CREATININE 0.8 07/22/2013   BUN 10.1 07/22/2013   NA 136 07/22/2013   K 4.3 07/22/2013   CL 98 12/14/2012   CO2 24 07/22/2013     Impression:  The patient is tolerating radiation.  Plan:  Continue treatment as planned. Discussed increasing po intake and using morphine and carafate as needed. Will ask [facility] to cancel chemo for Monday. Follow up in 1 month.

## 2013-07-23 NOTE — Progress Notes (Signed)
Lost three pounds since last week. Drinking 2-3 Ensures per day in addition to frequent small meals. Reports difficulty swallowing continues but, isn't as bad as it was last week with the help of the carafate and morphine. Denies SOB. Reports productive cough with white sputum. Denies hemoptysis.

## 2013-07-24 ENCOUNTER — Ambulatory Visit
Admission: RE | Admit: 2013-07-24 | Discharge: 2013-07-24 | Disposition: A | Discharge status: Home / Self Care | Payer: Medicare Other | Source: Ambulatory Visit | Attending: Radiation Oncology | Admitting: Radiation Oncology

## 2013-07-25 ENCOUNTER — Ambulatory Visit
Admission: RE | Admit: 2013-07-25 | Discharge: 2013-07-25 | Disposition: A | Discharge status: Home / Self Care | Payer: Medicare Other | Source: Ambulatory Visit | Attending: Radiation Oncology | Admitting: Radiation Oncology

## 2013-07-26 ENCOUNTER — Other Ambulatory Visit: Payer: Self-pay | Admitting: Medical Oncology

## 2013-07-26 ENCOUNTER — Ambulatory Visit
Admission: RE | Admit: 2013-07-26 | Discharge: 2013-07-26 | Disposition: A | Discharge status: Home / Self Care | Payer: Medicare Other | Source: Ambulatory Visit | Attending: Radiation Oncology | Admitting: Radiation Oncology

## 2013-07-26 NOTE — Progress Notes (Signed)
Per val in radiation , Dr Pablo Ledger recommended cancelling pt chemo on 3/30 . Onc tx reqeust sent and message left for wife to keep appointments on Monday and that chemo will be cancelled.

## 2013-07-29 ENCOUNTER — Other Ambulatory Visit (HOSPITAL_BASED_OUTPATIENT_CLINIC_OR_DEPARTMENT_OTHER): Payer: Medicare Other

## 2013-07-29 ENCOUNTER — Encounter: Payer: Self-pay | Admitting: Physician Assistant

## 2013-07-29 ENCOUNTER — Ambulatory Visit (HOSPITAL_BASED_OUTPATIENT_CLINIC_OR_DEPARTMENT_OTHER): Payer: Medicare Other | Admitting: Physician Assistant

## 2013-07-29 ENCOUNTER — Telehealth: Payer: Self-pay | Admitting: Internal Medicine

## 2013-07-29 ENCOUNTER — Encounter: Payer: Self-pay | Admitting: Radiation Oncology

## 2013-07-29 ENCOUNTER — Ambulatory Visit: Payer: Medicare Other

## 2013-07-29 ENCOUNTER — Ambulatory Visit
Admission: RE | Admit: 2013-07-29 | Discharge: 2013-07-29 | Disposition: A | Discharge status: Home / Self Care | Payer: Medicare Other | Source: Ambulatory Visit | Attending: Radiation Oncology | Admitting: Radiation Oncology

## 2013-07-29 VITALS — BP 137/84 | HR 100 | Temp 98.4°F | Resp 20 | Ht 66.0 in | Wt 140.6 lb

## 2013-07-29 DIAGNOSIS — K208 Other esophagitis without bleeding: Secondary | ICD-10-CM

## 2013-07-29 DIAGNOSIS — R070 Pain in throat: Secondary | ICD-10-CM

## 2013-07-29 DIAGNOSIS — Y842 Radiological procedure and radiotherapy as the cause of abnormal reaction of the patient, or of later complication, without mention of misadventure at the time of the procedure: Secondary | ICD-10-CM

## 2013-07-29 DIAGNOSIS — C3490 Malignant neoplasm of unspecified part of unspecified bronchus or lung: Secondary | ICD-10-CM

## 2013-07-29 DIAGNOSIS — C3430 Malignant neoplasm of lower lobe, unspecified bronchus or lung: Secondary | ICD-10-CM

## 2013-07-29 LAB — CBC WITH DIFFERENTIAL/PLATELET
BASO%: 0.7 % (ref 0.0–2.0)
BASOS ABS: 0 10*3/uL (ref 0.0–0.1)
EOS%: 0.7 % (ref 0.0–7.0)
Eosinophils Absolute: 0 10*3/uL (ref 0.0–0.5)
HEMATOCRIT: 33.1 % — AB (ref 38.4–49.9)
HGB: 10.8 g/dL — ABNORMAL LOW (ref 13.0–17.1)
LYMPH%: 11.9 % — ABNORMAL LOW (ref 14.0–49.0)
MCH: 33.8 pg — AB (ref 27.2–33.4)
MCHC: 32.8 g/dL (ref 32.0–36.0)
MCV: 103 fL — ABNORMAL HIGH (ref 79.3–98.0)
MONO#: 0.5 10*3/uL (ref 0.1–0.9)
MONO%: 20.1 % — ABNORMAL HIGH (ref 0.0–14.0)
NEUT#: 1.7 10*3/uL (ref 1.5–6.5)
NEUT%: 66.6 % (ref 39.0–75.0)
PLATELETS: 140 10*3/uL (ref 140–400)
RBC: 3.21 10*6/uL — ABNORMAL LOW (ref 4.20–5.82)
RDW: 15.3 % — ABNORMAL HIGH (ref 11.0–14.6)
WBC: 2.6 10*3/uL — ABNORMAL LOW (ref 4.0–10.3)
lymph#: 0.3 10*3/uL — ABNORMAL LOW (ref 0.9–3.3)

## 2013-07-29 LAB — COMPREHENSIVE METABOLIC PANEL (CC13)
ALT: 35 U/L (ref 0–55)
AST: 58 U/L — ABNORMAL HIGH (ref 5–34)
Albumin: 3.3 g/dL — ABNORMAL LOW (ref 3.5–5.0)
Alkaline Phosphatase: 263 U/L — ABNORMAL HIGH (ref 40–150)
Anion Gap: 10 mEq/L (ref 3–11)
BUN: 11.5 mg/dL (ref 7.0–26.0)
CALCIUM: 9.6 mg/dL (ref 8.4–10.4)
CHLORIDE: 105 meq/L (ref 98–109)
CO2: 23 meq/L (ref 22–29)
CREATININE: 0.8 mg/dL (ref 0.7–1.3)
Glucose: 146 mg/dl — ABNORMAL HIGH (ref 70–140)
Potassium: 3.6 mEq/L (ref 3.5–5.1)
Sodium: 138 mEq/L (ref 136–145)
Total Bilirubin: 1.1 mg/dL (ref 0.20–1.20)
Total Protein: 7.2 g/dL (ref 6.4–8.3)

## 2013-07-29 NOTE — Telephone Encounter (Signed)
GV AND PRINTED APPT SCHED AND AVS FOR PT FOR mAY
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
## 2013-07-29 NOTE — Patient Instructions (Signed)
Followup with Dr. Julien Nordmann in 6 weeks with a restaging CT scan of your chest to reevaluate your disease

## 2013-07-30 NOTE — Progress Notes (Signed)
°  Radiation Oncology         [phone]) [phone] ________________________________  Name: Raymond Gregory MRN: [id number]  Date: 07/29/2013  DOB: 02/04/1951  End of Treatment Note  Diagnosis:   T3N2 NSCLC of the right upper lobe  Indication for treatment:  Curative       Radiation treatment dates:   06/17/13-07/29/13  Site/dose:   Right upper lobe, right lower lobe and mediastinal lymph nodes  Beams/energy:   IMRT with Helical Tomotherapy and 6 MV photons  Narrative: The patient tolerated radiation treatment relatively well.   He had minimal dysphagia and minimal skin changes. He was treated with concurrent chemotherapy.   Plan: The patient has completed radiation treatment. The patient will return to radiation oncology clinic for routine followup in one month. I advised them to call or return sooner if they have any questions or concerns related to their recovery or treatment.  ------------------------------------------------  Thea Silversmith, MD

## 2013-08-16 NOTE — Progress Notes (Signed)
Patient called stating that the morphine is making him sick.I have to take it even when I am not in pain as it makes me feel sick if I go too long without taking it."I want to get off this stuff.Told patient per Dr.Wentworth that he needs to take have a pill for 1 week and then not to take as frequently.To call me back on 08/19/13 to let me know how he is doing and we may schedule for follow up if needed.Knows to go to emergency department if he has bad reaction.

## 2013-08-16 NOTE — Addendum Note (Signed)
Encounter addended by: Thea Silversmith, MD on: 08/16/2013  9:38 AM<BR>     Documentation filed: Notes Section

## 2013-08-19 ENCOUNTER — Telehealth: Payer: Self-pay

## 2013-08-19 NOTE — Telephone Encounter (Signed)
Called patient to see how he did over the week-end with decreasing morphine dose.States he stopped taking morphine all together and feels much better.Has some periods of sweating.Will keep appointment for 08/29/13 but knows to call if needs to come in sooner.

## 2013-08-27 ENCOUNTER — Encounter: Payer: Self-pay | Admitting: Radiation Oncology

## 2013-08-29 ENCOUNTER — Ambulatory Visit
Admission: RE | Admit: 2013-08-29 | Discharge: 2013-08-29 | Disposition: A | Discharge status: Home / Self Care | Payer: Medicare Other | Source: Ambulatory Visit | Attending: Radiation Oncology | Admitting: Radiation Oncology

## 2013-08-29 VITALS — BP 128/93 | HR 93 | Temp 97.9°F | Wt 142.0 lb

## 2013-08-29 DIAGNOSIS — C3490 Malignant neoplasm of unspecified part of unspecified bronchus or lung: Secondary | ICD-10-CM

## 2013-08-29 HISTORY — DX: Personal history of irradiation: Z92.3

## 2013-08-29 NOTE — Progress Notes (Signed)
   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  He is feeling well. 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 He has imaging scheduled for next Friday and followup with Dr. Julien Nordmann next Monday. His weight is improving. He is happy with his energy levels. He did have some continued dry desquamation on his anterior and posterior chest. This is healing up well. He has occasional odynophagia just a single spot in his mid chest. He thought her he wants however.  Allergies:  Allergies  Allergen Reactions  . Morphine And Related Nausea And Vomiting    Medications:  Current Outpatient Prescriptions  Medication Sig Dispense Refill  . ibuprofen (ADVIL,MOTRIN) 200 MG tablet Take 200 mg by mouth every 6 (six) hours as needed.      . ondansetron (ZOFRAN) 8 MG tablet Take 1 tablet (8 mg total) by mouth every 8 (eight) hours as needed for nausea.  20 tablet  3  . prochlorperazine (COMPAZINE) 10 MG tablet Take 1 tablet (10 mg total) by mouth every 6 (six) hours as needed.  60 tablet  0  . sucralfate (CARAFATE) 1 GM/10ML suspension Take 10 mLs (1 g total) by mouth 4 (four) times daily -  with meals and at bedtime.  420 mL  0   No current facility-administered medications for this encounter.    Physical Exam:  Filed Vitals:   08/29/13 1538  BP: 128/93  Pulse: 93  Temp: 97.9 F (36.6 C)  Weight: 142 lb (64.411 kg)  SpO2: 100%   pleasant male in no distress sitting comfortably examining table. He appears healthy. He does have some hyperpigmentation and some hyperpigmentation of his back on the right side.  IMPRESSION: Raymond Gregory  is a 63 y.o. male status post definitive radiation for stage III non-small cell lung cancer  PLAN:  Followup with medical oncology. Followup with me when necessary. Sun protection in the treated area.    Thea Silversmith, MD

## 2013-08-29 NOTE — Progress Notes (Signed)
Patient completed radiation to right upper lobe on 07/29/13.Scheduled for ct of chest on 09/06/2013.Patient was having a sick feeling on morphine and decided to wean himself off.Appetite improved.Weight up almost 2 lbs from 07/29/13.Only takes ibuprofen for pain.Morphine added to medication intolerance.Denies pain or any other problems today.

## 2013-09-06 ENCOUNTER — Ambulatory Visit (HOSPITAL_COMMUNITY)
Admission: RE | Admit: 2013-09-06 | Discharge: 2013-09-06 | Disposition: A | Discharge status: Home / Self Care | Payer: Medicare Other | Source: Ambulatory Visit | Attending: Physician Assistant | Admitting: Physician Assistant

## 2013-09-06 ENCOUNTER — Other Ambulatory Visit: Payer: Medicare Other

## 2013-09-06 ENCOUNTER — Ambulatory Visit (HOSPITAL_COMMUNITY): Payer: Medicare Other

## 2013-09-06 DIAGNOSIS — J90 Pleural effusion, not elsewhere classified: Secondary | ICD-10-CM | POA: Insufficient documentation

## 2013-09-06 DIAGNOSIS — C3490 Malignant neoplasm of unspecified part of unspecified bronchus or lung: Secondary | ICD-10-CM | POA: Insufficient documentation

## 2013-09-06 DIAGNOSIS — R911 Solitary pulmonary nodule: Secondary | ICD-10-CM | POA: Insufficient documentation

## 2013-09-06 DIAGNOSIS — Z9221 Personal history of antineoplastic chemotherapy: Secondary | ICD-10-CM | POA: Insufficient documentation

## 2013-09-06 DIAGNOSIS — R599 Enlarged lymph nodes, unspecified: Secondary | ICD-10-CM | POA: Insufficient documentation

## 2013-09-06 DIAGNOSIS — K7689 Other specified diseases of liver: Secondary | ICD-10-CM | POA: Insufficient documentation

## 2013-09-06 LAB — POCT I-STAT CREATININE: Creatinine, Ser: 0.9 mg/dL (ref 0.50–1.35)

## 2013-09-06 MED ORDER — IOHEXOL 300 MG/ML  SOLN
80.0000 mL | Freq: Once | INTRAMUSCULAR | Status: AC | PRN
  Administered 2013-09-06: 80 mL via INTRAVENOUS

## 2013-09-09 ENCOUNTER — Ambulatory Visit (HOSPITAL_BASED_OUTPATIENT_CLINIC_OR_DEPARTMENT_OTHER): Payer: Medicare Other | Admitting: Internal Medicine

## 2013-09-09 ENCOUNTER — Encounter: Payer: Self-pay | Admitting: Internal Medicine

## 2013-09-09 VITALS — BP 120/74 | HR 76 | Temp 97.4°F | Resp 18 | Ht 66.0 in | Wt 141.6 lb

## 2013-09-09 DIAGNOSIS — C3490 Malignant neoplasm of unspecified part of unspecified bronchus or lung: Secondary | ICD-10-CM

## 2013-09-09 DIAGNOSIS — C3430 Malignant neoplasm of lower lobe, unspecified bronchus or lung: Secondary | ICD-10-CM
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
## 2013-09-11 ENCOUNTER — Telehealth: Payer: Self-pay | Admitting: Internal Medicine

## 2013-09-11 NOTE — Telephone Encounter (Signed)
S/W PT GAVE APPTS FOR LAB 8/10 @ 3PM AND MD 8/17 @ 3.15PM. ADVISED PT RADIOLOGY WILL CALL HIM DIRECTLY TO SCHEDULE CT SCAN IN AUG. PT VERBALIZED UNDERSTANDING.

## 2013-12-09 ENCOUNTER — Other Ambulatory Visit (HOSPITAL_BASED_OUTPATIENT_CLINIC_OR_DEPARTMENT_OTHER): Payer: Medicare Other

## 2013-12-09 ENCOUNTER — Ambulatory Visit (HOSPITAL_COMMUNITY)
Admission: RE | Admit: 2013-12-09 | Discharge: 2013-12-09 | Disposition: A | Discharge status: Home / Self Care | Payer: Medicare Other | Source: Ambulatory Visit | Attending: Internal Medicine | Admitting: Internal Medicine

## 2013-12-09 ENCOUNTER — Encounter (HOSPITAL_COMMUNITY): Payer: Self-pay

## 2013-12-09 DIAGNOSIS — E278 Other specified disorders of adrenal gland: Secondary | ICD-10-CM | POA: Insufficient documentation

## 2013-12-09 DIAGNOSIS — R918 Other nonspecific abnormal finding of lung field: Secondary | ICD-10-CM | POA: Insufficient documentation

## 2013-12-09 DIAGNOSIS — J9819 Other pulmonary collapse: Secondary | ICD-10-CM | POA: Insufficient documentation

## 2013-12-09 DIAGNOSIS — C3430 Malignant neoplasm of lower lobe, unspecified bronchus or lung: Secondary | ICD-10-CM

## 2013-12-09 DIAGNOSIS — R222 Localized swelling, mass and lump, trunk: Secondary | ICD-10-CM | POA: Insufficient documentation

## 2013-12-09 DIAGNOSIS — J90 Pleural effusion, not elsewhere classified: Secondary | ICD-10-CM | POA: Insufficient documentation

## 2013-12-09 DIAGNOSIS — R599 Enlarged lymph nodes, unspecified: Secondary | ICD-10-CM | POA: Insufficient documentation

## 2013-12-09 DIAGNOSIS — C3490 Malignant neoplasm of unspecified part of unspecified bronchus or lung: Secondary | ICD-10-CM

## 2013-12-09 LAB — COMPREHENSIVE METABOLIC PANEL
ALBUMIN: 3.5 g/dL (ref 3.5–5.2)
ALT: 25 U/L (ref 0–53)
AST: 33 U/L (ref 0–37)
Alkaline Phosphatase: 154 U/L — ABNORMAL HIGH (ref 39–117)
BUN: 11 mg/dL (ref 6–23)
CALCIUM: 9.6 mg/dL (ref 8.4–10.5)
CO2: 25 mEq/L (ref 19–32)
Chloride: 102 mEq/L (ref 96–112)
Creatinine, Ser: 0.7 mg/dL (ref 0.50–1.35)
Glucose, Bld: 89 mg/dL (ref 70–99)
Potassium: 4.3 mEq/L (ref 3.5–5.3)
Sodium: 139 mEq/L (ref 135–145)
Total Bilirubin: 0.4 mg/dL (ref 0.2–1.2)
Total Protein: 7.6 g/dL (ref 6.0–8.3)

## 2013-12-09 LAB — CBC WITH DIFFERENTIAL/PLATELET
BASO%: 1 % (ref 0.0–2.0)
Basophils Absolute: 0 10*3/uL (ref 0.0–0.1)
EOS%: 4 % (ref 0.0–7.0)
Eosinophils Absolute: 0.2 10*3/uL (ref 0.0–0.5)
HCT: 33.8 % — ABNORMAL LOW (ref 38.4–49.9)
HGB: 10.9 g/dL — ABNORMAL LOW (ref 13.0–17.1)
LYMPH%: 27.5 % (ref 14.0–49.0)
MCH: 30.7 pg (ref 27.2–33.4)
MCHC: 32.2 g/dL (ref 32.0–36.0)
MCV: 95.3 fL (ref 79.3–98.0)
MONO#: 0.6 10*3/uL (ref 0.1–0.9)
MONO%: 11.8 % (ref 0.0–14.0)
NEUT#: 2.6 10*3/uL (ref 1.5–6.5)
NEUT%: 55.7 % (ref 39.0–75.0)
Platelets: 247 10*3/uL (ref 140–400)
RBC: 3.55 10*6/uL — ABNORMAL LOW (ref 4.20–5.82)
RDW: 13.4 % (ref 11.0–14.6)
WBC: 4.7 10*3/uL (ref 4.0–10.3)
lymph#: 1.3 10*3/uL (ref 0.9–3.3)

## 2013-12-09 MED ORDER — IOHEXOL 300 MG/ML  SOLN
80.0000 mL | Freq: Once | INTRAMUSCULAR | Status: AC | PRN
  Administered 2013-12-09: 80 mL via INTRAVENOUS

## 2013-12-16 ENCOUNTER — Telehealth: Payer: Self-pay | Admitting: Internal Medicine

## 2013-12-16 ENCOUNTER — Ambulatory Visit (HOSPITAL_BASED_OUTPATIENT_CLINIC_OR_DEPARTMENT_OTHER): Payer: Medicare Other | Admitting: Internal Medicine

## 2013-12-16 ENCOUNTER — Encounter: Payer: Self-pay | Admitting: Internal Medicine

## 2013-12-16 VITALS — BP 124/80 | HR 80 | Temp 98.2°F | Resp 18 | Ht 66.0 in | Wt 143.4 lb

## 2013-12-16 DIAGNOSIS — C3410 Malignant neoplasm of upper lobe, unspecified bronchus or lung: Secondary | ICD-10-CM

## 2013-12-16 DIAGNOSIS — C3411 Malignant neoplasm of upper lobe, right bronchus or lung: Secondary | ICD-10-CM
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
## 2013-12-16 NOTE — Telephone Encounter (Signed)
gv and printed appt sched and avs for pt for Sept...gv pt barium

## 2013-12-25 ENCOUNTER — Ambulatory Visit (HOSPITAL_COMMUNITY)
Admission: RE | Admit: 2013-12-25 | Discharge: 2013-12-25 | Disposition: A | Discharge status: Home / Self Care | Payer: Medicare Other | Source: Ambulatory Visit | Attending: Internal Medicine | Admitting: Internal Medicine

## 2013-12-25 ENCOUNTER — Encounter (HOSPITAL_COMMUNITY): Payer: Self-pay

## 2013-12-25 DIAGNOSIS — R222 Localized swelling, mass and lump, trunk: Secondary | ICD-10-CM | POA: Diagnosis not present

## 2013-12-25 DIAGNOSIS — K7689 Other specified diseases of liver: Secondary | ICD-10-CM | POA: Insufficient documentation

## 2013-12-25 DIAGNOSIS — C3410 Malignant neoplasm of upper lobe, unspecified bronchus or lung: Secondary | ICD-10-CM | POA: Insufficient documentation

## 2013-12-25 DIAGNOSIS — C3411 Malignant neoplasm of upper lobe, right bronchus or lung: Secondary | ICD-10-CM

## 2013-12-25 DIAGNOSIS — I889 Nonspecific lymphadenitis, unspecified: Secondary | ICD-10-CM | POA: Diagnosis not present

## 2013-12-25 HISTORY — DX: Malignant (primary) neoplasm, unspecified: C80.1

## 2013-12-25 MED ORDER — IOHEXOL 300 MG/ML  SOLN
100.0000 mL | Freq: Once | INTRAMUSCULAR | Status: AC | PRN
  Administered 2013-12-25: 100 mL via INTRAVENOUS

## 2013-12-31 ENCOUNTER — Telehealth: Payer: Self-pay | Admitting: Internal Medicine

## 2013-12-31 ENCOUNTER — Encounter: Payer: Self-pay | Admitting: Internal Medicine

## 2013-12-31 ENCOUNTER — Ambulatory Visit (HOSPITAL_BASED_OUTPATIENT_CLINIC_OR_DEPARTMENT_OTHER): Payer: Medicare Other | Admitting: Internal Medicine

## 2013-12-31 VITALS — BP 133/72 | HR 76 | Temp 98.2°F | Resp 18 | Ht 66.0 in | Wt 143.9 lb

## 2013-12-31 DIAGNOSIS — C3490 Malignant neoplasm of unspecified part of unspecified bronchus or lung: Secondary | ICD-10-CM

## 2013-12-31 DIAGNOSIS — C3430 Malignant neoplasm of lower lobe, unspecified bronchus or lung: Secondary | ICD-10-CM

## 2013-12-31 DIAGNOSIS — C50919 Malignant neoplasm of unspecified site of unspecified female breast: Secondary | ICD-10-CM

## 2013-12-31 DIAGNOSIS — C779 Secondary and unspecified malignant neoplasm of lymph node, unspecified: Secondary | ICD-10-CM

## 2013-12-31 MED ORDER — DEXAMETHASONE 4 MG PO TABS: ORAL_TABLET | ORAL | Status: DC
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
## 2013-12-31 NOTE — Telephone Encounter (Signed)
[facility] pt appt schedule for sept/oct

## 2014-01-09 ENCOUNTER — Ambulatory Visit (HOSPITAL_BASED_OUTPATIENT_CLINIC_OR_DEPARTMENT_OTHER): Payer: Medicare Other

## 2014-01-09 ENCOUNTER — Other Ambulatory Visit (HOSPITAL_BASED_OUTPATIENT_CLINIC_OR_DEPARTMENT_OTHER): Payer: Medicare Other

## 2014-01-09 VITALS — BP 140/68 | HR 50 | Temp 98.0°F | Resp 18

## 2014-01-09 DIAGNOSIS — C3430 Malignant neoplasm of lower lobe, unspecified bronchus or lung: Secondary | ICD-10-CM

## 2014-01-09 DIAGNOSIS — C3490 Malignant neoplasm of unspecified part of unspecified bronchus or lung: Secondary | ICD-10-CM

## 2014-01-09 DIAGNOSIS — C3410 Malignant neoplasm of upper lobe, unspecified bronchus or lung: Secondary | ICD-10-CM

## 2014-01-09 DIAGNOSIS — Z5112 Encounter for antineoplastic immunotherapy: Secondary | ICD-10-CM

## 2014-01-09 DIAGNOSIS — Z5111 Encounter for antineoplastic chemotherapy: Secondary | ICD-10-CM

## 2014-01-09 LAB — CBC WITH DIFFERENTIAL/PLATELET
BASO%: 0.1 % (ref 0.0–2.0)
Basophils Absolute: 0 10*3/uL (ref 0.0–0.1)
EOS%: 0 % (ref 0.0–7.0)
Eosinophils Absolute: 0 10*3/uL (ref 0.0–0.5)
HCT: 31.9 % — ABNORMAL LOW (ref 38.4–49.9)
HGB: 10.2 g/dL — ABNORMAL LOW (ref 13.0–17.1)
LYMPH%: 6.4 % — AB (ref 14.0–49.0)
MCH: 29.9 pg (ref 27.2–33.4)
MCHC: 32.1 g/dL (ref 32.0–36.0)
MCV: 93.2 fL (ref 79.3–98.0)
MONO#: 0.2 10*3/uL (ref 0.1–0.9)
MONO%: 2.2 % (ref 0.0–14.0)
NEUT#: 8.5 10*3/uL — ABNORMAL HIGH (ref 1.5–6.5)
NEUT%: 91.3 % — AB (ref 39.0–75.0)
PLATELETS: 239 10*3/uL (ref 140–400)
RBC: 3.42 10*6/uL — ABNORMAL LOW (ref 4.20–5.82)
RDW: 13.6 % (ref 11.0–14.6)
WBC: 9.3 10*3/uL (ref 4.0–10.3)
lymph#: 0.6 10*3/uL — ABNORMAL LOW (ref 0.9–3.3)

## 2014-01-09 LAB — COMPREHENSIVE METABOLIC PANEL (CC13)
ALT: 19 U/L (ref 0–55)
ANION GAP: 9 meq/L (ref 3–11)
AST: 23 U/L (ref 5–34)
Albumin: 3.6 g/dL (ref 3.5–5.0)
Alkaline Phosphatase: 132 U/L (ref 40–150)
BILIRUBIN TOTAL: 0.42 mg/dL (ref 0.20–1.20)
BUN: 17.8 mg/dL (ref 7.0–26.0)
CALCIUM: 9.7 mg/dL (ref 8.4–10.4)
CO2: 22 mEq/L (ref 22–29)
Chloride: 105 mEq/L (ref 98–109)
Creatinine: 0.8 mg/dL (ref 0.7–1.3)
Glucose: 129 mg/dl (ref 70–140)
Potassium: 4.6 mEq/L (ref 3.5–5.1)
Sodium: 136 mEq/L (ref 136–145)
Total Protein: 7.6 g/dL (ref 6.4–8.3)

## 2014-01-09 LAB — UA PROTEIN, DIPSTICK - CHCC: Protein, ur: NEGATIVE mg/dL

## 2014-01-09 MED ORDER — DEXAMETHASONE SODIUM PHOSPHATE 10 MG/ML IJ SOLN
INTRAMUSCULAR | Status: AC
  Filled 2014-01-09: qty 1

## 2014-01-09 MED ORDER — DIPHENHYDRAMINE HCL 50 MG/ML IJ SOLN
INTRAMUSCULAR | Status: AC
  Filled 2014-01-09: qty 1

## 2014-01-09 MED ORDER — SODIUM CHLORIDE 0.9 % IV SOLN
Freq: Once | INTRAVENOUS | Status: AC
  Administered 2014-01-09: 14:00:00 via INTRAVENOUS

## 2014-01-09 MED ORDER — ONDANSETRON 8 MG/50ML IVPB (CHCC)
8.0000 mg | Freq: Once | INTRAVENOUS | Status: AC
  Administered 2014-01-09: 8 mg via INTRAVENOUS

## 2014-01-09 MED ORDER — RAMUCIRUMAB CHEMO INJECTION 500 MG/50ML
10.0000 mg/kg | Freq: Once | INTRAVENOUS | Status: AC
  Administered 2014-01-09: 700 mg via INTRAVENOUS
  Filled 2014-01-09: qty 70

## 2014-01-09 MED ORDER — ONDANSETRON 8 MG/NS 50 ML IVPB
INTRAVENOUS | Status: AC
  Filled 2014-01-09: qty 8

## 2014-01-09 MED ORDER — DOCETAXEL CHEMO INJECTION 160 MG/16ML
75.0000 mg/m2 | Freq: Once | INTRAVENOUS | Status: AC
  Administered 2014-01-09: 130 mg via INTRAVENOUS
  Filled 2014-01-09: qty 13

## 2014-01-09 MED ORDER — DEXAMETHASONE SODIUM PHOSPHATE 10 MG/ML IJ SOLN
10.0000 mg | Freq: Once | INTRAMUSCULAR | Status: AC
  Administered 2014-01-09: 10 mg via INTRAVENOUS

## 2014-01-09 MED ORDER — DIPHENHYDRAMINE HCL 50 MG/ML IJ SOLN
50.0000 mg | Freq: Once | INTRAMUSCULAR | Status: AC
  Administered 2014-01-09: 50 mg via INTRAVENOUS

## 2014-01-09 MED ORDER — ACETAMINOPHEN 325 MG PO TABS
ORAL_TABLET | ORAL | Status: AC
  Filled 2014-01-09: qty 2

## 2014-01-09 MED ORDER — ACETAMINOPHEN 325 MG PO TABS
650.0000 mg | ORAL_TABLET | Freq: Once | ORAL | Status: AC
  Administered 2014-01-09: 650 mg via ORAL

## 2014-01-09 NOTE — Patient Instructions (Signed)
[facility] Discharge Instructions for Patients Receiving Chemotherapy  Today you received the following chemotherapy agents cyramza and taxotere  To help prevent nausea and vomiting after your treatment, we encourage you to take your nausea medication compazine 10 mg by mouth about 8 pm tonight, may use your ondansetron about 10 pm if needed. Then continue as needed for nausea   If you develop nausea and vomiting that is not controlled by your nausea medication, call the clinic.   BELOW ARE SYMPTOMS THAT SHOULD BE REPORTED IMMEDIATELY:  *FEVER GREATER THAN 100.5 F  *CHILLS WITH OR WITHOUT FEVER  NAUSEA AND VOMITING THAT IS NOT CONTROLLED WITH YOUR NAUSEA MEDICATION  *UNUSUAL SHORTNESS OF BREATH  *UNUSUAL BRUISING OR BLEEDING  TENDERNESS IN MOUTH AND THROAT WITH OR WITHOUT PRESENCE OF ULCERS  *URINARY PROBLEMS  *BOWEL PROBLEMS  UNUSUAL RASH Items with * indicate a potential emergency and should be followed up as soon as possible.  Feel free to call the clinic you have any questions or concerns. The clinic phone number is [phone].

## 2014-01-10 ENCOUNTER — Telehealth: Payer: Self-pay | Admitting: *Deleted

## 2014-01-10 ENCOUNTER — Ambulatory Visit (HOSPITAL_BASED_OUTPATIENT_CLINIC_OR_DEPARTMENT_OTHER): Payer: Medicare Other

## 2014-01-10 VITALS — BP 121/73 | HR 65 | Temp 98.1°F

## 2014-01-10 DIAGNOSIS — Z23 Encounter for immunization: Secondary | ICD-10-CM

## 2014-01-10 DIAGNOSIS — C3430 Malignant neoplasm of lower lobe, unspecified bronchus or lung: Secondary | ICD-10-CM

## 2014-01-10 DIAGNOSIS — C3410 Malignant neoplasm of upper lobe, unspecified bronchus or lung: Secondary | ICD-10-CM

## 2014-01-10 DIAGNOSIS — Z5189 Encounter for other specified aftercare: Secondary | ICD-10-CM

## 2014-01-10 MED ORDER — INFLUENZA VAC SPLIT QUAD 0.5 ML IM SUSY
0.5000 mL | PREFILLED_SYRINGE | Freq: Once | INTRAMUSCULAR | Status: AC
  Administered 2014-01-10: 0.5 mL via INTRAMUSCULAR
  Filled 2014-01-10: qty 0.5

## 2014-01-10 MED ORDER — PEGFILGRASTIM INJECTION 6 MG/0.6ML
6.0000 mg | Freq: Once | SUBCUTANEOUS | Status: AC
  Administered 2014-01-10: 6 mg via SUBCUTANEOUS
  Filled 2014-01-10: qty 0.6

## 2014-01-10 NOTE — Telephone Encounter (Signed)
Raymond Gregory here for Neulasta injection following 1st cyramza/taxotere chemo treatment.  States that he is doing well.  No nausea, vomiting or diarrhea.  Is drinking his fluids and eating without difficulty.  All questions answered.  Knows to call if he has any problems or concerns.

## 2014-01-10 NOTE — Patient Instructions (Signed)
Pegfilgrastim injection What is this medicine? PEGFILGRASTIM (peg fil GRA stim) is a long-acting granulocyte colony-stimulating factor that stimulates the growth of neutrophils, a type of white blood cell important in the body's fight against infection. It is used to reduce the incidence of fever and infection in patients with certain types of cancer who are receiving chemotherapy that affects the bone marrow. This medicine may be used for other purposes; ask your health care provider or pharmacist if you have questions. COMMON BRAND NAME(S): Neulasta What should I tell my health care provider before I take this medicine? They need to know if you have any of these conditions: -latex allergy -ongoing radiation therapy -sickle cell disease -skin reactions to acrylic adhesives (On-Body Injector only) -an unusual or allergic reaction to pegfilgrastim, filgrastim, other medicines, foods, dyes, or preservatives -pregnant or trying to get pregnant -breast-feeding How should I use this medicine? This medicine is for injection under the skin. If you get this medicine at home, you will be taught how to prepare and give the pre-filled syringe or how to use the On-body Injector. Refer to the patient Instructions for Use for detailed instructions. Use exactly as directed. Take your medicine at regular intervals. Do not take your medicine more often than directed. It is important that you put your used needles and syringes in a special sharps container. Do not put them in a trash can. If you do not have a sharps container, call your pharmacist or healthcare provider to get one. Talk to your pediatrician regarding the use of this medicine in children. Special care may be needed. Overdosage: If you think you have taken too much of this medicine contact a poison control center or emergency room at once. NOTE: This medicine is only for you. Do not share this medicine with others. What if I miss a dose? It is  important not to miss your dose. Call your doctor or health care professional if you miss your dose. If you miss a dose due to an On-body Injector failure or leakage, a new dose should be administered as soon as possible using a single prefilled syringe for manual use. What may interact with this medicine? Interactions have not been studied. Give your health care provider a list of all the medicines, herbs, non-prescription drugs, or dietary supplements you use. Also tell them if you smoke, drink alcohol, or use illegal drugs. Some items may interact with your medicine. This list may not describe all possible interactions. Give your health care provider a list of all the medicines, herbs, non-prescription drugs, or dietary supplements you use. Also tell them if you smoke, drink alcohol, or use illegal drugs. Some items may interact with your medicine. What should I watch for while using this medicine? You may need blood work done while you are taking this medicine. If you are going to need a MRI, CT scan, or other procedure, tell your doctor that you are using this medicine (On-Body Injector only). What side effects may I notice from receiving this medicine? Side effects that you should report to your doctor or health care professional as soon as possible: -allergic reactions like skin rash, itching or hives, swelling of the face, lips, or tongue -dizziness -fever -pain, redness, or irritation at site where injected -pinpoint red spots on the skin -shortness of breath or breathing problems -stomach or side pain, or pain at the shoulder -swelling -tiredness -trouble passing urine Side effects that usually do not require medical attention (report to your doctor   or health care professional if they continue or are bothersome): -bone pain -muscle pain This list may not describe all possible side effects. Call your doctor for medical advice about side effects. You may report side effects to FDA at  1-800-FDA-1088. Where should I keep my medicine? Keep out of the reach of children. Store pre-filled syringes in a refrigerator between 2 and 8 degrees C (36 and 46 degrees F). Do not freeze. Keep in carton to protect from light. Throw away this medicine if it is left out of the refrigerator for more than 48 hours. Throw away any unused medicine after the expiration date. NOTE: This sheet is a summary. It may not cover all possible information. If you have questions about this medicine, talk to your doctor, pharmacist, or health care provider.  2015, [employer]. (2013-07-18 16:14:05)  

## 2014-01-16 ENCOUNTER — Other Ambulatory Visit (HOSPITAL_BASED_OUTPATIENT_CLINIC_OR_DEPARTMENT_OTHER): Payer: Medicare Other

## 2014-01-16 DIAGNOSIS — C3430 Malignant neoplasm of lower lobe, unspecified bronchus or lung: Secondary | ICD-10-CM

## 2014-01-16 DIAGNOSIS — C3490 Malignant neoplasm of unspecified part of unspecified bronchus or lung: Secondary | ICD-10-CM

## 2014-01-16 LAB — COMPREHENSIVE METABOLIC PANEL (CC13)
ALBUMIN: 3.3 g/dL — AB (ref 3.5–5.0)
ALT: 20 U/L (ref 0–55)
AST: 30 U/L (ref 5–34)
Alkaline Phosphatase: 139 U/L (ref 40–150)
Anion Gap: 8 mEq/L (ref 3–11)
BUN: 7.8 mg/dL (ref 7.0–26.0)
CALCIUM: 9.2 mg/dL (ref 8.4–10.4)
CHLORIDE: 104 meq/L (ref 98–109)
CO2: 26 mEq/L (ref 22–29)
Creatinine: 0.8 mg/dL (ref 0.7–1.3)
Glucose: 81 mg/dl (ref 70–140)
POTASSIUM: 4.4 meq/L (ref 3.5–5.1)
SODIUM: 138 meq/L (ref 136–145)
TOTAL PROTEIN: 6.9 g/dL (ref 6.4–8.3)
Total Bilirubin: 0.54 mg/dL (ref 0.20–1.20)

## 2014-01-16 LAB — CBC WITH DIFFERENTIAL/PLATELET
BASO%: 0.4 % (ref 0.0–2.0)
BASOS ABS: 0.1 10*3/uL (ref 0.0–0.1)
EOS ABS: 0.1 10*3/uL (ref 0.0–0.5)
EOS%: 0.4 % (ref 0.0–7.0)
HCT: 34.1 % — ABNORMAL LOW (ref 38.4–49.9)
HGB: 10.7 g/dL — ABNORMAL LOW (ref 13.0–17.1)
LYMPH#: 1.1 10*3/uL (ref 0.9–3.3)
LYMPH%: 8.6 % — ABNORMAL LOW (ref 14.0–49.0)
MCH: 29.3 pg (ref 27.2–33.4)
MCHC: 31.3 g/dL — ABNORMAL LOW (ref 32.0–36.0)
MCV: 93.6 fL (ref 79.3–98.0)
MONO#: 1.4 10*3/uL — ABNORMAL HIGH (ref 0.1–0.9)
MONO%: 11.1 % (ref 0.0–14.0)
NEUT%: 79.5 % — ABNORMAL HIGH (ref 39.0–75.0)
NEUTROS ABS: 10.2 10*3/uL — AB (ref 1.5–6.5)
Platelets: 171 10*3/uL (ref 140–400)
RBC: 3.64 10*6/uL — AB (ref 4.20–5.82)
RDW: 13.8 % (ref 11.0–14.6)
WBC: 12.8 10*3/uL — ABNORMAL HIGH (ref 4.0–10.3)

## 2014-01-23 ENCOUNTER — Other Ambulatory Visit (HOSPITAL_BASED_OUTPATIENT_CLINIC_OR_DEPARTMENT_OTHER): Payer: Medicare Other

## 2014-01-23 DIAGNOSIS — C3490 Malignant neoplasm of unspecified part of unspecified bronchus or lung: Secondary | ICD-10-CM

## 2014-01-23 DIAGNOSIS — C3430 Malignant neoplasm of lower lobe, unspecified bronchus or lung: Secondary | ICD-10-CM

## 2014-01-23 LAB — COMPREHENSIVE METABOLIC PANEL (CC13)
ALT: 22 U/L (ref 0–55)
AST: 31 U/L (ref 5–34)
Albumin: 3.2 g/dL — ABNORMAL LOW (ref 3.5–5.0)
Alkaline Phosphatase: 145 U/L (ref 40–150)
Anion Gap: 7 mEq/L (ref 3–11)
BILIRUBIN TOTAL: 0.45 mg/dL (ref 0.20–1.20)
BUN: 8.1 mg/dL (ref 7.0–26.0)
CO2: 26 mEq/L (ref 22–29)
Calcium: 9.3 mg/dL (ref 8.4–10.4)
Chloride: 107 mEq/L (ref 98–109)
Creatinine: 0.7 mg/dL (ref 0.7–1.3)
GLUCOSE: 92 mg/dL (ref 70–140)
Potassium: 4.4 mEq/L (ref 3.5–5.1)
SODIUM: 140 meq/L (ref 136–145)
Total Protein: 6.7 g/dL (ref 6.4–8.3)

## 2014-01-23 LAB — CBC WITH DIFFERENTIAL/PLATELET
BASO%: 0.6 % (ref 0.0–2.0)
Basophils Absolute: 0 10*3/uL (ref 0.0–0.1)
EOS%: 0.5 % (ref 0.0–7.0)
Eosinophils Absolute: 0 10*3/uL (ref 0.0–0.5)
HCT: 33.4 % — ABNORMAL LOW (ref 38.4–49.9)
HGB: 10.4 g/dL — ABNORMAL LOW (ref 13.0–17.1)
LYMPH%: 17.6 % (ref 14.0–49.0)
MCH: 29.7 pg (ref 27.2–33.4)
MCHC: 31.2 g/dL — AB (ref 32.0–36.0)
MCV: 95.1 fL (ref 79.3–98.0)
MONO#: 0.7 10*3/uL (ref 0.1–0.9)
MONO%: 9.3 % (ref 0.0–14.0)
NEUT#: 5.4 10*3/uL (ref 1.5–6.5)
NEUT%: 72 % (ref 39.0–75.0)
Platelets: 193 10*3/uL (ref 140–400)
RBC: 3.51 10*6/uL — AB (ref 4.20–5.82)
RDW: 14.6 % (ref 11.0–14.6)
WBC: 7.5 10*3/uL (ref 4.0–10.3)
lymph#: 1.3 10*3/uL (ref 0.9–3.3)

## 2014-01-30 ENCOUNTER — Telehealth: Payer: Self-pay | Admitting: Internal Medicine

## 2014-01-30 ENCOUNTER — Other Ambulatory Visit (HOSPITAL_BASED_OUTPATIENT_CLINIC_OR_DEPARTMENT_OTHER): Payer: Medicare Other

## 2014-01-30 ENCOUNTER — Ambulatory Visit (HOSPITAL_BASED_OUTPATIENT_CLINIC_OR_DEPARTMENT_OTHER): Payer: Medicare Other | Admitting: Internal Medicine

## 2014-01-30 ENCOUNTER — Ambulatory Visit (HOSPITAL_BASED_OUTPATIENT_CLINIC_OR_DEPARTMENT_OTHER): Payer: Medicare Other

## 2014-01-30 VITALS — BP 131/79 | HR 89 | Temp 98.3°F | Resp 20 | Ht 66.0 in | Wt 145.9 lb

## 2014-01-30 VITALS — BP 112/73 | HR 79

## 2014-01-30 DIAGNOSIS — C3410 Malignant neoplasm of upper lobe, unspecified bronchus or lung: Secondary | ICD-10-CM

## 2014-01-30 DIAGNOSIS — C778 Secondary and unspecified malignant neoplasm of lymph nodes of multiple regions: Secondary | ICD-10-CM

## 2014-01-30 DIAGNOSIS — C3431 Malignant neoplasm of lower lobe, right bronchus or lung: Secondary | ICD-10-CM

## 2014-01-30 DIAGNOSIS — C3490 Malignant neoplasm of unspecified part of unspecified bronchus or lung: Secondary | ICD-10-CM

## 2014-01-30 DIAGNOSIS — Z5111 Encounter for antineoplastic chemotherapy: Secondary | ICD-10-CM

## 2014-01-30 LAB — COMPREHENSIVE METABOLIC PANEL (CC13)
ALT: 27 U/L (ref 0–55)
ANION GAP: 8 meq/L (ref 3–11)
AST: 33 U/L (ref 5–34)
Albumin: 3.4 g/dL — ABNORMAL LOW (ref 3.5–5.0)
Alkaline Phosphatase: 127 U/L (ref 40–150)
BILIRUBIN TOTAL: 0.49 mg/dL (ref 0.20–1.20)
BUN: 14.4 mg/dL (ref 7.0–26.0)
CHLORIDE: 107 meq/L (ref 98–109)
CO2: 24 meq/L (ref 22–29)
CREATININE: 0.8 mg/dL (ref 0.7–1.3)
Calcium: 9.9 mg/dL (ref 8.4–10.4)
Glucose: 107 mg/dl (ref 70–140)
Potassium: 4.7 mEq/L (ref 3.5–5.1)
Sodium: 138 mEq/L (ref 136–145)
Total Protein: 7.1 g/dL (ref 6.4–8.3)

## 2014-01-30 LAB — CBC WITH DIFFERENTIAL/PLATELET
BASO%: 0.5 % (ref 0.0–2.0)
BASOS ABS: 0 10*3/uL (ref 0.0–0.1)
EOS%: 0 % (ref 0.0–7.0)
Eosinophils Absolute: 0 10*3/uL (ref 0.0–0.5)
HEMATOCRIT: 31.7 % — AB (ref 38.4–49.9)
HEMOGLOBIN: 10.3 g/dL — AB (ref 13.0–17.1)
LYMPH#: 1 10*3/uL (ref 0.9–3.3)
LYMPH%: 9.8 % — AB (ref 14.0–49.0)
MCH: 30.7 pg (ref 27.2–33.4)
MCHC: 32.4 g/dL (ref 32.0–36.0)
MCV: 94.7 fL (ref 79.3–98.0)
MONO#: 0.3 10*3/uL (ref 0.1–0.9)
MONO%: 2.8 % (ref 0.0–14.0)
NEUT#: 8.6 10*3/uL — ABNORMAL HIGH (ref 1.5–6.5)
NEUT%: 86.9 % — AB (ref 39.0–75.0)
Platelets: 242 10*3/uL (ref 140–400)
RBC: 3.35 10*6/uL — ABNORMAL LOW (ref 4.20–5.82)
RDW: 15.5 % — ABNORMAL HIGH (ref 11.0–14.6)
WBC: 10 10*3/uL (ref 4.0–10.3)

## 2014-01-30 LAB — UA PROTEIN, DIPSTICK - CHCC: PROTEIN: NEGATIVE mg/dL

## 2014-01-30 MED ORDER — DEXAMETHASONE SODIUM PHOSPHATE 10 MG/ML IJ SOLN
INTRAMUSCULAR | Status: AC
  Filled 2014-01-30: qty 1

## 2014-01-30 MED ORDER — ONDANSETRON 8 MG/50ML IVPB (CHCC)
8.0000 mg | Freq: Once | INTRAVENOUS | Status: AC
  Administered 2014-01-30: 8 mg via INTRAVENOUS

## 2014-01-30 MED ORDER — DEXAMETHASONE SODIUM PHOSPHATE 10 MG/ML IJ SOLN
10.0000 mg | Freq: Once | INTRAMUSCULAR | Status: AC
  Administered 2014-01-30: 10 mg via INTRAVENOUS

## 2014-01-30 MED ORDER — ACETAMINOPHEN 325 MG PO TABS
650.0000 mg | ORAL_TABLET | Freq: Once | ORAL | Status: AC
  Administered 2014-01-30: 650 mg via ORAL

## 2014-01-30 MED ORDER — ONDANSETRON 8 MG/NS 50 ML IVPB
INTRAVENOUS | Status: AC
  Filled 2014-01-30: qty 8

## 2014-01-30 MED ORDER — DOCETAXEL CHEMO INJECTION 160 MG/16ML
75.0000 mg/m2 | Freq: Once | INTRAVENOUS | Status: AC
  Administered 2014-01-30: 130 mg via INTRAVENOUS
  Filled 2014-01-30: qty 13

## 2014-01-30 MED ORDER — DIPHENHYDRAMINE HCL 50 MG/ML IJ SOLN
50.0000 mg | Freq: Once | INTRAMUSCULAR | Status: AC
  Administered 2014-01-30: 50 mg via INTRAVENOUS

## 2014-01-30 MED ORDER — RAMUCIRUMAB CHEMO INJECTION 500 MG/50ML
10.0000 mg/kg | Freq: Once | INTRAVENOUS | Status: AC
  Administered 2014-01-30: 700 mg via INTRAVENOUS
  Filled 2014-01-30: qty 70

## 2014-01-30 MED ORDER — SODIUM CHLORIDE 0.9 % IV SOLN
Freq: Once | INTRAVENOUS | Status: AC
  Administered 2014-01-30: 12:00:00 via INTRAVENOUS

## 2014-01-30 MED ORDER — DIPHENHYDRAMINE HCL 50 MG/ML IJ SOLN
INTRAMUSCULAR | Status: AC
  Filled 2014-01-30: qty 1

## 2014-01-30 MED ORDER — ACETAMINOPHEN 325 MG PO TABS
ORAL_TABLET | ORAL | Status: AC
  Filled 2014-01-30: qty 2

## 2014-01-30 NOTE — Telephone Encounter (Signed)
[facility] adn printed appt sched and avs for pt for OCT...sed added tx.

## 2014-01-30 NOTE — Patient Instructions (Signed)
[facility] Discharge Instructions for Patients Receiving Chemotherapy  Today you received the following chemotherapy agents Taxotere Cyramza  To help prevent nausea and vomiting after your treatment, we encourage you to take your nausea medication    Zofran and Compazine as directed   If you develop nausea and vomiting that is not controlled by your nausea medication, call the clinic.   BELOW ARE SYMPTOMS THAT SHOULD BE REPORTED IMMEDIATELY:  *FEVER GREATER THAN 100.5 F  *CHILLS WITH OR WITHOUT FEVER  NAUSEA AND VOMITING THAT IS NOT CONTROLLED WITH YOUR NAUSEA MEDICATION  *UNUSUAL SHORTNESS OF BREATH  *UNUSUAL BRUISING OR BLEEDING  TENDERNESS IN MOUTH AND THROAT WITH OR WITHOUT PRESENCE OF ULCERS  *URINARY PROBLEMS  *BOWEL PROBLEMS  UNUSUAL RASH Items with * indicate a potential emergency and should be followed up as soon as possible.  Feel free to call the clinic you have any questions or concerns. The clinic phone number is [phone].
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
## 2014-01-31 ENCOUNTER — Ambulatory Visit (HOSPITAL_BASED_OUTPATIENT_CLINIC_OR_DEPARTMENT_OTHER): Payer: Medicare Other

## 2014-01-31 VITALS — BP 123/72 | HR 82 | Temp 98.0°F

## 2014-01-31 DIAGNOSIS — C7989 Secondary malignant neoplasm of other specified sites: Secondary | ICD-10-CM

## 2014-01-31 DIAGNOSIS — C3410 Malignant neoplasm of upper lobe, unspecified bronchus or lung: Secondary | ICD-10-CM

## 2014-01-31 DIAGNOSIS — C3431 Malignant neoplasm of lower lobe, right bronchus or lung: Secondary | ICD-10-CM

## 2014-01-31 MED ORDER — PEGFILGRASTIM INJECTION 6 MG/0.6ML
6.0000 mg | Freq: Once | SUBCUTANEOUS | Status: AC
  Administered 2014-01-31: 6 mg via SUBCUTANEOUS
  Filled 2014-01-31: qty 0.6

## 2014-02-06 ENCOUNTER — Other Ambulatory Visit (HOSPITAL_BASED_OUTPATIENT_CLINIC_OR_DEPARTMENT_OTHER): Payer: Medicare Other

## 2014-02-06 DIAGNOSIS — C3431 Malignant neoplasm of lower lobe, right bronchus or lung: Secondary | ICD-10-CM

## 2014-02-06 DIAGNOSIS — C3490 Malignant neoplasm of unspecified part of unspecified bronchus or lung: Secondary | ICD-10-CM

## 2014-02-06 LAB — COMPREHENSIVE METABOLIC PANEL (CC13)
ALK PHOS: 128 U/L (ref 40–150)
ALT: 24 U/L (ref 0–55)
ANION GAP: 8 meq/L (ref 3–11)
AST: 38 U/L — ABNORMAL HIGH (ref 5–34)
Albumin: 3.3 g/dL — ABNORMAL LOW (ref 3.5–5.0)
BUN: 9.9 mg/dL (ref 7.0–26.0)
CO2: 24 meq/L (ref 22–29)
CREATININE: 0.7 mg/dL (ref 0.7–1.3)
Calcium: 9.3 mg/dL (ref 8.4–10.4)
Chloride: 107 mEq/L (ref 98–109)
Glucose: 84 mg/dl (ref 70–140)
Potassium: 4.6 mEq/L (ref 3.5–5.1)
Sodium: 139 mEq/L (ref 136–145)
Total Bilirubin: 0.59 mg/dL (ref 0.20–1.20)
Total Protein: 6.5 g/dL (ref 6.4–8.3)

## 2014-02-06 LAB — CBC WITH DIFFERENTIAL/PLATELET
BASO%: 0.9 % (ref 0.0–2.0)
Basophils Absolute: 0.1 10*3/uL (ref 0.0–0.1)
EOS%: 0.4 % (ref 0.0–7.0)
Eosinophils Absolute: 0 10*3/uL (ref 0.0–0.5)
HEMATOCRIT: 30.3 % — AB (ref 38.4–49.9)
HGB: 9.9 g/dL — ABNORMAL LOW (ref 13.0–17.1)
LYMPH%: 11.2 % — AB (ref 14.0–49.0)
MCH: 30.6 pg (ref 27.2–33.4)
MCHC: 32.7 g/dL (ref 32.0–36.0)
MCV: 93.5 fL (ref 79.3–98.0)
MONO#: 1.7 10*3/uL — AB (ref 0.1–0.9)
MONO%: 15.8 % — AB (ref 0.0–14.0)
NEUT#: 7.6 10*3/uL — ABNORMAL HIGH (ref 1.5–6.5)
NEUT%: 71.7 % (ref 39.0–75.0)
PLATELETS: 169 10*3/uL (ref 140–400)
RBC: 3.24 10*6/uL — AB (ref 4.20–5.82)
RDW: 15.2 % — ABNORMAL HIGH (ref 11.0–14.6)
WBC: 10.6 10*3/uL — ABNORMAL HIGH (ref 4.0–10.3)
lymph#: 1.2 10*3/uL (ref 0.9–3.3)

## 2014-02-13 ENCOUNTER — Other Ambulatory Visit (HOSPITAL_BASED_OUTPATIENT_CLINIC_OR_DEPARTMENT_OTHER): Payer: Medicare Other

## 2014-02-13 DIAGNOSIS — C3490 Malignant neoplasm of unspecified part of unspecified bronchus or lung: Secondary | ICD-10-CM

## 2014-02-13 DIAGNOSIS — C3431 Malignant neoplasm of lower lobe, right bronchus or lung: Secondary | ICD-10-CM

## 2014-02-13 LAB — CBC WITH DIFFERENTIAL/PLATELET
BASO%: 0.6 % (ref 0.0–2.0)
BASOS ABS: 0.1 10*3/uL (ref 0.0–0.1)
EOS%: 0.5 % (ref 0.0–7.0)
Eosinophils Absolute: 0.1 10*3/uL (ref 0.0–0.5)
HCT: 32.8 % — ABNORMAL LOW (ref 38.4–49.9)
HEMOGLOBIN: 10.5 g/dL — AB (ref 13.0–17.1)
LYMPH%: 11 % — AB (ref 14.0–49.0)
MCH: 30.7 pg (ref 27.2–33.4)
MCHC: 32 g/dL (ref 32.0–36.0)
MCV: 95.9 fL (ref 79.3–98.0)
MONO#: 0.7 10*3/uL (ref 0.1–0.9)
MONO%: 6.6 % (ref 0.0–14.0)
NEUT#: 8.4 10*3/uL — ABNORMAL HIGH (ref 1.5–6.5)
NEUT%: 81.3 % — AB (ref 39.0–75.0)
PLATELETS: 158 10*3/uL (ref 140–400)
RBC: 3.42 10*6/uL — ABNORMAL LOW (ref 4.20–5.82)
RDW: 17 % — ABNORMAL HIGH (ref 11.0–14.6)
WBC: 10.3 10*3/uL (ref 4.0–10.3)
lymph#: 1.1 10*3/uL (ref 0.9–3.3)

## 2014-02-13 LAB — COMPREHENSIVE METABOLIC PANEL (CC13)
ALBUMIN: 3.3 g/dL — AB (ref 3.5–5.0)
ALT: 30 U/L (ref 0–55)
AST: 39 U/L — ABNORMAL HIGH (ref 5–34)
Alkaline Phosphatase: 157 U/L — ABNORMAL HIGH (ref 40–150)
Anion Gap: 6 mEq/L (ref 3–11)
BUN: 11.9 mg/dL (ref 7.0–26.0)
CALCIUM: 9.2 mg/dL (ref 8.4–10.4)
CHLORIDE: 107 meq/L (ref 98–109)
CO2: 26 meq/L (ref 22–29)
Creatinine: 0.7 mg/dL (ref 0.7–1.3)
Glucose: 94 mg/dl (ref 70–140)
POTASSIUM: 4.2 meq/L (ref 3.5–5.1)
Sodium: 138 mEq/L (ref 136–145)
Total Bilirubin: 0.43 mg/dL (ref 0.20–1.20)
Total Protein: 6.5 g/dL (ref 6.4–8.3)

## 2014-02-20 ENCOUNTER — Ambulatory Visit (HOSPITAL_BASED_OUTPATIENT_CLINIC_OR_DEPARTMENT_OTHER): Payer: Medicare Other

## 2014-02-20 ENCOUNTER — Encounter: Payer: Self-pay | Admitting: Internal Medicine

## 2014-02-20 ENCOUNTER — Other Ambulatory Visit (HOSPITAL_BASED_OUTPATIENT_CLINIC_OR_DEPARTMENT_OTHER): Payer: Medicare Other

## 2014-02-20 ENCOUNTER — Ambulatory Visit (HOSPITAL_BASED_OUTPATIENT_CLINIC_OR_DEPARTMENT_OTHER): Payer: Medicare Other | Admitting: Internal Medicine

## 2014-02-20 VITALS — BP 140/75 | HR 86 | Temp 98.1°F | Resp 18 | Ht 66.0 in | Wt 143.6 lb

## 2014-02-20 DIAGNOSIS — C3490 Malignant neoplasm of unspecified part of unspecified bronchus or lung: Secondary | ICD-10-CM

## 2014-02-20 DIAGNOSIS — F17200 Nicotine dependence, unspecified, uncomplicated: Secondary | ICD-10-CM

## 2014-02-20 DIAGNOSIS — C3431 Malignant neoplasm of lower lobe, right bronchus or lung: Secondary | ICD-10-CM

## 2014-02-20 DIAGNOSIS — F32A Depression, unspecified: Secondary | ICD-10-CM

## 2014-02-20 DIAGNOSIS — C3410 Malignant neoplasm of upper lobe, unspecified bronchus or lung: Secondary | ICD-10-CM

## 2014-02-20 DIAGNOSIS — Z5111 Encounter for antineoplastic chemotherapy: Secondary | ICD-10-CM

## 2014-02-20 DIAGNOSIS — Z5112 Encounter for antineoplastic immunotherapy: Secondary | ICD-10-CM

## 2014-02-20 DIAGNOSIS — F329 Major depressive disorder, single episode, unspecified: Secondary | ICD-10-CM

## 2014-02-20 LAB — COMPREHENSIVE METABOLIC PANEL (CC13)
ALK PHOS: 115 U/L (ref 40–150)
ALT: 26 U/L (ref 0–55)
AST: 34 U/L (ref 5–34)
Albumin: 3.3 g/dL — ABNORMAL LOW (ref 3.5–5.0)
Anion Gap: 8 mEq/L (ref 3–11)
BUN: 18.5 mg/dL (ref 7.0–26.0)
CO2: 24 mEq/L (ref 22–29)
Calcium: 9.6 mg/dL (ref 8.4–10.4)
Chloride: 106 mEq/L (ref 98–109)
Creatinine: 0.8 mg/dL (ref 0.7–1.3)
Glucose: 81 mg/dl (ref 70–140)
POTASSIUM: 4.6 meq/L (ref 3.5–5.1)
SODIUM: 137 meq/L (ref 136–145)
TOTAL PROTEIN: 6.7 g/dL (ref 6.4–8.3)
Total Bilirubin: 0.44 mg/dL (ref 0.20–1.20)

## 2014-02-20 LAB — CBC WITH DIFFERENTIAL/PLATELET
BASO%: 0.4 % (ref 0.0–2.0)
Basophils Absolute: 0 10*3/uL (ref 0.0–0.1)
EOS%: 0.1 % (ref 0.0–7.0)
Eosinophils Absolute: 0 10*3/uL (ref 0.0–0.5)
HCT: 33 % — ABNORMAL LOW (ref 38.4–49.9)
HGB: 10.6 g/dL — ABNORMAL LOW (ref 13.0–17.1)
LYMPH%: 18.8 % (ref 14.0–49.0)
MCH: 30.9 pg (ref 27.2–33.4)
MCHC: 32.2 g/dL (ref 32.0–36.0)
MCV: 95.9 fL (ref 79.3–98.0)
MONO#: 1 10*3/uL — ABNORMAL HIGH (ref 0.1–0.9)
MONO%: 12.6 % (ref 0.0–14.0)
NEUT%: 68.1 % (ref 39.0–75.0)
NEUTROS ABS: 5.4 10*3/uL (ref 1.5–6.5)
Platelets: 199 10*3/uL (ref 140–400)
RBC: 3.45 10*6/uL — AB (ref 4.20–5.82)
RDW: 17.7 % — AB (ref 11.0–14.6)
WBC: 8 10*3/uL (ref 4.0–10.3)
lymph#: 1.5 10*3/uL (ref 0.9–3.3)

## 2014-02-20 MED ORDER — DEXAMETHASONE SODIUM PHOSPHATE 10 MG/ML IJ SOLN
10.0000 mg | Freq: Once | INTRAMUSCULAR | Status: AC
  Administered 2014-02-20: 10 mg via INTRAVENOUS

## 2014-02-20 MED ORDER — ONDANSETRON 8 MG/50ML IVPB (CHCC)
8.0000 mg | Freq: Once | INTRAVENOUS | Status: AC
  Administered 2014-02-20: 8 mg via INTRAVENOUS

## 2014-02-20 MED ORDER — SODIUM CHLORIDE 0.9 % IV SOLN
Freq: Once | INTRAVENOUS | Status: AC
  Administered 2014-02-20: 12:00:00 via INTRAVENOUS

## 2014-02-20 MED ORDER — DULOXETINE HCL 30 MG PO CPEP: 30.0000 mg | ORAL_CAPSULE | Freq: Every day | ORAL | Status: DC

## 2014-02-20 MED ORDER — DIPHENHYDRAMINE HCL 50 MG/ML IJ SOLN
INTRAMUSCULAR | Status: AC
  Filled 2014-02-20: qty 1

## 2014-02-20 MED ORDER — ACETAMINOPHEN 325 MG PO TABS
ORAL_TABLET | ORAL | Status: AC
  Filled 2014-02-20: qty 1

## 2014-02-20 MED ORDER — ONDANSETRON 8 MG/NS 50 ML IVPB
INTRAVENOUS | Status: AC
  Filled 2014-02-20: qty 8

## 2014-02-20 MED ORDER — DIPHENHYDRAMINE HCL 50 MG/ML IJ SOLN
50.0000 mg | Freq: Once | INTRAMUSCULAR | Status: AC
  Administered 2014-02-20: 50 mg via INTRAVENOUS

## 2014-02-20 MED ORDER — DEXAMETHASONE SODIUM PHOSPHATE 10 MG/ML IJ SOLN
INTRAMUSCULAR | Status: AC
  Filled 2014-02-20: qty 1

## 2014-02-20 MED ORDER — LIDOCAINE-PRILOCAINE 2.5-2.5 % EX CREA: 1.0000 "application " | TOPICAL_CREAM | CUTANEOUS | Status: AC | PRN

## 2014-02-20 MED ORDER — DEXTROSE 5 % IV SOLN
75.0000 mg/m2 | Freq: Once | INTRAVENOUS | Status: AC
  Administered 2014-02-20: 130 mg via INTRAVENOUS
  Filled 2014-02-20: qty 13

## 2014-02-20 MED ORDER — SODIUM CHLORIDE 0.9 % IV SOLN
10.0000 mg/kg | Freq: Once | INTRAVENOUS | Status: AC
  Administered 2014-02-20: 700 mg via INTRAVENOUS
  Filled 2014-02-20: qty 70

## 2014-02-20 MED ORDER — ACETAMINOPHEN 325 MG PO TABS
650.0000 mg | ORAL_TABLET | Freq: Once | ORAL | Status: AC
  Administered 2014-02-20: 650 mg via ORAL

## 2014-02-20 NOTE — Patient Instructions (Signed)
[facility] Discharge Instructions for Patients Receiving Chemotherapy  Today you received the following chemotherapy agents Taxotere Cyramza  To help prevent nausea and vomiting after your treatment, we encourage you to take your nausea medication    Zofran and Compazine as directed   If you develop nausea and vomiting that is not controlled by your nausea medication, call the clinic.   BELOW ARE SYMPTOMS THAT SHOULD BE REPORTED IMMEDIATELY:  *FEVER GREATER THAN 100.5 F  *CHILLS WITH OR WITHOUT FEVER  NAUSEA AND VOMITING THAT IS NOT CONTROLLED WITH YOUR NAUSEA MEDICATION  *UNUSUAL SHORTNESS OF BREATH  *UNUSUAL BRUISING OR BLEEDING  TENDERNESS IN MOUTH AND THROAT WITH OR WITHOUT PRESENCE OF ULCERS  *URINARY PROBLEMS  *BOWEL PROBLEMS  UNUSUAL RASH Items with * indicate a potential emergency and should be followed up as soon as possible.  Feel free to call the clinic you have any questions or concerns. The clinic phone number is [phone].
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
## 2014-02-21 ENCOUNTER — Ambulatory Visit (HOSPITAL_BASED_OUTPATIENT_CLINIC_OR_DEPARTMENT_OTHER): Payer: Medicare Other

## 2014-02-21 ENCOUNTER — Telehealth: Payer: Self-pay | Admitting: *Deleted

## 2014-02-21 VITALS — BP 132/80 | HR 85 | Temp 97.7°F

## 2014-02-21 DIAGNOSIS — C3431 Malignant neoplasm of lower lobe, right bronchus or lung: Secondary | ICD-10-CM

## 2014-02-21 DIAGNOSIS — C3410 Malignant neoplasm of upper lobe, unspecified bronchus or lung: Secondary | ICD-10-CM

## 2014-02-21 DIAGNOSIS — Z5189 Encounter for other specified aftercare: Secondary | ICD-10-CM

## 2014-02-21 MED ORDER — PEGFILGRASTIM INJECTION 6 MG/0.6ML
6.0000 mg | Freq: Once | SUBCUTANEOUS | Status: AC
  Administered 2014-02-21: 6 mg via SUBCUTANEOUS
  Filled 2014-02-21: qty 0.6

## 2014-02-21 NOTE — Patient Instructions (Signed)
Pegfilgrastim injection What is this medicine? PEGFILGRASTIM (peg fil GRA stim) is a long-acting granulocyte colony-stimulating factor that stimulates the growth of neutrophils, a type of white blood cell important in the body's fight against infection. It is used to reduce the incidence of fever and infection in patients with certain types of cancer who are receiving chemotherapy that affects the bone marrow. This medicine may be used for other purposes; ask your health care provider or pharmacist if you have questions. COMMON BRAND NAME(S): Neulasta What should I tell my health care provider before I take this medicine? They need to know if you have any of these conditions: -latex allergy -ongoing radiation therapy -sickle cell disease -skin reactions to acrylic adhesives (On-Body Injector only) -an unusual or allergic reaction to pegfilgrastim, filgrastim, other medicines, foods, dyes, or preservatives -pregnant or trying to get pregnant -breast-feeding How should I use this medicine? This medicine is for injection under the skin. If you get this medicine at home, you will be taught how to prepare and give the pre-filled syringe or how to use the On-body Injector. Refer to the patient Instructions for Use for detailed instructions. Use exactly as directed. Take your medicine at regular intervals. Do not take your medicine more often than directed. It is important that you put your used needles and syringes in a special sharps container. Do not put them in a trash can. If you do not have a sharps container, call your pharmacist or healthcare provider to get one. Talk to your pediatrician regarding the use of this medicine in children. Special care may be needed. Overdosage: If you think you have taken too much of this medicine contact a poison control center or emergency room at once. NOTE: This medicine is only for you. Do not share this medicine with others. What if I miss a dose? It is  important not to miss your dose. Call your doctor or health care professional if you miss your dose. If you miss a dose due to an On-body Injector failure or leakage, a new dose should be administered as soon as possible using a single prefilled syringe for manual use. What may interact with this medicine? Interactions have not been studied. Give your health care provider a list of all the medicines, herbs, non-prescription drugs, or dietary supplements you use. Also tell them if you smoke, drink alcohol, or use illegal drugs. Some items may interact with your medicine. This list may not describe all possible interactions. Give your health care provider a list of all the medicines, herbs, non-prescription drugs, or dietary supplements you use. Also tell them if you smoke, drink alcohol, or use illegal drugs. Some items may interact with your medicine. What should I watch for while using this medicine? You may need blood work done while you are taking this medicine. If you are going to need a MRI, CT scan, or other procedure, tell your doctor that you are using this medicine (On-Body Injector only). What side effects may I notice from receiving this medicine? Side effects that you should report to your doctor or health care professional as soon as possible: -allergic reactions like skin rash, itching or hives, swelling of the face, lips, or tongue -dizziness -fever -pain, redness, or irritation at site where injected -pinpoint red spots on the skin -shortness of breath or breathing problems -stomach or side pain, or pain at the shoulder -swelling -tiredness -trouble passing urine Side effects that usually do not require medical attention (report to your doctor   or health care professional if they continue or are bothersome): -bone pain -muscle pain This list may not describe all possible side effects. Call your doctor for medical advice about side effects. You may report side effects to FDA at  1-800-FDA-1088. Where should I keep my medicine? Keep out of the reach of children. Store pre-filled syringes in a refrigerator between 2 and 8 degrees C (36 and 46 degrees F). Do not freeze. Keep in carton to protect from light. Throw away this medicine if it is left out of the refrigerator for more than 48 hours. Throw away any unused medicine after the expiration date. NOTE: This sheet is a summary. It may not cover all possible information. If you have questions about this medicine, talk to your doctor, pharmacist, or health care provider.  2015, [employer]. (2013-07-18 16:14:05)  

## 2014-02-21 NOTE — Telephone Encounter (Signed)
Per staff message and POF I have scheduled appts. Advised scheduler of appts. JMW  

## 2014-02-24 ENCOUNTER — Telehealth: Payer: Self-pay | Admitting: Medical Oncology

## 2014-02-24 DIAGNOSIS — R110 Nausea: Secondary | ICD-10-CM

## 2014-02-24 MED ORDER — ONDANSETRON HCL 8 MG PO TABS: 8.0000 mg | ORAL_TABLET | Freq: Three times a day (TID) | ORAL | Status: DC | PRN

## 2014-02-24 NOTE — Telephone Encounter (Signed)
Ondansetron refill

## 2014-02-26 ENCOUNTER — Telehealth: Payer: Self-pay | Admitting: Internal Medicine

## 2014-02-26 NOTE — Telephone Encounter (Signed)
pt called to r/s time of lab...done per pt request...pt ok and aware

## 2014-02-27 ENCOUNTER — Other Ambulatory Visit (HOSPITAL_BASED_OUTPATIENT_CLINIC_OR_DEPARTMENT_OTHER): Payer: Medicare Other

## 2014-02-27 ENCOUNTER — Telehealth: Payer: Self-pay | Admitting: Internal Medicine

## 2014-02-27 ENCOUNTER — Other Ambulatory Visit: Payer: Medicare Other

## 2014-02-27 DIAGNOSIS — C3431 Malignant neoplasm of lower lobe, right bronchus or lung: Secondary | ICD-10-CM

## 2014-02-27 DIAGNOSIS — C3490 Malignant neoplasm of unspecified part of unspecified bronchus or lung: Secondary | ICD-10-CM

## 2014-02-27 LAB — COMPREHENSIVE METABOLIC PANEL (CC13)
ALT: 21 U/L (ref 0–55)
ANION GAP: 6 meq/L (ref 3–11)
AST: 32 U/L (ref 5–34)
Albumin: 3.3 g/dL — ABNORMAL LOW (ref 3.5–5.0)
Alkaline Phosphatase: 133 U/L (ref 40–150)
BUN: 7 mg/dL (ref 7.0–26.0)
CALCIUM: 9.4 mg/dL (ref 8.4–10.4)
CHLORIDE: 103 meq/L (ref 98–109)
CO2: 26 meq/L (ref 22–29)
CREATININE: 0.7 mg/dL (ref 0.7–1.3)
GLUCOSE: 94 mg/dL (ref 70–140)
Potassium: 4.1 mEq/L (ref 3.5–5.1)
Sodium: 135 mEq/L — ABNORMAL LOW (ref 136–145)
Total Bilirubin: 0.94 mg/dL (ref 0.20–1.20)
Total Protein: 6.4 g/dL (ref 6.4–8.3)

## 2014-02-27 LAB — CBC WITH DIFFERENTIAL/PLATELET
BASO%: 0.7 % (ref 0.0–2.0)
BASOS ABS: 0.1 10*3/uL (ref 0.0–0.1)
EOS%: 0.4 % (ref 0.0–7.0)
Eosinophils Absolute: 0 10*3/uL (ref 0.0–0.5)
HEMATOCRIT: 34 % — AB (ref 38.4–49.9)
HGB: 10.9 g/dL — ABNORMAL LOW (ref 13.0–17.1)
LYMPH%: 17.7 % (ref 14.0–49.0)
MCH: 30.5 pg (ref 27.2–33.4)
MCHC: 32.1 g/dL (ref 32.0–36.0)
MCV: 95.2 fL (ref 79.3–98.0)
MONO#: 0.9 10*3/uL (ref 0.1–0.9)
MONO%: 12.3 % (ref 0.0–14.0)
NEUT#: 4.9 10*3/uL (ref 1.5–6.5)
NEUT%: 68.9 % (ref 39.0–75.0)
PLATELETS: 137 10*3/uL — AB (ref 140–400)
RBC: 3.57 10*6/uL — ABNORMAL LOW (ref 4.20–5.82)
RDW: 16 % — ABNORMAL HIGH (ref 11.0–14.6)
WBC: 7.2 10*3/uL (ref 4.0–10.3)
lymph#: 1.3 10*3/uL (ref 0.9–3.3)

## 2014-02-27 LAB — UA PROTEIN, DIPSTICK - CHCC: Protein, ur: <30 mg/dL

## 2014-02-27 NOTE — Telephone Encounter (Signed)
Per 10/22 POF added injection to sch and gave pt updated sch..... KJ

## 2014-03-03 ENCOUNTER — Ambulatory Visit (HOSPITAL_COMMUNITY): Payer: Medicare Other

## 2014-03-03 ENCOUNTER — Other Ambulatory Visit: Payer: Self-pay | Admitting: Radiology

## 2014-03-03 ENCOUNTER — Ambulatory Visit: Payer: Medicare Other

## 2014-03-04 ENCOUNTER — Other Ambulatory Visit: Payer: Self-pay | Admitting: Radiology

## 2014-03-05 ENCOUNTER — Other Ambulatory Visit: Payer: Self-pay | Admitting: Radiology

## 2014-03-06 ENCOUNTER — Ambulatory Visit (HOSPITAL_COMMUNITY)
Admission: RE | Admit: 2014-03-06 | Discharge: 2014-03-06 | Disposition: A | Discharge status: Home / Self Care | Payer: Medicare Other | Source: Ambulatory Visit | Attending: Internal Medicine | Admitting: Internal Medicine

## 2014-03-06 ENCOUNTER — Other Ambulatory Visit: Payer: Self-pay | Admitting: Internal Medicine

## 2014-03-06 ENCOUNTER — Encounter (HOSPITAL_COMMUNITY): Payer: Self-pay

## 2014-03-06 ENCOUNTER — Other Ambulatory Visit: Payer: Medicare Other

## 2014-03-06 DIAGNOSIS — Z79899 Other long term (current) drug therapy: Secondary | ICD-10-CM | POA: Diagnosis not present

## 2014-03-06 DIAGNOSIS — F32A Depression, unspecified: Secondary | ICD-10-CM

## 2014-03-06 DIAGNOSIS — Z452 Encounter for adjustment and management of vascular access device: Secondary | ICD-10-CM | POA: Insufficient documentation

## 2014-03-06 DIAGNOSIS — Z7952 Long term (current) use of systemic steroids: Secondary | ICD-10-CM | POA: Diagnosis not present

## 2014-03-06 DIAGNOSIS — K219 Gastro-esophageal reflux disease without esophagitis: Secondary | ICD-10-CM | POA: Diagnosis not present

## 2014-03-06 DIAGNOSIS — C3431 Malignant neoplasm of lower lobe, right bronchus or lung: Secondary | ICD-10-CM | POA: Insufficient documentation

## 2014-03-06 DIAGNOSIS — R200 Anesthesia of skin: Secondary | ICD-10-CM | POA: Insufficient documentation

## 2014-03-06 DIAGNOSIS — Z923 Personal history of irradiation: Secondary | ICD-10-CM | POA: Insufficient documentation

## 2014-03-06 DIAGNOSIS — F17200 Nicotine dependence, unspecified, uncomplicated: Secondary | ICD-10-CM

## 2014-03-06 DIAGNOSIS — F329 Major depressive disorder, single episode, unspecified: Secondary | ICD-10-CM

## 2014-03-06 DIAGNOSIS — Z87891 Personal history of nicotine dependence: Secondary | ICD-10-CM | POA: Diagnosis not present

## 2014-03-06 DIAGNOSIS — J45909 Unspecified asthma, uncomplicated: Secondary | ICD-10-CM | POA: Insufficient documentation

## 2014-03-06 LAB — COMPREHENSIVE METABOLIC PANEL
ALK PHOS: 127 U/L — AB (ref 39–117)
ALT: 18 U/L (ref 0–53)
AST: 35 U/L (ref 0–37)
Albumin: 2.8 g/dL — ABNORMAL LOW (ref 3.5–5.2)
Anion gap: 12 (ref 5–15)
BILIRUBIN TOTAL: 0.6 mg/dL (ref 0.3–1.2)
BUN: 8 mg/dL (ref 6–23)
CHLORIDE: 99 meq/L (ref 96–112)
CO2: 22 meq/L (ref 19–32)
Calcium: 9 mg/dL (ref 8.4–10.5)
Creatinine, Ser: 0.64 mg/dL (ref 0.50–1.35)
GFR calc Af Amer: >90 mL/min (ref >90)
GFR calc non Af Amer: >90 mL/min (ref >90)
GLUCOSE: 108 mg/dL — AB (ref 70–99)
POTASSIUM: 3.9 meq/L (ref 3.7–5.3)
SODIUM: 133 meq/L — AB (ref 137–147)
Total Protein: 6.5 g/dL (ref 6.0–8.3)

## 2014-03-06 LAB — CBC WITH DIFFERENTIAL/PLATELET
Basophils Absolute: 0 10*3/uL (ref 0.0–0.1)
Basophils Relative: 0 % (ref 0–1)
Eosinophils Absolute: 0.1 10*3/uL (ref 0.0–0.7)
Eosinophils Relative: 1 % (ref 0–5)
HCT: 28.8 % — ABNORMAL LOW (ref 39.0–52.0)
HEMOGLOBIN: 9.4 g/dL — AB (ref 13.0–17.0)
LYMPHS ABS: 1.2 10*3/uL (ref 0.7–4.0)
LYMPHS PCT: 13 % (ref 12–46)
MCH: 30.3 pg (ref 26.0–34.0)
MCHC: 32.6 g/dL (ref 30.0–36.0)
MCV: 92.9 fL (ref 78.0–100.0)
MONOS PCT: 11 % (ref 3–12)
Monocytes Absolute: 1 10*3/uL (ref 0.1–1.0)
NEUTROS PCT: 75 % (ref 43–77)
Neutro Abs: 6.8 10*3/uL (ref 1.7–7.7)
Platelets: 206 10*3/uL (ref 150–400)
RBC: 3.1 MIL/uL — AB (ref 4.22–5.81)
RDW: 16 % — ABNORMAL HIGH (ref 11.5–15.5)
WBC: 9 10*3/uL (ref 4.0–10.5)

## 2014-03-06 LAB — PROTIME-INR
INR: 1.01 (ref 0.00–1.49)
Prothrombin Time: 13.4 seconds (ref 11.6–15.2)

## 2014-03-06 LAB — APTT: aPTT: 33 seconds (ref 24–37)

## 2014-03-06 MED ORDER — SODIUM CHLORIDE 0.9 % IR SOLN
Status: AC | PRN
  Administered 2014-03-06: 16:00:00

## 2014-03-06 MED ORDER — CEFAZOLIN SODIUM-DEXTROSE 2-3 GM-% IV SOLR
2.0000 g | Freq: Once | INTRAVENOUS | Status: AC
  Administered 2014-03-06: 2 g via INTRAVENOUS

## 2014-03-06 MED ORDER — SODIUM CHLORIDE 0.9 % IV SOLN
Freq: Once | INTRAVENOUS | Status: AC
  Administered 2014-03-06: 14:00:00 via INTRAVENOUS
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
## 2014-03-06 NOTE — Discharge Instructions (Signed)
Implanted Port Insertion, Care After Refer to this sheet in the next few weeks. These instructions provide you with information on caring for yourself after your procedure. Your health care provider may also give you more specific instructions. Your treatment has been planned according to current medical practices, but problems sometimes occur. Call your health care provider if you have any problems or questions after your procedure. WHAT TO EXPECT AFTER THE PROCEDURE After your procedure, it is typical to have the following:   Discomfort at the port insertion site. Ice packs to the area will help.  Bruising on the skin over the port. This will subside in 3-4 days. HOME CARE INSTRUCTIONS  After your port is placed, you will get a manufacturer's information card. The card has information about your port. Keep this card with you at all times.   Know what kind of port you have. There are many types of ports available.   Wear a medical alert bracelet in case of an emergency. This can help alert health care workers that you have a port.   The port can stay in for as long as your health care provider believes it is necessary.   A home health care nurse may give medicines and take care of the port.   You or a family member can get special training and directions for giving medicine and taking care of the port at home.  SEEK MEDICAL CARE IF:   Your port does not flush or you are unable to get a blood return.   You have a fever or chills. SEEK IMMEDIATE MEDICAL CARE IF:  You have new fluid or pus coming from your incision.   You notice a bad smell coming from your incision site.   You have swelling, pain, or more redness at the incision or port site.   You have chest pain or shortness of breath. Document Released: 02/06/2013 Document Revised: 04/23/2013 Document Reviewed: 02/06/2013 [facility] Patient Information 2015 [place], [place]. This information is not intended to replace  advice given to you by your health care provider. Make sure you discuss any questions you have with your health care provider. Conscious Sedation, Adult, Care After Refer to this sheet in the next few weeks. These instructions provide you with information on caring for yourself after your procedure. Your health care provider may also give you more specific instructions. Your treatment has been planned according to current medical practices, but problems sometimes occur. Call your health care provider if you have any problems or questions after your procedure. WHAT TO EXPECT AFTER THE PROCEDURE  After your procedure:  You may feel sleepy, clumsy, and have poor balance for several hours.  Vomiting may occur if you eat too soon after the procedure. HOME CARE INSTRUCTIONS  Do not participate in any activities where you could become injured for at least 24 hours. Do not:  Drive.  Swim.  Ride a bicycle.  Operate heavy machinery.  Cook.  Use power tools.  Climb ladders.  Work from a high place.  Do not make important decisions or sign legal documents until you are improved.  If you vomit, drink water, juice, or soup when you can drink without vomiting. Make sure you have little or no nausea before eating solid foods.  Only take over-the-counter or prescription medicines for pain, discomfort, or fever as directed by your health care provider.  Make sure you and your family fully understand everything about the medicines given to you, including what side effects  may occur.  You should not drink alcohol, take sleeping pills, or take medicines that cause drowsiness for at least 24 hours.  If you smoke, do not smoke without supervision.  If you are feeling better, you may resume normal activities 24 hours after you were sedated.  Keep all appointments with your health care provider. SEEK MEDICAL CARE IF:  Your skin is pale or bluish in color.  You continue to feel nauseous or  vomit.  Your pain is getting worse and is not helped by medicine.  You have bleeding or swelling.  You are still sleepy or feeling clumsy after 24 hours. SEEK IMMEDIATE MEDICAL CARE IF:  You develop a rash.  You have difficulty breathing.  You develop any type of allergic problem.  You have a fever. MAKE SURE YOU:  Understand these instructions.  Will watch your condition.  Will get help right away if you are not doing well or get worse. Document Released: 02/06/2013 Document Reviewed: 02/06/2013 [facility] Patient Information 2015 [place], [place]. This information is not intended to replace advice given to you by your health care provider. Make sure you discuss any questions you have with your health care provider.

## 2014-03-06 NOTE — Procedures (Signed)
Procedure:  Porta-cath placement Access:  Right IJ vein Findings:  SL Power Port placed with catheter tip at cavoatrial junction.  OK to use. No PTX.

## 2014-03-12 ENCOUNTER — Ambulatory Visit (HOSPITAL_COMMUNITY)
Admission: RE | Admit: 2014-03-12 | Discharge: 2014-03-12 | Disposition: A | Discharge status: Home / Self Care | Payer: Medicare Other | Source: Ambulatory Visit | Attending: Internal Medicine | Admitting: Internal Medicine

## 2014-03-12 DIAGNOSIS — J90 Pleural effusion, not elsewhere classified: Secondary | ICD-10-CM | POA: Insufficient documentation

## 2014-03-12 DIAGNOSIS — K769 Liver disease, unspecified: Secondary | ICD-10-CM | POA: Insufficient documentation

## 2014-03-12 DIAGNOSIS — C3490 Malignant neoplasm of unspecified part of unspecified bronchus or lung: Secondary | ICD-10-CM | POA: Diagnosis present

## 2014-03-12 DIAGNOSIS — F32A Depression, unspecified: Secondary | ICD-10-CM

## 2014-03-12 DIAGNOSIS — F329 Major depressive disorder, single episode, unspecified: Secondary | ICD-10-CM

## 2014-03-12 DIAGNOSIS — R918 Other nonspecific abnormal finding of lung field: Secondary | ICD-10-CM | POA: Insufficient documentation

## 2014-03-12 DIAGNOSIS — R1904 Left lower quadrant abdominal swelling, mass and lump: Secondary | ICD-10-CM | POA: Diagnosis not present

## 2014-03-12 DIAGNOSIS — C3431 Malignant neoplasm of lower lobe, right bronchus or lung: Secondary | ICD-10-CM

## 2014-03-12 DIAGNOSIS — F17200 Nicotine dependence, unspecified, uncomplicated: Secondary | ICD-10-CM

## 2014-03-12 MED ORDER — IOHEXOL 300 MG/ML  SOLN
100.0000 mL | Freq: Once | INTRAMUSCULAR | Status: AC | PRN
  Administered 2014-03-12: 100 mL via INTRAVENOUS

## 2014-03-13 ENCOUNTER — Telehealth: Payer: Self-pay | Admitting: Physician Assistant

## 2014-03-13 ENCOUNTER — Encounter: Payer: Self-pay | Admitting: Physician Assistant

## 2014-03-13 ENCOUNTER — Ambulatory Visit (HOSPITAL_BASED_OUTPATIENT_CLINIC_OR_DEPARTMENT_OTHER): Payer: Medicare Other

## 2014-03-13 ENCOUNTER — Other Ambulatory Visit: Payer: Medicare Other

## 2014-03-13 ENCOUNTER — Other Ambulatory Visit (HOSPITAL_BASED_OUTPATIENT_CLINIC_OR_DEPARTMENT_OTHER): Payer: Medicare Other

## 2014-03-13 ENCOUNTER — Ambulatory Visit (HOSPITAL_BASED_OUTPATIENT_CLINIC_OR_DEPARTMENT_OTHER): Payer: Medicare Other | Admitting: Physician Assistant

## 2014-03-13 VITALS — BP 134/91 | HR 87 | Temp 97.5°F | Resp 18 | Ht 66.0 in | Wt 138.0 lb

## 2014-03-13 DIAGNOSIS — C3431 Malignant neoplasm of lower lobe, right bronchus or lung: Secondary | ICD-10-CM

## 2014-03-13 DIAGNOSIS — F17200 Nicotine dependence, unspecified, uncomplicated: Secondary | ICD-10-CM

## 2014-03-13 DIAGNOSIS — C3490 Malignant neoplasm of unspecified part of unspecified bronchus or lung: Secondary | ICD-10-CM

## 2014-03-13 DIAGNOSIS — Z5111 Encounter for antineoplastic chemotherapy: Secondary | ICD-10-CM

## 2014-03-13 DIAGNOSIS — G629 Polyneuropathy, unspecified: Secondary | ICD-10-CM

## 2014-03-13 DIAGNOSIS — F32A Depression, unspecified: Secondary | ICD-10-CM

## 2014-03-13 DIAGNOSIS — F329 Major depressive disorder, single episode, unspecified: Secondary | ICD-10-CM

## 2014-03-13 LAB — CBC WITH DIFFERENTIAL/PLATELET
BASO%: 0.2 % (ref 0.0–2.0)
BASOS ABS: 0 10*3/uL (ref 0.0–0.1)
EOS%: 0 % (ref 0.0–7.0)
Eosinophils Absolute: 0 10*3/uL (ref 0.0–0.5)
HEMATOCRIT: 33.4 % — AB (ref 38.4–49.9)
HGB: 10.5 g/dL — ABNORMAL LOW (ref 13.0–17.1)
LYMPH#: 0.8 10*3/uL — AB (ref 0.9–3.3)
LYMPH%: 13.2 % — ABNORMAL LOW (ref 14.0–49.0)
MCH: 30 pg (ref 27.2–33.4)
MCHC: 31.4 g/dL — ABNORMAL LOW (ref 32.0–36.0)
MCV: 95.7 fL (ref 79.3–98.0)
MONO#: 0.1 10*3/uL (ref 0.1–0.9)
MONO%: 1.1 % (ref 0.0–14.0)
NEUT#: 5.3 10*3/uL (ref 1.5–6.5)
NEUT%: 85.5 % — AB (ref 39.0–75.0)
PLATELETS: 291 10*3/uL (ref 140–400)
RBC: 3.49 10*6/uL — ABNORMAL LOW (ref 4.20–5.82)
RDW: 17.6 % — ABNORMAL HIGH (ref 11.0–14.6)
WBC: 6.2 10*3/uL (ref 4.0–10.3)

## 2014-03-13 LAB — COMPREHENSIVE METABOLIC PANEL (CC13)
ALT: 14 U/L (ref 0–55)
ANION GAP: 9 meq/L (ref 3–11)
AST: 26 U/L (ref 5–34)
Albumin: 3 g/dL — ABNORMAL LOW (ref 3.5–5.0)
Alkaline Phosphatase: 107 U/L (ref 40–150)
BUN: 11.4 mg/dL (ref 7.0–26.0)
CO2: 23 mEq/L (ref 22–29)
CREATININE: 0.8 mg/dL (ref 0.7–1.3)
Calcium: 9.7 mg/dL (ref 8.4–10.4)
Chloride: 105 mEq/L (ref 98–109)
Glucose: 207 mg/dl — ABNORMAL HIGH (ref 70–140)
Potassium: 4.9 mEq/L (ref 3.5–5.1)
Sodium: 136 mEq/L (ref 136–145)
Total Bilirubin: 0.59 mg/dL (ref 0.20–1.20)
Total Protein: 7 g/dL (ref 6.4–8.3)

## 2014-03-13 MED ORDER — SODIUM CHLORIDE 0.9 % IJ SOLN
10.0000 mL | INTRAMUSCULAR | Status: DC | PRN
  Administered 2014-03-13: 10 mL
  Filled 2014-03-13: qty 10

## 2014-03-13 MED ORDER — ACETAMINOPHEN 325 MG PO TABS
ORAL_TABLET | ORAL | Status: AC
  Filled 2014-03-13: qty 2

## 2014-03-13 MED ORDER — DIPHENHYDRAMINE HCL 50 MG/ML IJ SOLN
50.0000 mg | Freq: Once | INTRAMUSCULAR | Status: AC
  Administered 2014-03-13: 50 mg via INTRAVENOUS

## 2014-03-13 MED ORDER — RAMUCIRUMAB CHEMO INJECTION 500 MG/50ML
10.0000 mg/kg | Freq: Once | INTRAVENOUS | Status: AC
  Administered 2014-03-13: 700 mg via INTRAVENOUS
  Filled 2014-03-13: qty 70

## 2014-03-13 MED ORDER — ONDANSETRON 8 MG/50ML IVPB (CHCC): 8.0000 mg | Freq: Once | INTRAVENOUS | Status: DC

## 2014-03-13 MED ORDER — ACETAMINOPHEN 325 MG PO TABS
650.0000 mg | ORAL_TABLET | Freq: Once | ORAL | Status: AC
  Administered 2014-03-13: 650 mg via ORAL

## 2014-03-13 MED ORDER — DEXAMETHASONE SODIUM PHOSPHATE 10 MG/ML IJ SOLN: 10.0000 mg | Freq: Once | INTRAMUSCULAR | Status: DC

## 2014-03-13 MED ORDER — HEPARIN SOD (PORK) LOCK FLUSH 100 UNIT/ML IV SOLN
500.0000 [IU] | Freq: Once | INTRAVENOUS | Status: AC | PRN
  Administered 2014-03-13: 500 [IU]
  Filled 2014-03-13: qty 5

## 2014-03-13 MED ORDER — SODIUM CHLORIDE 0.9 % IV SOLN
Freq: Once | INTRAVENOUS | Status: AC
  Administered 2014-03-13: 13:00:00 via INTRAVENOUS

## 2014-03-13 MED ORDER — DIPHENHYDRAMINE HCL 50 MG/ML IJ SOLN
INTRAMUSCULAR | Status: AC
  Filled 2014-03-13: qty 1
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
## 2014-03-13 NOTE — Progress Notes (Signed)
Per Burnetta Sabin, PA, patient to receive cyramza only today, no taxotere. Next day injection appointment to be cancelled. Per pharmacy, patient only needs to be premedicated with tylenol and benadryl, may omit ondansetron and decadron due to no taxotere.

## 2014-03-13 NOTE — Patient Instructions (Addendum)
[facility] Discharge Instructions for Patients Receiving Chemotherapy  Today you received the following chemotherapy agent: Cyramza   To help prevent nausea and vomiting after your treatment, we encourage you to take your nausea medication as prescribed.    If you develop nausea and vomiting that is not controlled by your nausea medication, call the clinic.   BELOW ARE SYMPTOMS THAT SHOULD BE REPORTED IMMEDIATELY:  *FEVER GREATER THAN 100.5 F  *CHILLS WITH OR WITHOUT FEVER  NAUSEA AND VOMITING THAT IS NOT CONTROLLED WITH YOUR NAUSEA MEDICATION  *UNUSUAL SHORTNESS OF BREATH  *UNUSUAL BRUISING OR BLEEDING  TENDERNESS IN MOUTH AND THROAT WITH OR WITHOUT PRESENCE OF ULCERS  *URINARY PROBLEMS  *BOWEL PROBLEMS  UNUSUAL RASH Items with * indicate a potential emergency and should be followed up as soon as possible.  Feel free to call the clinic you have any questions or concerns. The clinic phone number is [phone].

## 2014-03-13 NOTE — Telephone Encounter (Signed)
Gave avs & cal Nov/Dec.

## 2014-03-14 ENCOUNTER — Ambulatory Visit: Payer: Medicare Other

## 2014-03-16 NOTE — Patient Instructions (Signed)
Your restaging CT scan showed interval improvement in your disease Continue weekly labs as scheduled Follow-up in 3 weeks prior to your next scheduled cycle of chemotherapy

## 2014-03-20 ENCOUNTER — Other Ambulatory Visit (HOSPITAL_BASED_OUTPATIENT_CLINIC_OR_DEPARTMENT_OTHER): Payer: Medicare Other

## 2014-03-20 ENCOUNTER — Other Ambulatory Visit: Payer: Medicare Other

## 2014-03-20 ENCOUNTER — Other Ambulatory Visit: Payer: Self-pay | Admitting: Medical Oncology

## 2014-03-20 DIAGNOSIS — C3431 Malignant neoplasm of lower lobe, right bronchus or lung: Secondary | ICD-10-CM

## 2014-03-20 DIAGNOSIS — C3490 Malignant neoplasm of unspecified part of unspecified bronchus or lung: Secondary | ICD-10-CM

## 2014-03-20 LAB — COMPREHENSIVE METABOLIC PANEL (CC13)
ALT: 17 U/L (ref 0–55)
AST: 27 U/L (ref 5–34)
Albumin: 3 g/dL — ABNORMAL LOW (ref 3.5–5.0)
Alkaline Phosphatase: 112 U/L (ref 40–150)
Anion Gap: 7 mEq/L (ref 3–11)
BUN: 11.5 mg/dL (ref 7.0–26.0)
CALCIUM: 9.4 mg/dL (ref 8.4–10.4)
CHLORIDE: 103 meq/L (ref 98–109)
CO2: 26 meq/L (ref 22–29)
CREATININE: 0.8 mg/dL (ref 0.7–1.3)
GLUCOSE: 105 mg/dL (ref 70–140)
Potassium: 4.5 mEq/L (ref 3.5–5.1)
Sodium: 135 mEq/L — ABNORMAL LOW (ref 136–145)
TOTAL PROTEIN: 6.6 g/dL (ref 6.4–8.3)
Total Bilirubin: 0.51 mg/dL (ref 0.20–1.20)

## 2014-03-20 LAB — CBC WITH DIFFERENTIAL/PLATELET
BASO%: 1.1 % (ref 0.0–2.0)
Basophils Absolute: 0.1 10*3/uL (ref 0.0–0.1)
EOS%: 10.2 % — ABNORMAL HIGH (ref 0.0–7.0)
Eosinophils Absolute: 0.7 10*3/uL — ABNORMAL HIGH (ref 0.0–0.5)
HCT: 32 % — ABNORMAL LOW (ref 38.4–49.9)
HEMOGLOBIN: 10 g/dL — AB (ref 13.0–17.1)
LYMPH#: 1.1 10*3/uL (ref 0.9–3.3)
LYMPH%: 17.7 % (ref 14.0–49.0)
MCH: 30.2 pg (ref 27.2–33.4)
MCHC: 31.3 g/dL — ABNORMAL LOW (ref 32.0–36.0)
MCV: 96.7 fL (ref 79.3–98.0)
MONO#: 0.6 10*3/uL (ref 0.1–0.9)
MONO%: 9.7 % (ref 0.0–14.0)
NEUT%: 61.3 % (ref 39.0–75.0)
NEUTROS ABS: 3.9 10*3/uL (ref 1.5–6.5)
PLATELETS: 256 10*3/uL (ref 140–400)
RBC: 3.31 10*6/uL — ABNORMAL LOW (ref 4.20–5.82)
RDW: 17.3 % — ABNORMAL HIGH (ref 11.0–14.6)
WBC: 6.4 10*3/uL (ref 4.0–10.3)

## 2014-03-20 NOTE — Progress Notes (Signed)
Lab called and asking if pt needs urine for protein. I cancelled urine protein for today -pt not getting chemo today .

## 2014-03-26 ENCOUNTER — Other Ambulatory Visit (HOSPITAL_BASED_OUTPATIENT_CLINIC_OR_DEPARTMENT_OTHER): Payer: Medicare Other

## 2014-03-26 DIAGNOSIS — C3431 Malignant neoplasm of lower lobe, right bronchus or lung: Secondary | ICD-10-CM

## 2014-03-26 DIAGNOSIS — C3490 Malignant neoplasm of unspecified part of unspecified bronchus or lung: Secondary | ICD-10-CM

## 2014-03-26 LAB — CBC WITH DIFFERENTIAL/PLATELET
BASO%: 1.3 % (ref 0.0–2.0)
Basophils Absolute: 0.1 10*3/uL (ref 0.0–0.1)
EOS%: 5.3 % (ref 0.0–7.0)
Eosinophils Absolute: 0.3 10*3/uL (ref 0.0–0.5)
HEMATOCRIT: 31.1 % — AB (ref 38.4–49.9)
HEMOGLOBIN: 9.6 g/dL — AB (ref 13.0–17.1)
LYMPH#: 1 10*3/uL (ref 0.9–3.3)
LYMPH%: 16.3 % (ref 14.0–49.0)
MCH: 29.7 pg (ref 27.2–33.4)
MCHC: 30.7 g/dL — AB (ref 32.0–36.0)
MCV: 96.6 fL (ref 79.3–98.0)
MONO#: 0.7 10*3/uL (ref 0.1–0.9)
MONO%: 10.9 % (ref 0.0–14.0)
NEUT#: 4.1 10*3/uL (ref 1.5–6.5)
NEUT%: 66.2 % (ref 39.0–75.0)
Platelets: 257 10*3/uL (ref 140–400)
RBC: 3.22 10*6/uL — ABNORMAL LOW (ref 4.20–5.82)
RDW: 17.8 % — AB (ref 11.0–14.6)
WBC: 6.1 10*3/uL (ref 4.0–10.3)

## 2014-03-26 LAB — COMPREHENSIVE METABOLIC PANEL (CC13)
ALBUMIN: 3.1 g/dL — AB (ref 3.5–5.0)
ALT: 17 U/L (ref 0–55)
AST: 28 U/L (ref 5–34)
Alkaline Phosphatase: 111 U/L (ref 40–150)
Anion Gap: 8 mEq/L (ref 3–11)
BUN: 12.4 mg/dL (ref 7.0–26.0)
CALCIUM: 9.3 mg/dL (ref 8.4–10.4)
CHLORIDE: 103 meq/L (ref 98–109)
CO2: 25 meq/L (ref 22–29)
Creatinine: 0.7 mg/dL (ref 0.7–1.3)
Glucose: 94 mg/dl (ref 70–140)
POTASSIUM: 4.4 meq/L (ref 3.5–5.1)
Sodium: 136 mEq/L (ref 136–145)
Total Bilirubin: 0.53 mg/dL (ref 0.20–1.20)
Total Protein: 6.5 g/dL (ref 6.4–8.3)

## 2014-03-28 ENCOUNTER — Telehealth: Payer: Self-pay | Admitting: Internal Medicine

## 2014-03-28 NOTE — Telephone Encounter (Signed)
due to early closing appts for 12/24 moved to earlier time. also added 12/24 inf and 12/26 inj. s/w pt he is aware and will get new schedule 12/23.

## 2014-04-03 ENCOUNTER — Ambulatory Visit (HOSPITAL_BASED_OUTPATIENT_CLINIC_OR_DEPARTMENT_OTHER): Payer: Medicare Other

## 2014-04-03 ENCOUNTER — Telehealth: Payer: Self-pay | Admitting: *Deleted

## 2014-04-03 ENCOUNTER — Other Ambulatory Visit: Payer: Medicare Other

## 2014-04-03 ENCOUNTER — Telehealth: Payer: Self-pay | Admitting: Internal Medicine

## 2014-04-03 ENCOUNTER — Encounter: Payer: Self-pay | Admitting: Internal Medicine

## 2014-04-03 ENCOUNTER — Ambulatory Visit (HOSPITAL_BASED_OUTPATIENT_CLINIC_OR_DEPARTMENT_OTHER): Payer: Medicare Other | Admitting: Internal Medicine

## 2014-04-03 ENCOUNTER — Other Ambulatory Visit (HOSPITAL_BASED_OUTPATIENT_CLINIC_OR_DEPARTMENT_OTHER): Payer: Medicare Other

## 2014-04-03 VITALS — BP 138/79 | HR 88 | Temp 97.6°F | Resp 18 | Ht 66.0 in | Wt 138.5 lb

## 2014-04-03 DIAGNOSIS — G629 Polyneuropathy, unspecified: Secondary | ICD-10-CM

## 2014-04-03 DIAGNOSIS — C3431 Malignant neoplasm of lower lobe, right bronchus or lung: Secondary | ICD-10-CM

## 2014-04-03 DIAGNOSIS — F329 Major depressive disorder, single episode, unspecified: Secondary | ICD-10-CM

## 2014-04-03 DIAGNOSIS — Z5111 Encounter for antineoplastic chemotherapy: Secondary | ICD-10-CM

## 2014-04-03 DIAGNOSIS — F32A Depression, unspecified: Secondary | ICD-10-CM

## 2014-04-03 DIAGNOSIS — C3490 Malignant neoplasm of unspecified part of unspecified bronchus or lung: Secondary | ICD-10-CM

## 2014-04-03 LAB — COMPREHENSIVE METABOLIC PANEL (CC13)
ALT: 20 U/L (ref 0–55)
AST: 28 U/L (ref 5–34)
Albumin: 3.2 g/dL — ABNORMAL LOW (ref 3.5–5.0)
Alkaline Phosphatase: 114 U/L (ref 40–150)
Anion Gap: 8 meq/L (ref 3–11)
BUN: 17 mg/dL (ref 7.0–26.0)
CO2: 24 meq/L (ref 22–29)
Calcium: 9.9 mg/dL (ref 8.4–10.4)
Chloride: 104 meq/L (ref 98–109)
Creatinine: 0.7 mg/dL (ref 0.7–1.3)
EGFR: >90 ml/min/1.73 m2
Glucose: 103 mg/dL (ref 70–140)
Potassium: 4.5 meq/L (ref 3.5–5.1)
Sodium: 136 meq/L (ref 136–145)
Total Bilirubin: 0.48 mg/dL (ref 0.20–1.20)
Total Protein: 7.2 g/dL (ref 6.4–8.3)

## 2014-04-03 LAB — UA PROTEIN, DIPSTICK - CHCC: Protein, ur: NEGATIVE mg/dL

## 2014-04-03 LAB — CBC WITH DIFFERENTIAL/PLATELET
BASO%: 0.2 % (ref 0.0–2.0)
Basophils Absolute: 0 10*3/uL (ref 0.0–0.1)
EOS%: 0.2 % (ref 0.0–7.0)
Eosinophils Absolute: 0 10*3/uL (ref 0.0–0.5)
HCT: 31.8 % — ABNORMAL LOW (ref 38.4–49.9)
HGB: 9.7 g/dL — ABNORMAL LOW (ref 13.0–17.1)
LYMPH%: 16.5 % (ref 14.0–49.0)
MCH: 29.4 pg (ref 27.2–33.4)
MCHC: 30.5 g/dL — ABNORMAL LOW (ref 32.0–36.0)
MCV: 96.4 fL (ref 79.3–98.0)
MONO#: 0.4 10*3/uL (ref 0.1–0.9)
MONO%: 5.5 % (ref 0.0–14.0)
NEUT#: 5.1 10*3/uL (ref 1.5–6.5)
NEUT%: 77.6 % — ABNORMAL HIGH (ref 39.0–75.0)
Platelets: 276 10*3/uL (ref 140–400)
RBC: 3.3 10*6/uL — ABNORMAL LOW (ref 4.20–5.82)
RDW: 16.3 % — ABNORMAL HIGH (ref 11.0–14.6)
WBC: 6.5 10*3/uL (ref 4.0–10.3)
lymph#: 1.1 10*3/uL (ref 0.9–3.3)

## 2014-04-03 MED ORDER — DEXAMETHASONE SODIUM PHOSPHATE 10 MG/ML IJ SOLN: 10.0000 mg | Freq: Once | INTRAMUSCULAR | Status: DC

## 2014-04-03 MED ORDER — DIPHENHYDRAMINE HCL 50 MG/ML IJ SOLN: 50.0000 mg | Freq: Once | INTRAMUSCULAR | Status: DC

## 2014-04-03 MED ORDER — SODIUM CHLORIDE 0.9 % IJ SOLN
10.0000 mL | INTRAMUSCULAR | Status: DC | PRN
  Administered 2014-04-03: 10 mL
  Filled 2014-04-03: qty 10

## 2014-04-03 MED ORDER — DIPHENHYDRAMINE HCL 25 MG PO CAPS
50.0000 mg | ORAL_CAPSULE | Freq: Once | ORAL | Status: AC
  Administered 2014-04-03: 50 mg via ORAL

## 2014-04-03 MED ORDER — ACETAMINOPHEN 325 MG PO TABS
ORAL_TABLET | ORAL | Status: AC
  Filled 2014-04-03: qty 2

## 2014-04-03 MED ORDER — ACETAMINOPHEN 325 MG PO TABS
650.0000 mg | ORAL_TABLET | Freq: Once | ORAL | Status: AC
  Administered 2014-04-03: 650 mg via ORAL

## 2014-04-03 MED ORDER — SODIUM CHLORIDE 0.9 % IV SOLN
Freq: Once | INTRAVENOUS | Status: AC
  Administered 2014-04-03: 11:00:00 via INTRAVENOUS

## 2014-04-03 MED ORDER — ONDANSETRON 8 MG/50ML IVPB (CHCC): 8.0000 mg | Freq: Once | INTRAVENOUS | Status: DC

## 2014-04-03 MED ORDER — HEPARIN SOD (PORK) LOCK FLUSH 100 UNIT/ML IV SOLN
500.0000 [IU] | Freq: Once | INTRAVENOUS | Status: AC | PRN
  Administered 2014-04-03: 500 [IU]
  Filled 2014-04-03: qty 5

## 2014-04-03 MED ORDER — SODIUM CHLORIDE 0.9 % IV SOLN
10.0000 mg/kg | Freq: Once | INTRAVENOUS | Status: AC
  Administered 2014-04-03: 700 mg via INTRAVENOUS
  Filled 2014-04-03: qty 70

## 2014-04-03 MED ORDER — DIPHENHYDRAMINE HCL 25 MG PO CAPS
ORAL_CAPSULE | ORAL | Status: AC
  Filled 2014-04-03: qty 2
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
## 2014-04-03 NOTE — Patient Instructions (Signed)
[facility] Discharge Instructions for Patients Receiving Chemotherapy  Today you received the following chemotherapy agent Cyramza  To help prevent nausea and vomiting after your treatment, we encourage you to take your nausea medication.   If you develop nausea and vomiting that is not controlled by your nausea medication, call the clinic.   BELOW ARE SYMPTOMS THAT SHOULD BE REPORTED IMMEDIATELY:  *FEVER GREATER THAN 100.5 F  *CHILLS WITH OR WITHOUT FEVER  NAUSEA AND VOMITING THAT IS NOT CONTROLLED WITH YOUR NAUSEA MEDICATION  *UNUSUAL SHORTNESS OF BREATH  *UNUSUAL BRUISING OR BLEEDING  TENDERNESS IN MOUTH AND THROAT WITH OR WITHOUT PRESENCE OF ULCERS  *URINARY PROBLEMS  *BOWEL PROBLEMS  UNUSUAL RASH Items with * indicate a potential emergency and should be followed up as soon as possible.  Feel free to call the clinic you have any questions or concerns. The clinic phone number is [phone].

## 2014-04-03 NOTE — Telephone Encounter (Signed)
Per staff message and POF I have scheduled appts. Advised scheduler of appts. JMW  

## 2014-04-03 NOTE — Telephone Encounter (Signed)
Gave avs & cal for Dec. °

## 2014-04-05 DIAGNOSIS — G629 Polyneuropathy, unspecified: Secondary | ICD-10-CM | POA: Insufficient documentation

## 2014-04-10 ENCOUNTER — Other Ambulatory Visit (HOSPITAL_BASED_OUTPATIENT_CLINIC_OR_DEPARTMENT_OTHER): Payer: Medicare Other

## 2014-04-10 DIAGNOSIS — C3431 Malignant neoplasm of lower lobe, right bronchus or lung: Secondary | ICD-10-CM

## 2014-04-10 DIAGNOSIS — C3490 Malignant neoplasm of unspecified part of unspecified bronchus or lung: Secondary | ICD-10-CM

## 2014-04-10 LAB — COMPREHENSIVE METABOLIC PANEL (CC13)
ALK PHOS: 123 U/L (ref 40–150)
ALT: 22 U/L (ref 0–55)
AST: 26 U/L (ref 5–34)
Albumin: 3 g/dL — ABNORMAL LOW (ref 3.5–5.0)
Anion Gap: 8 mEq/L (ref 3–11)
BUN: 11.7 mg/dL (ref 7.0–26.0)
CALCIUM: 8.8 mg/dL (ref 8.4–10.4)
CHLORIDE: 106 meq/L (ref 98–109)
CO2: 25 mEq/L (ref 22–29)
CREATININE: 0.8 mg/dL (ref 0.7–1.3)
GLUCOSE: 92 mg/dL (ref 70–140)
POTASSIUM: 4.2 meq/L (ref 3.5–5.1)
Sodium: 138 mEq/L (ref 136–145)
Total Bilirubin: 0.37 mg/dL (ref 0.20–1.20)
Total Protein: 6.4 g/dL (ref 6.4–8.3)

## 2014-04-10 LAB — CBC WITH DIFFERENTIAL/PLATELET
BASO%: 0.9 % (ref 0.0–2.0)
BASOS ABS: 0 10*3/uL (ref 0.0–0.1)
EOS ABS: 0.2 10*3/uL (ref 0.0–0.5)
EOS%: 6 % (ref 0.0–7.0)
HCT: 30.3 % — ABNORMAL LOW (ref 38.4–49.9)
HEMOGLOBIN: 9.3 g/dL — AB (ref 13.0–17.1)
LYMPH%: 32.3 % (ref 14.0–49.0)
MCH: 29.6 pg (ref 27.2–33.4)
MCHC: 30.7 g/dL — ABNORMAL LOW (ref 32.0–36.0)
MCV: 96.5 fL (ref 79.3–98.0)
MONO#: 0.5 10*3/uL (ref 0.1–0.9)
MONO%: 14.2 % — AB (ref 0.0–14.0)
NEUT%: 46.6 % (ref 39.0–75.0)
NEUTROS ABS: 1.5 10*3/uL (ref 1.5–6.5)
Platelets: 190 10*3/uL (ref 140–400)
RBC: 3.14 10*6/uL — ABNORMAL LOW (ref 4.20–5.82)
RDW: 16.1 % — AB (ref 11.0–14.6)
WBC: 3.2 10*3/uL — ABNORMAL LOW (ref 4.0–10.3)
lymph#: 1 10*3/uL (ref 0.9–3.3)

## 2014-04-15 ENCOUNTER — Encounter (HOSPITAL_COMMUNITY): Payer: Self-pay

## 2014-04-15 ENCOUNTER — Emergency Department (HOSPITAL_COMMUNITY)
Admission: EM | Admit: 2014-04-15 | Discharge: 2014-04-15 | Disposition: A | Discharge status: Home / Self Care | Payer: Medicare Other | Attending: Emergency Medicine | Admitting: Emergency Medicine

## 2014-04-15 ENCOUNTER — Emergency Department (HOSPITAL_COMMUNITY): Payer: Medicare Other

## 2014-04-15 DIAGNOSIS — J45909 Unspecified asthma, uncomplicated: Secondary | ICD-10-CM | POA: Diagnosis not present

## 2014-04-15 DIAGNOSIS — R109 Unspecified abdominal pain: Secondary | ICD-10-CM | POA: Insufficient documentation

## 2014-04-15 DIAGNOSIS — R1033 Periumbilical pain: Secondary | ICD-10-CM | POA: Insufficient documentation

## 2014-04-15 DIAGNOSIS — Z9889 Other specified postprocedural states: Secondary | ICD-10-CM | POA: Insufficient documentation

## 2014-04-15 DIAGNOSIS — Z87891 Personal history of nicotine dependence: Secondary | ICD-10-CM | POA: Diagnosis not present

## 2014-04-15 DIAGNOSIS — Z8719 Personal history of other diseases of the digestive system: Secondary | ICD-10-CM | POA: Diagnosis not present

## 2014-04-15 DIAGNOSIS — Z79899 Other long term (current) drug therapy: Secondary | ICD-10-CM | POA: Diagnosis not present

## 2014-04-15 DIAGNOSIS — Z85118 Personal history of other malignant neoplasm of bronchus and lung: Secondary | ICD-10-CM | POA: Insufficient documentation

## 2014-04-15 DIAGNOSIS — M1990 Unspecified osteoarthritis, unspecified site: Secondary | ICD-10-CM | POA: Insufficient documentation

## 2014-04-15 DIAGNOSIS — R1013 Epigastric pain: Secondary | ICD-10-CM | POA: Diagnosis present

## 2014-04-15 DIAGNOSIS — R112 Nausea with vomiting, unspecified: Secondary | ICD-10-CM | POA: Insufficient documentation

## 2014-04-15 DIAGNOSIS — Z8669 Personal history of other diseases of the nervous system and sense organs: Secondary | ICD-10-CM | POA: Insufficient documentation

## 2014-04-15 DIAGNOSIS — Z86010 Personal history of colonic polyps: Secondary | ICD-10-CM | POA: Diagnosis not present

## 2014-04-15 LAB — URINALYSIS, ROUTINE W REFLEX MICROSCOPIC
BILIRUBIN URINE: NEGATIVE
Glucose, UA: NEGATIVE mg/dL
HGB URINE DIPSTICK: NEGATIVE
Ketones, ur: NEGATIVE mg/dL
Leukocytes, UA: NEGATIVE
Nitrite: NEGATIVE
PROTEIN: NEGATIVE mg/dL
Specific Gravity, Urine: >1.046 — ABNORMAL HIGH (ref 1.005–1.030)
UROBILINOGEN UA: 1 mg/dL (ref 0.0–1.0)
pH: 6 (ref 5.0–8.0)

## 2014-04-15 LAB — CBC WITH DIFFERENTIAL/PLATELET
BASOS ABS: 0 10*3/uL (ref 0.0–0.1)
BASOS PCT: 0 % (ref 0–1)
EOS PCT: 2 % (ref 0–5)
Eosinophils Absolute: 0.1 10*3/uL (ref 0.0–0.7)
HCT: 33.3 % — ABNORMAL LOW (ref 39.0–52.0)
Hemoglobin: 10.1 g/dL — ABNORMAL LOW (ref 13.0–17.0)
Lymphocytes Relative: 24 % (ref 12–46)
Lymphs Abs: 1.2 10*3/uL (ref 0.7–4.0)
MCH: 29.4 pg (ref 26.0–34.0)
MCHC: 30.3 g/dL (ref 30.0–36.0)
MCV: 96.8 fL (ref 78.0–100.0)
MONO ABS: 0.6 10*3/uL (ref 0.1–1.0)
Monocytes Relative: 12 % (ref 3–12)
Neutro Abs: 3.1 10*3/uL (ref 1.7–7.7)
Neutrophils Relative %: 62 % (ref 43–77)
Platelets: 249 10*3/uL (ref 150–400)
RBC: 3.44 MIL/uL — ABNORMAL LOW (ref 4.22–5.81)
RDW: 16.2 % — ABNORMAL HIGH (ref 11.5–15.5)
WBC: 5.1 10*3/uL (ref 4.0–10.5)

## 2014-04-15 LAB — TROPONIN I: Troponin I: <0.3 ng/mL (ref <0.30)

## 2014-04-15 LAB — COMPREHENSIVE METABOLIC PANEL
ALT: 17 U/L (ref 0–53)
ANION GAP: 13 (ref 5–15)
AST: 27 U/L (ref 0–37)
Albumin: 3.1 g/dL — ABNORMAL LOW (ref 3.5–5.2)
Alkaline Phosphatase: 128 U/L — ABNORMAL HIGH (ref 39–117)
BUN: 11 mg/dL (ref 6–23)
CO2: 23 mEq/L (ref 19–32)
CREATININE: 0.63 mg/dL (ref 0.50–1.35)
Calcium: 9.3 mg/dL (ref 8.4–10.5)
Chloride: 102 mEq/L (ref 96–112)
GFR calc non Af Amer: >90 mL/min (ref >90)
GLUCOSE: 123 mg/dL — AB (ref 70–99)
Potassium: 4.5 mEq/L (ref 3.7–5.3)
SODIUM: 138 meq/L (ref 137–147)
TOTAL PROTEIN: 6.8 g/dL (ref 6.0–8.3)
Total Bilirubin: 0.6 mg/dL (ref 0.3–1.2)

## 2014-04-15 LAB — I-STAT CG4 LACTIC ACID, ED: Lactic Acid, Venous: 0.63 mmol/L (ref 0.5–2.2)

## 2014-04-15 LAB — LIPASE, BLOOD: Lipase: 19 U/L (ref 11–59)

## 2014-04-15 MED ORDER — OXYCODONE-ACETAMINOPHEN 5-325 MG PO TABS: 1.0000 | ORAL_TABLET | Freq: Four times a day (QID) | ORAL | Status: DC | PRN

## 2014-04-15 MED ORDER — IOHEXOL 300 MG/ML  SOLN
80.0000 mL | Freq: Once | INTRAMUSCULAR | Status: AC | PRN
  Administered 2014-04-15: 80 mL via INTRAVENOUS

## 2014-04-15 MED ORDER — HYDROMORPHONE HCL 1 MG/ML IJ SOLN
1.0000 mg | Freq: Once | INTRAMUSCULAR | Status: AC
  Administered 2014-04-15: 1 mg via INTRAVENOUS
  Filled 2014-04-15: qty 1

## 2014-04-15 MED ORDER — SODIUM CHLORIDE 0.9 % IJ SOLN
INTRAMUSCULAR | Status: AC
  Filled 2014-04-15: qty 10

## 2014-04-15 MED ORDER — IOHEXOL 300 MG/ML  SOLN
50.0000 mL | Freq: Once | INTRAMUSCULAR | Status: AC | PRN
  Administered 2014-04-15: 50 mL via ORAL

## 2014-04-15 MED ORDER — HYDROMORPHONE HCL 2 MG/ML IJ SOLN
2.0000 mg | Freq: Once | INTRAMUSCULAR | Status: AC
  Administered 2014-04-15: 2 mg via INTRAVENOUS
  Filled 2014-04-15: qty 1

## 2014-04-15 MED ORDER — SODIUM CHLORIDE 0.9 % IV BOLUS (SEPSIS)
1000.0000 mL | Freq: Once | INTRAVENOUS | Status: AC
  Administered 2014-04-15: 1000 mL via INTRAVENOUS

## 2014-04-15 MED ORDER — ONDANSETRON 4 MG PO TBDP: 4.0000 mg | ORAL_TABLET | Freq: Three times a day (TID) | ORAL | Status: AC | PRN

## 2014-04-15 MED ORDER — ONDANSETRON HCL 4 MG/2ML IJ SOLN
4.0000 mg | Freq: Once | INTRAMUSCULAR | Status: AC
  Administered 2014-04-15: 4 mg via INTRAVENOUS
  Filled 2014-04-15: qty 2

## 2014-04-15 MED ORDER — HEPARIN SOD (PORK) LOCK FLUSH 100 UNIT/ML IV SOLN
500.0000 [IU] | Freq: Once | INTRAVENOUS | Status: AC
  Administered 2014-04-15: 500 [IU]
  Filled 2014-04-15: qty 5

## 2014-04-15 NOTE — ED Notes (Signed)
MD at bedside. 

## 2014-04-15 NOTE — ED Notes (Signed)
Pt c/o upper abdominal pain and emesis starting last night.  Pain score 4/10.  Denies diarrhea.  Last BM yesterday.  Pt has not taken anything for pain or nausea.  Hx of lung CA.  Last chemo treatment 12/3.

## 2014-04-15 NOTE — Discharge Instructions (Signed)
Please follow up with your primary care physician in 1-2 days. If you do not have one please call the [facility] number listed above. Please follow up with Dr. Earlie Server to schedule a follow up appointment. Please take pain medication and/or muscle relaxants as prescribed and as needed for pain. Please do not drive on narcotic pain medication or on muscle relaxants. Please read all discharge instructions and return precautions.   Nausea and Vomiting Nausea is a sick feeling that often comes before throwing up (vomiting). Vomiting is a reflex where stomach contents come out of your mouth. Vomiting can cause severe loss of body fluids (dehydration). Children and elderly adults can become dehydrated quickly, especially if they also have diarrhea. Nausea and vomiting are symptoms of a condition or disease. It is important to find the cause of your symptoms. CAUSES   Direct irritation of the stomach lining. This irritation can result from increased acid production (gastroesophageal reflux disease), infection, food poisoning, taking certain medicines (such as nonsteroidal anti-inflammatory drugs), alcohol use, or tobacco use.  Signals from the brain.These signals could be caused by a headache, heat exposure, an inner ear disturbance, increased pressure in the brain from injury, infection, a tumor, or a concussion, pain, emotional stimulus, or metabolic problems.  An obstruction in the gastrointestinal tract (bowel obstruction).  Illnesses such as diabetes, hepatitis, gallbladder problems, appendicitis, kidney problems, cancer, sepsis, atypical symptoms of a heart attack, or eating disorders.  Medical treatments such as chemotherapy and radiation.  Receiving medicine that makes you sleep (general anesthetic) during surgery. DIAGNOSIS Your caregiver may ask for tests to be done if the problems do not improve after a few days. Tests may also be done if symptoms are severe or if the  reason for the nausea and vomiting is not clear. Tests may include:  Urine tests.  Blood tests.  Stool tests.  Cultures (to look for evidence of infection).  X-rays or other imaging studies. Test results can help your caregiver make decisions about treatment or the need for additional tests. TREATMENT You need to stay well hydrated. Drink frequently but in small amounts.You may wish to drink water, sports drinks, clear broth, or eat frozen ice pops or gelatin dessert to help stay hydrated.When you eat, eating slowly may help prevent nausea.There are also some antinausea medicines that may help prevent nausea. HOME CARE INSTRUCTIONS   Take all medicine as directed by your caregiver.  If you do not have an appetite, do not force yourself to eat. However, you must continue to drink fluids.  If you have an appetite, eat a normal diet unless your caregiver tells you differently.  Eat a variety of complex carbohydrates (rice, wheat, potatoes, bread), lean meats, yogurt, fruits, and vegetables.  Avoid high-fat foods because they are more difficult to digest.  Drink enough water and fluids to keep your urine clear or pale yellow.  If you are dehydrated, ask your caregiver for specific rehydration instructions. Signs of dehydration may include:  Severe thirst.  Dry lips and mouth.  Dizziness.  Dark urine.  Decreasing urine frequency and amount.  Confusion.  Rapid breathing or pulse. SEEK IMMEDIATE MEDICAL CARE IF:   You have blood or brown flecks (like coffee grounds) in your vomit.  You have black or bloody stools.  You have a severe headache or stiff neck.  You are confused.  You have severe abdominal pain.  You have chest pain or trouble breathing.  You do not urinate at least  once every 8 hours.  You develop cold or clammy skin.  You continue to vomit for longer than 24 to 48 hours.  You have a fever. MAKE SURE YOU:   Understand these  instructions.  Will watch your condition.  Will get help right away if you are not doing well or get worse. Document Released: 04/18/2005 Document Revised: 07/11/2011 Document Reviewed: 09/15/2010 [facility] Patient Information 2015 [place], [place]. This information is not intended to replace advice given to you by your health care provider. Make sure you discuss any questions you have with your health care provider.  Abdominal Pain Many things can cause abdominal pain. Usually, abdominal pain is not caused by a disease and will improve without treatment. It can often be observed and treated at home. Your health care provider will do a physical exam and possibly order blood tests and X-rays to help determine the seriousness of your pain. However, in many cases, more time must pass before a clear cause of the pain can be found. Before that point, your health care provider may not know if you need more testing or further treatment. HOME CARE INSTRUCTIONS  Monitor your abdominal pain for any changes. The following actions may help to alleviate any discomfort you are experiencing:  Only take over-the-counter or prescription medicines as directed by your health care provider.  Do not take laxatives unless directed to do so by your health care provider.  Try a clear liquid diet (broth, tea, or water) as directed by your health care provider. Slowly move to a bland diet as tolerated. SEEK MEDICAL CARE IF:  You have unexplained abdominal pain.  You have abdominal pain associated with nausea or diarrhea.  You have pain when you urinate or have a bowel movement.  You experience abdominal pain that wakes you in the night.  You have abdominal pain that is worsened or improved by eating food.  You have abdominal pain that is worsened with eating fatty foods.  You have a fever. SEEK IMMEDIATE MEDICAL CARE IF:   Your pain does not go away within 2 hours.  You keep throwing up (vomiting).  Your  pain is felt only in portions of the abdomen, such as the right side or the left lower portion of the abdomen.  You pass bloody or black tarry stools. MAKE SURE YOU:  Understand these instructions.   Will watch your condition.   Will get help right away if you are not doing well or get worse.  Document Released: 01/26/2005 Document Revised: 04/23/2013 Document Reviewed: 12/26/2012 [facility] Patient Information 2015 [place], [place]. This information is not intended to replace advice given to you by your health care provider. Make sure you discuss any questions you have with your health care provider.
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
## 2014-04-15 NOTE — ED Notes (Signed)
Reminded pt about urine sample and gave pt a urinal. Pt said he would try in a few minutes.

## 2014-04-15 NOTE — ED Notes (Signed)
Patient transported to CT 

## 2014-04-15 NOTE — ED Notes (Signed)
Pt is aware that a urine sample is needed.  

## 2014-04-16 LAB — URINE CULTURE
Colony Count: NO GROWTH
Culture: NO GROWTH

## 2014-04-17 ENCOUNTER — Other Ambulatory Visit (HOSPITAL_BASED_OUTPATIENT_CLINIC_OR_DEPARTMENT_OTHER): Payer: Medicare Other

## 2014-04-17 DIAGNOSIS — C3490 Malignant neoplasm of unspecified part of unspecified bronchus or lung: Secondary | ICD-10-CM

## 2014-04-17 DIAGNOSIS — C3431 Malignant neoplasm of lower lobe, right bronchus or lung: Secondary | ICD-10-CM

## 2014-04-17 LAB — COMPREHENSIVE METABOLIC PANEL (CC13)
ALT: 17 U/L (ref 0–55)
AST: 23 U/L (ref 5–34)
Albumin: 2.9 g/dL — ABNORMAL LOW (ref 3.5–5.0)
Alkaline Phosphatase: 132 U/L (ref 40–150)
Anion Gap: 9 mEq/L (ref 3–11)
BUN: 14.1 mg/dL (ref 7.0–26.0)
CO2: 23 meq/L (ref 22–29)
Calcium: 9 mg/dL (ref 8.4–10.4)
Chloride: 104 mEq/L (ref 98–109)
Creatinine: 0.8 mg/dL (ref 0.7–1.3)
EGFR: >90 mL/min/{1.73_m2} (ref >90)
GLUCOSE: 119 mg/dL (ref 70–140)
POTASSIUM: 4.3 meq/L (ref 3.5–5.1)
SODIUM: 136 meq/L (ref 136–145)
TOTAL PROTEIN: 6.5 g/dL (ref 6.4–8.3)
Total Bilirubin: 0.39 mg/dL (ref 0.20–1.20)

## 2014-04-17 LAB — CBC WITH DIFFERENTIAL/PLATELET
BASO%: 0.2 % (ref 0.0–2.0)
Basophils Absolute: 0 10*3/uL (ref 0.0–0.1)
EOS ABS: 0 10*3/uL (ref 0.0–0.5)
EOS%: 0.2 % (ref 0.0–7.0)
HCT: 29.8 % — ABNORMAL LOW (ref 38.4–49.9)
HEMOGLOBIN: 9.1 g/dL — AB (ref 13.0–17.1)
LYMPH#: 1.3 10*3/uL (ref 0.9–3.3)
LYMPH%: 23.1 % (ref 14.0–49.0)
MCH: 29.1 pg (ref 27.2–33.4)
MCHC: 30.5 g/dL — ABNORMAL LOW (ref 32.0–36.0)
MCV: 95.2 fL (ref 79.3–98.0)
MONO#: 0.5 10*3/uL (ref 0.1–0.9)
MONO%: 8.1 % (ref 0.0–14.0)
NEUT%: 68.4 % (ref 39.0–75.0)
NEUTROS ABS: 4 10*3/uL (ref 1.5–6.5)
NRBC: 0 % (ref 0–0)
Platelets: ADEQUATE 10*3/uL (ref 140–400)
RBC: 3.13 10*6/uL — ABNORMAL LOW (ref 4.20–5.82)
RDW: 16.2 % — AB (ref 11.0–14.6)
WBC: 5.8 10*3/uL (ref 4.0–10.3)

## 2014-04-23 ENCOUNTER — Ambulatory Visit (HOSPITAL_BASED_OUTPATIENT_CLINIC_OR_DEPARTMENT_OTHER): Payer: Medicare Other | Admitting: Physician Assistant

## 2014-04-23 ENCOUNTER — Telehealth: Payer: Self-pay | Admitting: *Deleted

## 2014-04-23 ENCOUNTER — Encounter: Payer: Self-pay | Admitting: Physician Assistant

## 2014-04-23 ENCOUNTER — Ambulatory Visit (HOSPITAL_BASED_OUTPATIENT_CLINIC_OR_DEPARTMENT_OTHER): Payer: Medicare Other

## 2014-04-23 ENCOUNTER — Telehealth: Payer: Self-pay | Admitting: Physician Assistant

## 2014-04-23 ENCOUNTER — Ambulatory Visit (HOSPITAL_BASED_OUTPATIENT_CLINIC_OR_DEPARTMENT_OTHER): Payer: Medicare Other | Admitting: Lab

## 2014-04-23 VITALS — BP 134/76 | HR 90 | Temp 98.0°F | Resp 18 | Ht 66.0 in | Wt 137.7 lb

## 2014-04-23 DIAGNOSIS — C3490 Malignant neoplasm of unspecified part of unspecified bronchus or lung: Secondary | ICD-10-CM

## 2014-04-23 DIAGNOSIS — C3431 Malignant neoplasm of lower lobe, right bronchus or lung: Secondary | ICD-10-CM

## 2014-04-23 DIAGNOSIS — Z5112 Encounter for antineoplastic immunotherapy: Secondary | ICD-10-CM

## 2014-04-23 LAB — CBC WITH DIFFERENTIAL/PLATELET
BASO%: 0.4 % (ref 0.0–2.0)
BASOS ABS: 0 10*3/uL (ref 0.0–0.1)
EOS%: 0 % (ref 0.0–7.0)
Eosinophils Absolute: 0 10*3/uL (ref 0.0–0.5)
HCT: 30.9 % — ABNORMAL LOW (ref 38.4–49.9)
HGB: 9.6 g/dL — ABNORMAL LOW (ref 13.0–17.1)
LYMPH#: 1.3 10*3/uL (ref 0.9–3.3)
LYMPH%: 11.5 % — ABNORMAL LOW (ref 14.0–49.0)
MCH: 28.7 pg (ref 27.2–33.4)
MCHC: 31 g/dL — AB (ref 32.0–36.0)
MCV: 92.7 fL (ref 79.3–98.0)
MONO#: 0.7 10*3/uL (ref 0.1–0.9)
MONO%: 6.3 % (ref 0.0–14.0)
NEUT#: 9.3 10*3/uL — ABNORMAL HIGH (ref 1.5–6.5)
NEUT%: 81.8 % — AB (ref 39.0–75.0)
Platelets: 250 10*3/uL (ref 140–400)
RBC: 3.34 10*6/uL — ABNORMAL LOW (ref 4.20–5.82)
RDW: 16.3 % — AB (ref 11.0–14.6)
WBC: 11.4 10*3/uL — ABNORMAL HIGH (ref 4.0–10.3)

## 2014-04-23 LAB — COMPREHENSIVE METABOLIC PANEL (CC13)
ALT: 18 U/L (ref 0–55)
AST: 24 U/L (ref 5–34)
Albumin: 3.3 g/dL — ABNORMAL LOW (ref 3.5–5.0)
Alkaline Phosphatase: 122 U/L (ref 40–150)
Anion Gap: 10 mEq/L (ref 3–11)
BUN: 17.7 mg/dL (ref 7.0–26.0)
CALCIUM: 9.5 mg/dL (ref 8.4–10.4)
CHLORIDE: 102 meq/L (ref 98–109)
CO2: 23 mEq/L (ref 22–29)
CREATININE: 0.8 mg/dL (ref 0.7–1.3)
EGFR: >90 mL/min/{1.73_m2} (ref >90)
Glucose: 100 mg/dl (ref 70–140)
Potassium: 4.6 mEq/L (ref 3.5–5.1)
Sodium: 135 mEq/L — ABNORMAL LOW (ref 136–145)
Total Bilirubin: 0.33 mg/dL (ref 0.20–1.20)
Total Protein: 7.1 g/dL (ref 6.4–8.3)

## 2014-04-23 MED ORDER — ONDANSETRON 8 MG/50ML IVPB (CHCC): 8.0000 mg | Freq: Once | INTRAVENOUS | Status: DC

## 2014-04-23 MED ORDER — DEXAMETHASONE SODIUM PHOSPHATE 10 MG/ML IJ SOLN: 10.0000 mg | Freq: Once | INTRAMUSCULAR | Status: DC

## 2014-04-23 MED ORDER — OXYCODONE-ACETAMINOPHEN 5-325 MG PO TABS
1.0000 | ORAL_TABLET | Freq: Once | ORAL | Status: AC
  Administered 2014-04-23: 1 via ORAL

## 2014-04-23 MED ORDER — ACETAMINOPHEN 325 MG PO TABS
650.0000 mg | ORAL_TABLET | Freq: Once | ORAL | Status: AC
  Administered 2014-04-23: 650 mg via ORAL

## 2014-04-23 MED ORDER — DIPHENHYDRAMINE HCL 50 MG/ML IJ SOLN
INTRAMUSCULAR | Status: AC
  Filled 2014-04-23: qty 1

## 2014-04-23 MED ORDER — DEXAMETHASONE SODIUM PHOSPHATE 10 MG/ML IJ SOLN
INTRAMUSCULAR | Status: AC
  Filled 2014-04-23: qty 1

## 2014-04-23 MED ORDER — ACETAMINOPHEN 325 MG PO TABS
ORAL_TABLET | ORAL | Status: AC
  Filled 2014-04-23: qty 2

## 2014-04-23 MED ORDER — SODIUM CHLORIDE 0.9 % IV SOLN
Freq: Once | INTRAVENOUS | Status: AC
  Administered 2014-04-23: 10:00:00 via INTRAVENOUS

## 2014-04-23 MED ORDER — HEPARIN SOD (PORK) LOCK FLUSH 100 UNIT/ML IV SOLN
500.0000 [IU] | Freq: Once | INTRAVENOUS | Status: AC | PRN
  Administered 2014-04-23: 500 [IU]
  Filled 2014-04-23: qty 5

## 2014-04-23 MED ORDER — SODIUM CHLORIDE 0.9 % IV SOLN
10.0000 mg/kg | Freq: Once | INTRAVENOUS | Status: AC
  Administered 2014-04-23: 700 mg via INTRAVENOUS
  Filled 2014-04-23: qty 70

## 2014-04-23 MED ORDER — DIPHENHYDRAMINE HCL 50 MG/ML IJ SOLN
50.0000 mg | Freq: Once | INTRAMUSCULAR | Status: AC
  Administered 2014-04-23: 50 mg via INTRAVENOUS

## 2014-04-23 MED ORDER — OXYCODONE-ACETAMINOPHEN 5-325 MG PO TABS
ORAL_TABLET | ORAL | Status: AC
  Filled 2014-04-23: qty 1

## 2014-04-23 MED ORDER — ONDANSETRON 8 MG/NS 50 ML IVPB
INTRAVENOUS | Status: AC
  Filled 2014-04-23: qty 8

## 2014-04-23 MED ORDER — SODIUM CHLORIDE 0.9 % IJ SOLN
10.0000 mL | INTRAMUSCULAR | Status: DC | PRN
  Administered 2014-04-23: 10 mL
  Filled 2014-04-23: qty 10

## 2014-04-23 NOTE — Telephone Encounter (Signed)
Gave asv & cal for Jan. Sent mess to sch tx.

## 2014-04-23 NOTE — Patient Instructions (Addendum)
[facility] Discharge Instructions for Patients Receiving Chemotherapy  Today you received the following chemotherapy agents : cyramza  To help prevent nausea and vomiting after your treatment, we encourage you to take your nausea medication as directed   If you develop nausea and vomiting that is not controlled by your nausea medication, call the clinic.   BELOW ARE SYMPTOMS THAT SHOULD BE REPORTED IMMEDIATELY:  *FEVER GREATER THAN 100.5 F  *CHILLS WITH OR WITHOUT FEVER  NAUSEA AND VOMITING THAT IS NOT CONTROLLED WITH YOUR NAUSEA MEDICATION  *UNUSUAL SHORTNESS OF BREATH  *UNUSUAL BRUISING OR BLEEDING  TENDERNESS IN MOUTH AND THROAT WITH OR WITHOUT PRESENCE OF ULCERS  *URINARY PROBLEMS  *BOWEL PROBLEMS  UNUSUAL RASH Items with * indicate a potential emergency and should be followed up as soon as possible.  Feel free to call the clinic should you have any questions or concerns. The clinic phone number is [phone].

## 2014-04-23 NOTE — Patient Instructions (Signed)
Continue labs and chemotherapy as scheduled Follow-up in 3 weeks with a restaging CT scan of your chest to reevaluate your disease, prior to next scheduled cycle of chemotherapy
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
## 2014-04-23 NOTE — Telephone Encounter (Signed)
Per staff message and POF I have scheduled appts. Advised scheduler of appts. JMW  

## 2014-04-24 ENCOUNTER — Other Ambulatory Visit: Payer: Medicare Other

## 2014-04-24 ENCOUNTER — Ambulatory Visit: Payer: Medicare Other

## 2014-04-26 ENCOUNTER — Ambulatory Visit: Payer: Medicare Other

## 2014-04-30 ENCOUNTER — Other Ambulatory Visit: Payer: Medicare Other

## 2014-05-01 ENCOUNTER — Ambulatory Visit (HOSPITAL_BASED_OUTPATIENT_CLINIC_OR_DEPARTMENT_OTHER): Payer: Medicare Other

## 2014-05-01 ENCOUNTER — Telehealth: Payer: Self-pay | Admitting: Internal Medicine

## 2014-05-01 DIAGNOSIS — C3431 Malignant neoplasm of lower lobe, right bronchus or lung: Secondary | ICD-10-CM

## 2014-05-01 DIAGNOSIS — C3490 Malignant neoplasm of unspecified part of unspecified bronchus or lung: Secondary | ICD-10-CM

## 2014-05-01 LAB — CBC WITH DIFFERENTIAL/PLATELET
BASO%: 0.8 % (ref 0.0–2.0)
BASOS ABS: 0.1 10*3/uL (ref 0.0–0.1)
EOS%: 3.4 % (ref 0.0–7.0)
Eosinophils Absolute: 0.2 10*3/uL (ref 0.0–0.5)
HCT: 30.1 % — ABNORMAL LOW (ref 38.4–49.9)
HEMOGLOBIN: 8.9 g/dL — AB (ref 13.0–17.1)
LYMPH%: 23.1 % (ref 14.0–49.0)
MCH: 27.3 pg (ref 27.2–33.4)
MCHC: 29.7 g/dL — ABNORMAL LOW (ref 32.0–36.0)
MCV: 91.8 fL (ref 79.3–98.0)
MONO#: 0.8 10*3/uL (ref 0.1–0.9)
MONO%: 12.5 % (ref 0.0–14.0)
NEUT#: 3.7 10*3/uL (ref 1.5–6.5)
NEUT%: 60.2 % (ref 39.0–75.0)
Platelets: 267 10*3/uL (ref 140–400)
RBC: 3.27 10*6/uL — ABNORMAL LOW (ref 4.20–5.82)
RDW: 17.2 % — AB (ref 11.0–14.6)
WBC: 6.1 10*3/uL (ref 4.0–10.3)
lymph#: 1.4 10*3/uL (ref 0.9–3.3)

## 2014-05-01 LAB — COMPREHENSIVE METABOLIC PANEL (CC13)
ALBUMIN: 2.8 g/dL — AB (ref 3.5–5.0)
ALT: 12 U/L (ref 0–55)
ANION GAP: 8 meq/L (ref 3–11)
AST: 19 U/L (ref 5–34)
Alkaline Phosphatase: 134 U/L (ref 40–150)
BUN: 14.5 mg/dL (ref 7.0–26.0)
CHLORIDE: 106 meq/L (ref 98–109)
CO2: 24 meq/L (ref 22–29)
CREATININE: 1.1 mg/dL (ref 0.7–1.3)
Calcium: 8.7 mg/dL (ref 8.4–10.4)
EGFR: 79 mL/min/{1.73_m2} — ABNORMAL LOW (ref >90)
GLUCOSE: 118 mg/dL (ref 70–140)
POTASSIUM: 4.2 meq/L (ref 3.5–5.1)
Sodium: 138 mEq/L (ref 136–145)
Total Bilirubin: 0.37 mg/dL (ref 0.20–1.20)
Total Protein: 6.5 g/dL (ref 6.4–8.3)

## 2014-05-01 NOTE — Telephone Encounter (Signed)
pt called to sched appt for today...done....pt ok and aware

## 2014-05-07 ENCOUNTER — Encounter (HOSPITAL_COMMUNITY): Payer: Self-pay

## 2014-05-07 ENCOUNTER — Ambulatory Visit (HOSPITAL_COMMUNITY)
Admission: RE | Admit: 2014-05-07 | Discharge: 2014-05-07 | Disposition: A | Discharge status: Home / Self Care | Payer: Medicare Other | Source: Ambulatory Visit | Attending: Physician Assistant | Admitting: Physician Assistant

## 2014-05-07 ENCOUNTER — Other Ambulatory Visit (HOSPITAL_BASED_OUTPATIENT_CLINIC_OR_DEPARTMENT_OTHER): Payer: Medicare Other

## 2014-05-07 DIAGNOSIS — C3431 Malignant neoplasm of lower lobe, right bronchus or lung: Secondary | ICD-10-CM | POA: Diagnosis not present

## 2014-05-07 DIAGNOSIS — C3490 Malignant neoplasm of unspecified part of unspecified bronchus or lung: Secondary | ICD-10-CM

## 2014-05-07 LAB — CBC WITH DIFFERENTIAL/PLATELET
BASO%: 0.5 % (ref 0.0–2.0)
Basophils Absolute: 0 10*3/uL (ref 0.0–0.1)
EOS ABS: 0.1 10*3/uL (ref 0.0–0.5)
EOS%: 1.1 % (ref 0.0–7.0)
HCT: 27.7 % — ABNORMAL LOW (ref 38.4–49.9)
HGB: 8.2 g/dL — ABNORMAL LOW (ref 13.0–17.1)
LYMPH%: 16.7 % (ref 14.0–49.0)
MCH: 26.9 pg — ABNORMAL LOW (ref 27.2–33.4)
MCHC: 29.7 g/dL — ABNORMAL LOW (ref 32.0–36.0)
MCV: 90.7 fL (ref 79.3–98.0)
MONO#: 0.4 10*3/uL (ref 0.1–0.9)
MONO%: 5.2 % (ref 0.0–14.0)
NEUT#: 6.5 10*3/uL (ref 1.5–6.5)
NEUT%: 76.5 % — ABNORMAL HIGH (ref 39.0–75.0)
PLATELETS: 263 10*3/uL (ref 140–400)
RBC: 3.05 10*6/uL — ABNORMAL LOW (ref 4.20–5.82)
RDW: 17.4 % — ABNORMAL HIGH (ref 11.0–14.6)
WBC: 8.5 10*3/uL (ref 4.0–10.3)
lymph#: 1.4 10*3/uL (ref 0.9–3.3)

## 2014-05-07 LAB — COMPREHENSIVE METABOLIC PANEL (CC13)
ALBUMIN: 3.1 g/dL — AB (ref 3.5–5.0)
ALK PHOS: 140 U/L (ref 40–150)
ALT: 14 U/L (ref 0–55)
ANION GAP: 9 meq/L (ref 3–11)
AST: 25 U/L (ref 5–34)
BUN: 11.6 mg/dL (ref 7.0–26.0)
CHLORIDE: 107 meq/L (ref 98–109)
CO2: 24 mEq/L (ref 22–29)
Calcium: 9.2 mg/dL (ref 8.4–10.4)
Creatinine: 0.8 mg/dL (ref 0.7–1.3)
EGFR: >90 mL/min/{1.73_m2} (ref >90)
Glucose: 87 mg/dl (ref 70–140)
Potassium: 4 mEq/L (ref 3.5–5.1)
SODIUM: 140 meq/L (ref 136–145)
TOTAL PROTEIN: 6.7 g/dL (ref 6.4–8.3)
Total Bilirubin: 0.46 mg/dL (ref 0.20–1.20)

## 2014-05-07 MED ORDER — IOHEXOL 300 MG/ML  SOLN
80.0000 mL | Freq: Once | INTRAMUSCULAR | Status: AC | PRN
  Administered 2014-05-07: 80 mL via INTRAVENOUS

## 2014-05-12 ENCOUNTER — Telehealth: Payer: Self-pay | Admitting: Medical Oncology

## 2014-05-12 DIAGNOSIS — C3431 Malignant neoplasm of lower lobe, right bronchus or lung: Secondary | ICD-10-CM

## 2014-05-12 MED ORDER — DEXAMETHASONE 4 MG PO TABS: ORAL_TABLET | ORAL | Status: DC

## 2014-05-12 NOTE — Telephone Encounter (Signed)
Needs dex refill -Done

## 2014-05-14 ENCOUNTER — Ambulatory Visit (HOSPITAL_BASED_OUTPATIENT_CLINIC_OR_DEPARTMENT_OTHER): Payer: Medicare Other | Admitting: Internal Medicine

## 2014-05-14 ENCOUNTER — Ambulatory Visit: Payer: Medicare Other

## 2014-05-14 ENCOUNTER — Encounter: Payer: Self-pay | Admitting: Internal Medicine

## 2014-05-14 ENCOUNTER — Telehealth: Payer: Self-pay | Admitting: Internal Medicine

## 2014-05-14 ENCOUNTER — Other Ambulatory Visit: Payer: Self-pay | Admitting: Medical Oncology

## 2014-05-14 ENCOUNTER — Ambulatory Visit (HOSPITAL_BASED_OUTPATIENT_CLINIC_OR_DEPARTMENT_OTHER): Payer: Medicare Other

## 2014-05-14 ENCOUNTER — Ambulatory Visit (HOSPITAL_COMMUNITY)
Admission: RE | Admit: 2014-05-14 | Discharge: 2014-05-14 | Disposition: A | Discharge status: Home / Self Care | Payer: Medicare Other | Source: Ambulatory Visit | Attending: Internal Medicine | Admitting: Internal Medicine

## 2014-05-14 ENCOUNTER — Other Ambulatory Visit (HOSPITAL_BASED_OUTPATIENT_CLINIC_OR_DEPARTMENT_OTHER): Payer: Medicare Other

## 2014-05-14 VITALS — BP 139/88 | HR 75 | Temp 98.0°F | Resp 20

## 2014-05-14 VITALS — BP 120/80 | HR 94 | Temp 97.6°F | Resp 18 | Ht 66.0 in | Wt 133.6 lb

## 2014-05-14 DIAGNOSIS — F329 Major depressive disorder, single episode, unspecified: Secondary | ICD-10-CM

## 2014-05-14 DIAGNOSIS — D6489 Other specified anemias: Secondary | ICD-10-CM

## 2014-05-14 DIAGNOSIS — C3490 Malignant neoplasm of unspecified part of unspecified bronchus or lung: Secondary | ICD-10-CM

## 2014-05-14 DIAGNOSIS — D649 Anemia, unspecified: Secondary | ICD-10-CM

## 2014-05-14 DIAGNOSIS — C3431 Malignant neoplasm of lower lobe, right bronchus or lung: Secondary | ICD-10-CM

## 2014-05-14 DIAGNOSIS — Z95828 Presence of other vascular implants and grafts: Secondary | ICD-10-CM

## 2014-05-14 DIAGNOSIS — G629 Polyneuropathy, unspecified: Secondary | ICD-10-CM

## 2014-05-14 LAB — CBC WITH DIFFERENTIAL/PLATELET
BASO%: 0 % (ref 0.0–2.0)
BASOS ABS: 0 10*3/uL (ref 0.0–0.1)
EOS ABS: 0 10*3/uL (ref 0.0–0.5)
EOS%: 0 % (ref 0.0–7.0)
HCT: 25.9 % — ABNORMAL LOW (ref 38.4–49.9)
HEMOGLOBIN: 7.7 g/dL — AB (ref 13.0–17.1)
LYMPH%: 12.9 % — ABNORMAL LOW (ref 14.0–49.0)
MCH: 26.2 pg — ABNORMAL LOW (ref 27.2–33.4)
MCHC: 29.7 g/dL — ABNORMAL LOW (ref 32.0–36.0)
MCV: 88.1 fL (ref 79.3–98.0)
MONO#: 0.4 10*3/uL (ref 0.1–0.9)
MONO%: 5.6 % (ref 0.0–14.0)
NEUT#: 5.9 10*3/uL (ref 1.5–6.5)
NEUT%: 81.5 % — ABNORMAL HIGH (ref 39.0–75.0)
NRBC: 0 % (ref 0–0)
Platelets: 341 10*3/uL (ref 140–400)
RBC: 2.94 10*6/uL — AB (ref 4.20–5.82)
RDW: 16.5 % — AB (ref 11.0–14.6)
WBC: 7.3 10*3/uL (ref 4.0–10.3)
lymph#: 0.9 10*3/uL (ref 0.9–3.3)

## 2014-05-14 LAB — COMPREHENSIVE METABOLIC PANEL (CC13)
ALK PHOS: 135 U/L (ref 40–150)
ALT: 7 U/L (ref 0–55)
AST: 15 U/L (ref 5–34)
Albumin: 2.6 g/dL — ABNORMAL LOW (ref 3.5–5.0)
Anion Gap: 9 mEq/L (ref 3–11)
BILIRUBIN TOTAL: 0.35 mg/dL (ref 0.20–1.20)
BUN: 19.5 mg/dL (ref 7.0–26.0)
CO2: 21 mEq/L — ABNORMAL LOW (ref 22–29)
CREATININE: 0.8 mg/dL (ref 0.7–1.3)
Calcium: 9.3 mg/dL (ref 8.4–10.4)
Chloride: 108 mEq/L (ref 98–109)
GLUCOSE: 163 mg/dL — AB (ref 70–140)
Potassium: 4.5 mEq/L (ref 3.5–5.1)
SODIUM: 138 meq/L (ref 136–145)
Total Protein: 7 g/dL (ref 6.4–8.3)

## 2014-05-14 LAB — ABO/RH: ABO/RH(D): A POS

## 2014-05-14 LAB — PREPARE RBC (CROSSMATCH)

## 2014-05-14 MED ORDER — HEPARIN SOD (PORK) LOCK FLUSH 100 UNIT/ML IV SOLN
500.0000 [IU] | Freq: Every day | INTRAVENOUS | Status: AC | PRN
Start: 2014-05-14 — End: 2014-05-14
  Administered 2014-05-14: 500 [IU]
  Filled 2014-05-14: qty 5

## 2014-05-14 MED ORDER — ACETAMINOPHEN 325 MG PO TABS
ORAL_TABLET | ORAL | Status: AC
  Filled 2014-05-14: qty 2

## 2014-05-14 MED ORDER — ACETAMINOPHEN 325 MG PO TABS
650.0000 mg | ORAL_TABLET | Freq: Once | ORAL | Status: AC
  Administered 2014-05-14: 650 mg via ORAL

## 2014-05-14 MED ORDER — SODIUM CHLORIDE 0.9 % IJ SOLN
10.0000 mL | INTRAMUSCULAR | Status: DC | PRN
  Administered 2014-05-14: 10 mL via INTRAVENOUS
  Filled 2014-05-14: qty 10

## 2014-05-14 MED ORDER — SODIUM CHLORIDE 0.9 % IV SOLN
250.0000 mL | Freq: Once | INTRAVENOUS | Status: AC
  Administered 2014-05-14: 250 mL via INTRAVENOUS

## 2014-05-14 MED ORDER — DIPHENHYDRAMINE HCL 25 MG PO CAPS
25.0000 mg | ORAL_CAPSULE | Freq: Once | ORAL | Status: AC
  Administered 2014-05-14: 25 mg via ORAL

## 2014-05-14 MED ORDER — SODIUM CHLORIDE 0.9 % IJ SOLN
10.0000 mL | INTRAMUSCULAR | Status: AC | PRN
  Administered 2014-05-14: 10 mL
  Filled 2014-05-14: qty 10

## 2014-05-14 MED ORDER — HEPARIN SOD (PORK) LOCK FLUSH 100 UNIT/ML IV SOLN
500.0000 [IU] | Freq: Once | INTRAVENOUS | Status: AC
  Administered 2014-05-14: 500 [IU] via INTRAVENOUS
  Filled 2014-05-14: qty 5

## 2014-05-14 MED ORDER — DIPHENHYDRAMINE HCL 25 MG PO CAPS
ORAL_CAPSULE | ORAL | Status: AC
  Filled 2014-05-14: qty 1

## 2014-05-14 NOTE — Telephone Encounter (Signed)
gv adn printed appt sched and avs for pt for Jan and Feb..Marland Kitchen

## 2014-05-14 NOTE — Progress Notes (Signed)
Pt reports "black stool " for 3 weeks with abdominal pain . He is taking Oxycodone ( last dose Monday ) and has also been taking Aleve. I instructed pt to stop Aleve. Hemoccult cards give to pt with instructions.
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
## 2014-05-14 NOTE — Patient Instructions (Signed)

## 2014-05-14 NOTE — Patient Instructions (Signed)

## 2014-05-14 NOTE — Progress Notes (Signed)
HAR DONE

## 2014-05-15 ENCOUNTER — Ambulatory Visit: Payer: Medicare Other

## 2014-05-15 LAB — TYPE AND SCREEN
ABO/RH(D): A POS
Antibody Screen: NEGATIVE
Unit division: 0
Unit division: 0

## 2014-05-16 ENCOUNTER — Other Ambulatory Visit (HOSPITAL_BASED_OUTPATIENT_CLINIC_OR_DEPARTMENT_OTHER): Payer: Medicare Other

## 2014-05-16 ENCOUNTER — Other Ambulatory Visit: Payer: Self-pay | Admitting: Internal Medicine

## 2014-05-16 DIAGNOSIS — C3431 Malignant neoplasm of lower lobe, right bronchus or lung: Secondary | ICD-10-CM

## 2014-05-16 DIAGNOSIS — D649 Anemia, unspecified: Secondary | ICD-10-CM

## 2014-05-16 LAB — FECAL OCCULT BLOOD, GUAIAC: OCCULT BLOOD: POSITIVE

## 2014-05-17 ENCOUNTER — Inpatient Hospital Stay (HOSPITAL_COMMUNITY)
Admission: EM | Admit: 2014-05-17 | Discharge: 2014-05-27 | DRG: 393 | Disposition: A | Discharge status: Hospice | Payer: Medicare Other | Attending: Internal Medicine | Admitting: Internal Medicine

## 2014-05-17 ENCOUNTER — Encounter (HOSPITAL_COMMUNITY): Payer: Self-pay | Admitting: Emergency Medicine

## 2014-05-17 DIAGNOSIS — K631 Perforation of intestine (nontraumatic): Principal | ICD-10-CM

## 2014-05-17 DIAGNOSIS — R109 Unspecified abdominal pain: Secondary | ICD-10-CM | POA: Diagnosis not present

## 2014-05-17 DIAGNOSIS — K559 Vascular disorder of intestine, unspecified: Secondary | ICD-10-CM | POA: Diagnosis present

## 2014-05-17 DIAGNOSIS — Z87891 Personal history of nicotine dependence: Secondary | ICD-10-CM

## 2014-05-17 DIAGNOSIS — K5900 Constipation, unspecified: Secondary | ICD-10-CM | POA: Diagnosis present

## 2014-05-17 DIAGNOSIS — R1084 Generalized abdominal pain: Secondary | ICD-10-CM

## 2014-05-17 DIAGNOSIS — R1031 Right lower quadrant pain: Secondary | ICD-10-CM | POA: Diagnosis present

## 2014-05-17 DIAGNOSIS — C3490 Malignant neoplasm of unspecified part of unspecified bronchus or lung: Secondary | ICD-10-CM

## 2014-05-17 DIAGNOSIS — C7989 Secondary malignant neoplasm of other specified sites: Secondary | ICD-10-CM | POA: Diagnosis present

## 2014-05-17 DIAGNOSIS — C3431 Malignant neoplasm of lower lobe, right bronchus or lung: Secondary | ICD-10-CM | POA: Diagnosis present

## 2014-05-17 DIAGNOSIS — Z801 Family history of malignant neoplasm of trachea, bronchus and lung: Secondary | ICD-10-CM

## 2014-05-17 DIAGNOSIS — R110 Nausea: Secondary | ICD-10-CM

## 2014-05-17 DIAGNOSIS — R591 Generalized enlarged lymph nodes: Secondary | ICD-10-CM | POA: Diagnosis present

## 2014-05-17 DIAGNOSIS — K219 Gastro-esophageal reflux disease without esophagitis: Secondary | ICD-10-CM | POA: Diagnosis present

## 2014-05-17 DIAGNOSIS — C799 Secondary malignant neoplasm of unspecified site: Secondary | ICD-10-CM

## 2014-05-17 DIAGNOSIS — R1032 Left lower quadrant pain: Secondary | ICD-10-CM | POA: Diagnosis present

## 2014-05-17 DIAGNOSIS — Z885 Allergy status to narcotic agent status: Secondary | ICD-10-CM

## 2014-05-17 DIAGNOSIS — Z9221 Personal history of antineoplastic chemotherapy: Secondary | ICD-10-CM

## 2014-05-17 DIAGNOSIS — D638 Anemia in other chronic diseases classified elsewhere: Secondary | ICD-10-CM | POA: Diagnosis present

## 2014-05-17 DIAGNOSIS — Z515 Encounter for palliative care: Secondary | ICD-10-CM

## 2014-05-17 DIAGNOSIS — R140 Abdominal distension (gaseous): Secondary | ICD-10-CM

## 2014-05-17 DIAGNOSIS — K659 Peritonitis, unspecified: Secondary | ICD-10-CM | POA: Diagnosis present

## 2014-05-17 DIAGNOSIS — R188 Other ascites: Secondary | ICD-10-CM | POA: Diagnosis present

## 2014-05-17 DIAGNOSIS — Z923 Personal history of irradiation: Secondary | ICD-10-CM

## 2014-05-17 DIAGNOSIS — K922 Gastrointestinal hemorrhage, unspecified: Secondary | ICD-10-CM | POA: Diagnosis present

## 2014-05-17 DIAGNOSIS — Z66 Do not resuscitate: Secondary | ICD-10-CM | POA: Diagnosis not present

## 2014-05-17 LAB — COMPREHENSIVE METABOLIC PANEL
ALT: 14 U/L (ref 0–53)
AST: 20 U/L (ref 0–37)
Albumin: 2.9 g/dL — ABNORMAL LOW (ref 3.5–5.2)
Alkaline Phosphatase: 134 U/L — ABNORMAL HIGH (ref 39–117)
Anion gap: 10 (ref 5–15)
BUN: 12 mg/dL (ref 6–23)
CALCIUM: 8.8 mg/dL (ref 8.4–10.5)
CO2: 25 mmol/L (ref 19–32)
CREATININE: 0.85 mg/dL (ref 0.50–1.35)
Chloride: 100 mEq/L (ref 96–112)
GFR calc Af Amer: >90 mL/min (ref >90)
GFR calc non Af Amer: >90 mL/min (ref >90)
GLUCOSE: 103 mg/dL — AB (ref 70–99)
Potassium: 3.9 mmol/L (ref 3.5–5.1)
SODIUM: 135 mmol/L (ref 135–145)
Total Bilirubin: 1 mg/dL (ref 0.3–1.2)
Total Protein: 6.8 g/dL (ref 6.0–8.3)

## 2014-05-17 LAB — CBC WITH DIFFERENTIAL/PLATELET
BASOS ABS: 0 10*3/uL (ref 0.0–0.1)
Basophils Relative: 0 % (ref 0–1)
EOS PCT: 0 % (ref 0–5)
Eosinophils Absolute: 0 10*3/uL (ref 0.0–0.7)
HEMATOCRIT: 37.8 % — AB (ref 39.0–52.0)
Hemoglobin: 11.7 g/dL — ABNORMAL LOW (ref 13.0–17.0)
Lymphocytes Relative: 18 % (ref 12–46)
Lymphs Abs: 1.8 10*3/uL (ref 0.7–4.0)
MCH: 27.3 pg (ref 26.0–34.0)
MCHC: 31 g/dL (ref 30.0–36.0)
MCV: 88.1 fL (ref 78.0–100.0)
MONOS PCT: 13 % — AB (ref 3–12)
Monocytes Absolute: 1.3 10*3/uL — ABNORMAL HIGH (ref 0.1–1.0)
NEUTROS ABS: 6.7 10*3/uL (ref 1.7–7.7)
Neutrophils Relative %: 69 % (ref 43–77)
Platelets: 241 10*3/uL (ref 150–400)
RBC: 4.29 MIL/uL (ref 4.22–5.81)
RDW: 16 % — ABNORMAL HIGH (ref 11.5–15.5)
WBC: 9.8 10*3/uL (ref 4.0–10.5)

## 2014-05-17 LAB — URINALYSIS, ROUTINE W REFLEX MICROSCOPIC
Glucose, UA: NEGATIVE mg/dL
Hgb urine dipstick: NEGATIVE
KETONES UR: NEGATIVE mg/dL
Leukocytes, UA: NEGATIVE
Nitrite: NEGATIVE
PH: 5 (ref 5.0–8.0)
Protein, ur: NEGATIVE mg/dL
Specific Gravity, Urine: 1.022 (ref 1.005–1.030)
Urobilinogen, UA: 1 mg/dL (ref 0.0–1.0)

## 2014-05-17 LAB — LIPASE, BLOOD: Lipase: 24 U/L (ref 11–59)

## 2014-05-17 LAB — POC OCCULT BLOOD, ED: FECAL OCCULT BLD: POSITIVE — AB

## 2014-05-17 MED ORDER — SODIUM CHLORIDE 0.9 % IV BOLUS (SEPSIS)
500.0000 mL | Freq: Once | INTRAVENOUS | Status: AC
  Administered 2014-05-17: 500 mL via INTRAVENOUS

## 2014-05-17 MED ORDER — HYDROMORPHONE HCL 1 MG/ML IJ SOLN
0.5000 mg | Freq: Once | INTRAMUSCULAR | Status: AC
  Administered 2014-05-17: 0.5 mg via INTRAVENOUS
  Filled 2014-05-17: qty 1

## 2014-05-17 MED ORDER — FAMOTIDINE IN NACL 20-0.9 MG/50ML-% IV SOLN
20.0000 mg | Freq: Once | INTRAVENOUS | Status: AC
  Administered 2014-05-17: 20 mg via INTRAVENOUS
  Filled 2014-05-17: qty 50
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
## 2014-05-17 NOTE — ED Notes (Signed)
Pt from home c/o abdominal pain that started yesterday. Denies Nausea, vomiting, or diarrhea. Last BM yesterday and normal. Pt is being treated for stage 3 lung CA.

## 2014-05-17 NOTE — ED Provider Notes (Signed)
Complains of lower abdominal pain since yesterday. Denies vomiting admits to diminished appetite however presently hungry. Patient reports he was told he had blood in his stool yesterday. On exam patient is alert nontoxic abdomen nondistended normal bowel sounds. Tender at lower quadrants. Genitalia normal male. Rectum normal tone nontender brown stool with black flecks. Hemoccult positive  Orlie Dakin, MD 05/17/14 1929
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
## 2014-05-18 ENCOUNTER — Inpatient Hospital Stay (HOSPITAL_COMMUNITY): Payer: Medicare Other

## 2014-05-18 ENCOUNTER — Encounter (HOSPITAL_COMMUNITY): Payer: Self-pay | Admitting: Radiology

## 2014-05-18 DIAGNOSIS — R188 Other ascites: Secondary | ICD-10-CM | POA: Diagnosis present

## 2014-05-18 DIAGNOSIS — Z9221 Personal history of antineoplastic chemotherapy: Secondary | ICD-10-CM | POA: Diagnosis not present

## 2014-05-18 DIAGNOSIS — C3431 Malignant neoplasm of lower lobe, right bronchus or lung: Secondary | ICD-10-CM | POA: Diagnosis present

## 2014-05-18 DIAGNOSIS — C7989 Secondary malignant neoplasm of other specified sites: Secondary | ICD-10-CM | POA: Diagnosis present

## 2014-05-18 DIAGNOSIS — R591 Generalized enlarged lymph nodes: Secondary | ICD-10-CM | POA: Diagnosis present

## 2014-05-18 DIAGNOSIS — K922 Gastrointestinal hemorrhage, unspecified: Secondary | ICD-10-CM | POA: Insufficient documentation

## 2014-05-18 DIAGNOSIS — R1032 Left lower quadrant pain: Secondary | ICD-10-CM

## 2014-05-18 DIAGNOSIS — R109 Unspecified abdominal pain: Secondary | ICD-10-CM | POA: Diagnosis present

## 2014-05-18 DIAGNOSIS — D638 Anemia in other chronic diseases classified elsewhere: Secondary | ICD-10-CM | POA: Diagnosis present

## 2014-05-18 DIAGNOSIS — Z801 Family history of malignant neoplasm of trachea, bronchus and lung: Secondary | ICD-10-CM | POA: Diagnosis not present

## 2014-05-18 DIAGNOSIS — K559 Vascular disorder of intestine, unspecified: Secondary | ICD-10-CM | POA: Diagnosis present

## 2014-05-18 DIAGNOSIS — K659 Peritonitis, unspecified: Secondary | ICD-10-CM | POA: Diagnosis present

## 2014-05-18 DIAGNOSIS — Z515 Encounter for palliative care: Secondary | ICD-10-CM | POA: Diagnosis not present

## 2014-05-18 DIAGNOSIS — Z87891 Personal history of nicotine dependence: Secondary | ICD-10-CM | POA: Diagnosis not present

## 2014-05-18 DIAGNOSIS — C3490 Malignant neoplasm of unspecified part of unspecified bronchus or lung: Secondary | ICD-10-CM

## 2014-05-18 DIAGNOSIS — Z885 Allergy status to narcotic agent status: Secondary | ICD-10-CM | POA: Diagnosis not present

## 2014-05-18 DIAGNOSIS — K5900 Constipation, unspecified: Secondary | ICD-10-CM | POA: Diagnosis present

## 2014-05-18 DIAGNOSIS — R1031 Right lower quadrant pain: Secondary | ICD-10-CM | POA: Diagnosis present

## 2014-05-18 DIAGNOSIS — K219 Gastro-esophageal reflux disease without esophagitis: Secondary | ICD-10-CM | POA: Diagnosis present

## 2014-05-18 DIAGNOSIS — K631 Perforation of intestine (nontraumatic): Secondary | ICD-10-CM | POA: Diagnosis present

## 2014-05-18 DIAGNOSIS — Z66 Do not resuscitate: Secondary | ICD-10-CM | POA: Diagnosis not present

## 2014-05-18 DIAGNOSIS — Z923 Personal history of irradiation: Secondary | ICD-10-CM | POA: Diagnosis not present

## 2014-05-18 LAB — BASIC METABOLIC PANEL
Anion gap: 10 (ref 5–15)
BUN: 10 mg/dL (ref 6–23)
CALCIUM: 8.3 mg/dL — AB (ref 8.4–10.5)
CHLORIDE: 101 meq/L (ref 96–112)
CO2: 22 mmol/L (ref 19–32)
CREATININE: 0.84 mg/dL (ref 0.50–1.35)
Glucose, Bld: 111 mg/dL — ABNORMAL HIGH (ref 70–99)
Potassium: 3.8 mmol/L (ref 3.5–5.1)
Sodium: 133 mmol/L — ABNORMAL LOW (ref 135–145)

## 2014-05-18 LAB — CBC
HCT: 34.3 % — ABNORMAL LOW (ref 39.0–52.0)
Hemoglobin: 10.8 g/dL — ABNORMAL LOW (ref 13.0–17.0)
MCH: 27.8 pg (ref 26.0–34.0)
MCHC: 31.5 g/dL (ref 30.0–36.0)
MCV: 88.4 fL (ref 78.0–100.0)
Platelets: 208 10*3/uL (ref 150–400)
RBC: 3.88 MIL/uL — AB (ref 4.22–5.81)
RDW: 16.3 % — ABNORMAL HIGH (ref 11.5–15.5)
WBC: 9.6 10*3/uL (ref 4.0–10.5)

## 2014-05-18 MED ORDER — DEXTROSE-NACL 5-0.9 % IV SOLN
INTRAVENOUS | Status: AC
Start: 2014-05-18 — End: 2014-05-19
  Administered 2014-05-18 – 2014-05-19 (×3): via INTRAVENOUS

## 2014-05-18 MED ORDER — KETOROLAC TROMETHAMINE 30 MG/ML IJ SOLN
30.0000 mg | Freq: Three times a day (TID) | INTRAMUSCULAR | Status: AC | PRN
  Administered 2014-05-19 – 2014-05-22 (×4): 30 mg via INTRAVENOUS
  Filled 2014-05-18 (×4): qty 1

## 2014-05-18 MED ORDER — ONDANSETRON 4 MG PO TBDP
4.0000 mg | ORAL_TABLET | Freq: Three times a day (TID) | ORAL | Status: DC | PRN
  Filled 2014-05-18: qty 1

## 2014-05-18 MED ORDER — HYDROMORPHONE HCL 1 MG/ML IJ SOLN
0.5000 mg | INTRAMUSCULAR | Status: DC | PRN
  Administered 2014-05-18 – 2014-05-26 (×22): 0.5 mg via INTRAVENOUS
  Filled 2014-05-18 (×22): qty 1

## 2014-05-18 MED ORDER — SODIUM CHLORIDE 0.9 % IV SOLN
250.0000 mL | INTRAVENOUS | Status: DC | PRN
  Administered 2014-05-23: 250 mL via INTRAVENOUS

## 2014-05-18 MED ORDER — IOHEXOL 300 MG/ML  SOLN
100.0000 mL | Freq: Once | INTRAMUSCULAR | Status: AC | PRN
  Administered 2014-05-18: 100 mL via INTRAVENOUS

## 2014-05-18 MED ORDER — PIPERACILLIN-TAZOBACTAM 3.375 G IVPB 30 MIN
3.3750 g | Freq: Once | INTRAVENOUS | Status: AC
  Administered 2014-05-18: 3.375 g via INTRAVENOUS
  Filled 2014-05-18: qty 50

## 2014-05-18 MED ORDER — PIPERACILLIN-TAZOBACTAM 3.375 G IVPB
3.3750 g | Freq: Three times a day (TID) | INTRAVENOUS | Status: DC
  Administered 2014-05-18 – 2014-05-26 (×25): 3.375 g via INTRAVENOUS
  Filled 2014-05-18 (×28): qty 50

## 2014-05-18 MED ORDER — SODIUM CHLORIDE 0.9 % IV SOLN: INTRAVENOUS | Status: DC

## 2014-05-18 MED ORDER — SODIUM CHLORIDE 0.9 % IV SOLN
INTRAVENOUS | Status: AC
  Administered 2014-05-18 (×2): via INTRAVENOUS

## 2014-05-18 MED ORDER — OXYCODONE-ACETAMINOPHEN 5-325 MG PO TABS: 1.0000 | ORAL_TABLET | Freq: Four times a day (QID) | ORAL | Status: DC | PRN

## 2014-05-18 MED ORDER — PANTOPRAZOLE SODIUM 40 MG IV SOLR
40.0000 mg | Freq: Every day | INTRAVENOUS | Status: DC
  Administered 2014-05-18 – 2014-05-26 (×9): 40 mg via INTRAVENOUS
  Filled 2014-05-18 (×10): qty 40

## 2014-05-18 MED ORDER — SODIUM CHLORIDE 0.9 % IJ SOLN: 3.0000 mL | INTRAMUSCULAR | Status: DC | PRN

## 2014-05-18 MED ORDER — SODIUM CHLORIDE 0.9 % IJ SOLN
3.0000 mL | Freq: Two times a day (BID) | INTRAMUSCULAR | Status: DC
  Administered 2014-05-18 – 2014-05-27 (×4): 3 mL via INTRAVENOUS

## 2014-05-18 MED ORDER — BISACODYL 10 MG RE SUPP
5.0000 mg | Freq: Once | RECTAL | Status: AC
  Administered 2014-05-18: 5 mg via RECTAL
  Filled 2014-05-18: qty 1

## 2014-05-18 MED ORDER — IOHEXOL 300 MG/ML  SOLN
25.0000 mL | INTRAMUSCULAR | Status: AC
  Administered 2014-05-18 (×2): 25 mL via ORAL

## 2014-05-18 NOTE — Progress Notes (Signed)
64yo male w/ recent chemotherapy c/o diffuse abdominal pain that started yesterday, is being seen regularly for anemia and rectal bleeding, known metastases to lymph node and small bowel tumor infiltration, concern for infection, to begin IV ABX.  Will start Zosyn 3.375g IV Q8H for CrCl ~75 ml/min and monitor CBC and Cx.  Wynona Neat, PharmD, BCPS 05/18/2014 5:18 AM

## 2014-05-18 NOTE — Progress Notes (Signed)
UR Completed.  [phone]  
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
## 2014-05-18 NOTE — Progress Notes (Signed)
Subjective: Patient w/ mild abdominal pain on exam. Appears comfortable, no acute distress. Many family members present in room currently. BP stable, currently afebrile.   Objective: Vital signs in last 24 hours: Filed Vitals:   05/17/14 1655 05/17/14 1921 05/17/14 2317 05/18/14 0457  BP: 132/87 119/81 138/93   Pulse: 114 104 101   Temp: 97.5 F (36.4 C) 97.9 F (36.6 C) 100.8 F (38.2 C) 100.6 F (38.1 C)  TempSrc: Oral Oral Oral Oral  Resp: 18 16 17 16   Height:   5\' 6"  (1.676 m)   Weight:   132 lb 4.8 oz (60.011 kg)   SpO2: 98% 97% 99% 98%   Weight change:   Intake/Output Summary (Last 24 hours) at 05/18/14 1225 Last data filed at 05/18/14 0847  Gross per 24 hour  Intake      0 ml  Output    100 ml  Net   -100 ml   Physical Exam: General: Thin appearing AA male, alert, cooperative, NAD. Chronically ill-appearing.  HEENT: Left eye prosthesis. Right eye w/ normal EOM, PRRL.  Neck: Full range of motion without pain, supple, no lymphadenopathy or carotid bruits Lungs: Clear to ascultation bilaterally, normal work of respiration, no wheezes, rales, rhonchi Heart: Tachycardic, no murmurs, gallops, or rubs Abdomen: Soft, Tender to palpation diffusely, mostly RLQ and LLQ, distended. Infrequent bowel sounds.  Extremities: No cyanosis, clubbing, or edema Neurologic: Alert & oriented X3, cranial nerves II-XII intact, strength grossly intact, sensation intact to light touch   Lab Results: Basic Metabolic Panel:  Recent Labs Lab 05/17/14 1715 05/18/14 0932  NA 135 133*  K 3.9 3.8  CL 100 101  CO2 25 22  GLUCOSE 103* 111*  BUN 12 10  CREATININE 0.85 0.84  CALCIUM 8.8 8.3*   Liver Function Tests:  Recent Labs Lab 05/14/14 0920 05/17/14 1715  AST 15 20  ALT 7 14  ALKPHOS 135 134*  BILITOT 0.35 1.0  PROT 7.0 6.8  ALBUMIN 2.6* 2.9*    Recent Labs Lab 05/17/14 1715  LIPASE 24   CBC:  Recent Labs Lab 05/14/14 0920 05/17/14 1715 05/18/14 0932  WBC  7.3 9.8 9.6  NEUTROABS 5.9 6.7  --   HGB 7.7* 11.7* 10.8*  HCT 25.9* 37.8* 34.3*  MCV 88.1 88.1 88.4  PLT 341 241 208   Urinalysis:  Recent Labs Lab 05/17/14 1825  COLORURINE AMBER*  LABSPEC 1.022  PHURINE 5.0  GLUCOSEU NEGATIVE  HGBUR NEGATIVE  BILIRUBINUR SMALL*  KETONESUR NEGATIVE  PROTEINUR NEGATIVE  UROBILINOGEN 1.0  NITRITE NEGATIVE  LEUKOCYTESUR NEGATIVE   Studies/Results: Ct Abdomen Pelvis W Contrast  05/18/2014   CLINICAL DATA:  Acute onset of right lower quadrant abdominal pain for 2 days. Bloody stools and abdominal tenderness.  EXAM: CT ABDOMEN AND PELVIS WITH CONTRAST  TECHNIQUE: Multidetector CT imaging of the abdomen and pelvis was performed using the standard protocol following bolus administration of intravenous contrast.  CONTRAST:  176mL OMNIPAQUE IOHEXOL 300 MG/ML  SOLN  COMPARISON:  CT of the abdomen and pelvis from 04/15/2014  FINDINGS: A small right pleural effusion is again noted. The centrally cavitary mass at the right lower lobe has increased mildly in size, now measuring approximately 4.3 cm, reflecting the patient's known lung adenocarcinoma. A trace left pleural effusion is noted.  There is increased moderate ascites noted about the liver and spleen, tracking along both paracolic gutters into the pelvis. There is mild new peripheral enhancement about the fluid in the pelvis. Though the fluid  remains simple in attenuation, its attenuation is slightly higher than on the prior study, raising concern for mild underlying complexity. No focal abscess formation is yet seen.  There is significantly worsened mesenteric edema and diffuse mucosal edema involving much of the jejunum and proximal ileum. The amount of mesenteric edema and wall thickening raises concern for early changes of bowel ischemia along much of the jejunum and proximal ileum. This corresponds to interval enlargement of the patient's underlying mesenteric masses, measuring up to 2.8 cm in size.  Along  the proximal ileum, at the site of the previously noted diffuse tumor infiltration into the ileal wall, there now appears to be oral contrast extending across the wall of the small bowel, though still contained by the serosa. This is compatible with a focal contained perforation.  The clinically described bloody stools likely arise from tumor infiltration at the proximal sigmoid colon, where there is significantly worsened circumferential wall thickening. This is adjacent to the site of ileal infiltration.  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 There is vague hypodensity surrounding the stomach, which could reflect a small amount of underlying tumor and associated fluid.  Wall thickening is noted along the distal esophagus, perhaps mildly more apparent than on the prior study.  Scattered tiny hypodensities within the liver remain nonspecific. The spleen is unremarkable in appearance. The gallbladder is difficult to fully assess due to surrounding fluid, but appears grossly unremarkable. The pancreas and adrenal glands are unremarkable.  Scattered bilateral renal cysts are seen. The kidneys are otherwise unremarkable. Mild nonspecific perinephric stranding is noted bilaterally. There is no evidence of hydronephrosis. No renal or ureteral stones are seen.  Enlarged periaortic nodes measure up to 1.5 cm in short axis. No acute vascular abnormalities are seen. Scattered calcification is seen along the abdominal aorta and its branches.  The appendix is filled with contrast, but difficult to fully assess due to surrounding edema. The remainder of the colon is filled with contrast, to the level of the infiltrating mass at the proximal sigmoid colon, and is grossly unremarkable.  The bladder is mildly distended and grossly unremarkable in appearance. The prostate remains normal in size, with scattered calcification. No inguinal lymphadenopathy is seen.  No acute osseous abnormalities  are identified. Vacuum phenomenon is noted at L5-S1.  IMPRESSION: 1. Along the proximal ileum, at the site of previously noted diffuse tumor infiltration into the ileal wall, there is oral contrast extending across the wall of the small bowel, though still contained by the serosa. This is compatible with a focal contained perforation. 2. Significantly worsened mesenteric edema and diffuse mucosal edema involving much of the jejunum and proximal ileum. This raises question for early changes of bowel ischemia along the jejunum and proximal ileum, in conjunction with interval enlargement of underlying mesenteric masses, measuring up to 2.8 cm in size. 3. Clinically described bloody stools likely arise from tumor infiltration of the proximal sigmoid colon, with significantly worsened circumferential wall thickening. This is adjacent to the ileal infiltration. 4. Increased moderate ascites about the liver and spleen, tracking along both paracolic gutters into the pelvis. Mild new peripheral enhancement about the fluid in the pelvis. Though the fluid remains simple in attenuation, slightly increased attenuation raises concern for mild complexity. No focal abscess yet seen. 5. 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 Vague hypodensity surrounding the stomach could reflect a small amount of underlying tumor and associated fluid. Wall thickening at the distal esophagus is perhaps slightly more apparent  than on the prior study. 6. Enlarged periaortic nodes measure up to 1.5 cm in short axis. 7. Small right pleural effusion again noted. Centrally cavitary mass at the right lower lobe has increased mildly in size, now measuring 4.3 cm, reflecting the patient's known lung adenocarcinoma. Trace left pleural effusion noted. 8. Nonspecific tiny hypodensities within the liver.  These results were called by telephone at the time of interpretation on 05/18/2014 at 5:05 am to Dr. Sherrine Maples, who verbally acknowledged these  results.   Electronically Signed   By: Garald Balding M.D.   On: 05/18/2014 05:30   Medications: I have reviewed the patient's current medications. Scheduled Meds: . pantoprazole (PROTONIX) IV  40 mg Intravenous Daily  . piperacillin-tazobactam (ZOSYN)  IV  3.375 g Intravenous Q8H  . sodium chloride  3 mL Intravenous Q12H   Continuous Infusions:  PRN Meds:.sodium chloride, HYDROmorphone (DILAUDID) injection, sodium chloride   Assessment/Plan: 64 y/o M w/ PMHx of Lung Adenocarcinoma w/ suspected metastases to the abdomen, admitted for severe abdominal pain, thought to have metastatic involvement of the ileum w/ possible microperforation.   Abdominal pain: Patient quite tender on exam, distended, infrequent bowel sounds, but noted to be present. CT abdomen/pelvis on admission showed diffuse tumor infiltration of the proximal ileum, along w/ oral contrast extending across the wall of the small bowel, though still contained by the serosa, compatible with a focal contained perforation. Scan was also significant for worsened mesenteric edema and diffuse mucosal edema involving much of the jejunum and proximal ileum, raising the question of bowel ischemia along the jejunum and proximal ileum, in conjunction with interval enlargement of underlying mesenteric masses. Also suggests tumor infiltration of the proximal sigmoid colon, with significantly worsened circumferential wall thickening. Lastly, showed moderate ascites near the liver and spleen, tracking into the pelvis. As well as diffuse omental edema concerning for omental metastases. Seen by surgery this AM, feel that patient is a poor candidate for surgery and this would not improve his quality of life. Also seen by Dr. Lindi Adie w/ medical oncology, feel that medical management w/ IVF, bowel rest, and ABx is the best option at this time.  -Keep NPO for now -Continue IVF; D5 NS @ 125 cc/hr -Continue Zosyn  -Zofran prn  -Dilaudid 0.5 mg q4h prn for pain  + Toradol 30 mg q8h prn for pain (does not want to take Dilaudid) -Protonix 40 mg IV qd  GI bleed: FOBT positive prior to admission. Hb Stable, trend as follows:   Recent Labs Lab 05/14/14 0920 05/17/14 1715 05/18/14 0932  HGB 7.7* 11.7* 10.8*  HCT 25.9* 37.8* 34.3*  WBC 7.3 9.8 9.6  PLT 341 241 208  May be related to chemotherapy regimen vs involvement of the sigmoid colon as described in CT findings above.  -Repeat CBC in AM  Metastatic Adenocarcinoma: Has poor prognosis.Discussed goals of care with oncologist, but this should be revisited during this hospitalization. -Dr. Julien Nordmann to see in AM  DVT/PE PPx: SCDs  Dispo: Disposition is deferred at this time, awaiting improvement of current medical problems.  Anticipated discharge in approximately 12-3 day(s).   The patient does have a current PCP [name], MD) and does need an [facility] hospital follow-up appointment after discharge.  The patient does not have transportation limitations that hinder transportation to clinic appointments.  .Services Needed at time of discharge: Y = Yes, Blank = No PT:   OT:   RN:   Equipment:   Other:     LOS: 1  day   Corky Sox, MD 05/18/2014, 12:25 PM
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
## 2014-05-18 NOTE — Progress Notes (Addendum)
Gen. surgery:  The patient's numerous family members are now here for further discussion about goals of care. The patient is awake and alert, oriented and has good insight into his decision-making.  I reviewed his history to date, cancer center notes, imaging studies over the past 6 months, and current problem.  His abdomen is tender with guarding diffusely but mostly in the right lower quadrant. Bowel sounds are absent CT scan shows progressive evidence of tumor and some focal contrast extravasation from a loop of small bowel. He does not have a pneumoperitoneum. He does have some ascites. He has localized peritonitis.  He has progressive, metastatic lung cancer and his prognosis is almost certainly limited regardless of intervention. I discussed with the patient and his family the options for treatment at this point in time which include palliative care with comfort measures only, medical treatment for his perforation which includes antibiotics, fluid resuscitation, DVT prophylaxis, and antibiotics. We also extensively discussed surgical intervention and the fact that this is very unlikely to alter his clinical course or quality of life, and quite likely could worsen the quality of his limited life expectancy.  he is not really ready to make a decision. He would like to talk to Dr. Inda Merlin or one of his medical oncology partners about prognosis. Some of his family members advocated for comfort care only.  He does state that he does not remember being told that he had progressive cancer in his abdomen, but does admit that he was told he had "tumors" in his abdomen.  The patient is going to discuss the issues over with his family. I will call medical oncology for their input. Dr. Lindi Adie to see. I agree with Dr. Redmond Pulling that I do not believe that surgical exploration will add to his quality of life.   Edsel Petrin. Dalbert Batman, M.D., [facility] General and Minimally invasive  Surgery Breast and Colorectal Surgery Office:   [phone] Pager:   [phone]

## 2014-05-19 ENCOUNTER — Inpatient Hospital Stay (HOSPITAL_COMMUNITY): Payer: Medicare Other

## 2014-05-19 DIAGNOSIS — C3431 Malignant neoplasm of lower lobe, right bronchus or lung: Secondary | ICD-10-CM

## 2014-05-19 DIAGNOSIS — R188 Other ascites: Secondary | ICD-10-CM

## 2014-05-19 DIAGNOSIS — K550 Acute vascular disorders of intestine: Secondary | ICD-10-CM

## 2014-05-19 DIAGNOSIS — R591 Generalized enlarged lymph nodes: Secondary | ICD-10-CM

## 2014-05-19 DIAGNOSIS — R109 Unspecified abdominal pain: Secondary | ICD-10-CM

## 2014-05-19 DIAGNOSIS — K631 Perforation of intestine (nontraumatic): Principal | ICD-10-CM

## 2014-05-19 DIAGNOSIS — R140 Abdominal distension (gaseous): Secondary | ICD-10-CM

## 2014-05-19 LAB — BASIC METABOLIC PANEL
ANION GAP: 4 — AB (ref 5–15)
BUN: 7 mg/dL (ref 6–23)
CO2: 24 mmol/L (ref 19–32)
CREATININE: 0.85 mg/dL (ref 0.50–1.35)
Calcium: 8.2 mg/dL — ABNORMAL LOW (ref 8.4–10.5)
Chloride: 106 mEq/L (ref 96–112)
GFR calc non Af Amer: >90 mL/min (ref >90)
GLUCOSE: 157 mg/dL — AB (ref 70–99)
Potassium: 3.6 mmol/L (ref 3.5–5.1)
Sodium: 134 mmol/L — ABNORMAL LOW (ref 135–145)

## 2014-05-19 LAB — CBC WITH DIFFERENTIAL/PLATELET
Basophils Absolute: 0 10*3/uL (ref 0.0–0.1)
Basophils Relative: 0 % (ref 0–1)
Eosinophils Absolute: 0.1 10*3/uL (ref 0.0–0.7)
Eosinophils Relative: 1 % (ref 0–5)
HEMATOCRIT: 32.9 % — AB (ref 39.0–52.0)
Hemoglobin: 10 g/dL — ABNORMAL LOW (ref 13.0–17.0)
Lymphocytes Relative: 11 % — ABNORMAL LOW (ref 12–46)
Lymphs Abs: 1.1 10*3/uL (ref 0.7–4.0)
MCH: 26.3 pg (ref 26.0–34.0)
MCHC: 30.4 g/dL (ref 30.0–36.0)
MCV: 86.6 fL (ref 78.0–100.0)
Monocytes Absolute: 1.5 10*3/uL — ABNORMAL HIGH (ref 0.1–1.0)
Monocytes Relative: 15 % — ABNORMAL HIGH (ref 3–12)
NEUTROS ABS: 7.1 10*3/uL (ref 1.7–7.7)
Neutrophils Relative %: 73 % (ref 43–77)
PLATELETS: 239 10*3/uL (ref 150–400)
RBC: 3.8 MIL/uL — ABNORMAL LOW (ref 4.22–5.81)
RDW: 16.5 % — AB (ref 11.5–15.5)
WBC: 9.6 10*3/uL (ref 4.0–10.5)

## 2014-05-19 MED ORDER — MAGNESIUM HYDROXIDE 400 MG/5ML PO SUSP
15.0000 mL | Freq: Every day | ORAL | Status: AC
  Administered 2014-05-19: 15 mL via ORAL
  Filled 2014-05-19: qty 30

## 2014-05-19 MED ORDER — DEXTROSE-NACL 5-0.9 % IV SOLN
INTRAVENOUS | Status: AC
  Administered 2014-05-19 – 2014-05-20 (×2): via INTRAVENOUS

## 2014-05-19 MED ORDER — BISACODYL 10 MG RE SUPP
10.0000 mg | Freq: Once | RECTAL | Status: DC
  Filled 2014-05-19: qty 1

## 2014-05-19 NOTE — Progress Notes (Signed)
Subjective:  Patient seen and examined this am. Patient states abdominal pain is improved and controlled with pain medication. He had a very small bowel movement last night, no blood.   Objective: Vital signs in last 24 hours: Filed Vitals:   05/18/14 2245 05/19/14 0639 05/19/14 1300 05/19/14 1617  BP: 128/85 120/81 140/86   Pulse: 94 93 91   Temp: 100.4 F (38 C) 100.1 F (37.8 C) 99 F (37.2 C)   TempSrc: Oral Oral Oral   Resp: 16 16 16    Height:      Weight:    62.778 kg (138 lb 6.4 oz)  SpO2: 99% 99% 100%    Weight change:   Intake/Output Summary (Last 24 hours) at 05/19/14 1835 Last data filed at 05/19/14 1437  Gross per 24 hour  Intake    823 ml  Output    100 ml  Net    723 ml   Physical Exam: General: Thin appearing male, alert, cooperative, NAD. Chronically ill-appearing.  HEENT: Left eye prosthesis. Right eye normal EOM Lungs: Clear to ascultation bilaterally, no wheezes, rales, rhonchi Cardiac: Tachycardic, regular rhythm, no murmurs, gallops, or rubs Abdomen: Diffuse discomfort on palpation, distended, hypoactive bowel sounds.  Extremities: No lower extremity edema bilaterally  Lab Results: Basic Metabolic Panel:  Recent Labs Lab 05/18/14 0932 05/19/14 0443  NA 133* 134*  K 3.8 3.6  CL 101 106  CO2 22 24  GLUCOSE 111* 157*  BUN 10 7  CREATININE 0.84 0.85  CALCIUM 8.3* 8.2*   Liver Function Tests:  Recent Labs Lab 05/14/14 0920 05/17/14 1715  AST 15 20  ALT 7 14  ALKPHOS 135 134*  BILITOT 0.35 1.0  PROT 7.0 6.8  ALBUMIN 2.6* 2.9*    Recent Labs Lab 05/17/14 1715  LIPASE 24   CBC:  Recent Labs Lab 05/17/14 1715 05/18/14 0932 05/19/14 0443  WBC 9.8 9.6 9.6  NEUTROABS 6.7  --  7.1  HGB 11.7* 10.8* 10.0*  HCT 37.8* 34.3* 32.9*  MCV 88.1 88.4 86.6  PLT 241 208 239   Urinalysis:  Recent Labs Lab 05/17/14 1825  COLORURINE AMBER*  LABSPEC 1.022  PHURINE 5.0  GLUCOSEU NEGATIVE  HGBUR NEGATIVE  BILIRUBINUR SMALL*    KETONESUR NEGATIVE  PROTEINUR NEGATIVE  UROBILINOGEN 1.0  NITRITE NEGATIVE  LEUKOCYTESUR NEGATIVE   Studies/Results: Ct Abdomen Pelvis W Contrast  05/18/2014   CLINICAL DATA:  Acute onset of right lower quadrant abdominal pain for 2 days. Bloody stools and abdominal tenderness.  EXAM: CT ABDOMEN AND PELVIS WITH CONTRAST  TECHNIQUE: Multidetector CT imaging of the abdomen and pelvis was performed using the standard protocol following bolus administration of intravenous contrast.  CONTRAST:  186mL OMNIPAQUE IOHEXOL 300 MG/ML  SOLN  COMPARISON:  CT of the abdomen and pelvis from 04/15/2014  FINDINGS: A small right pleural effusion is again noted. The centrally cavitary mass at the right lower lobe has increased mildly in size, now measuring approximately 4.3 cm, reflecting the patient's known lung adenocarcinoma. A trace left pleural effusion is noted.  There is increased moderate ascites noted about the liver and spleen, tracking along both paracolic gutters into the pelvis. There is mild new peripheral enhancement about the fluid in the pelvis. Though the fluid remains simple in attenuation, its attenuation is slightly higher than on the prior study, raising concern for mild underlying complexity. No focal abscess formation is yet seen.  There is significantly worsened mesenteric edema and diffuse mucosal edema involving much of the  jejunum and proximal ileum. The amount of mesenteric edema and wall thickening raises concern for early changes of bowel ischemia along much of the jejunum and proximal ileum. This corresponds to interval enlargement of the patient's underlying mesenteric masses, measuring up to 2.8 cm in size.  Along the proximal ileum, at the site of the previously noted diffuse tumor infiltration into the ileal wall, there now appears to be oral contrast extending across the wall of the small bowel, though still contained by the serosa. This is compatible with a focal contained perforation.   The clinically described bloody stools likely arise from tumor infiltration at the proximal sigmoid colon, where there is significantly worsened circumferential wall thickening. This is adjacent to the site of ileal infiltration.  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 There is vague hypodensity surrounding the stomach, which could reflect a small amount of underlying tumor and associated fluid.  Wall thickening is noted along the distal esophagus, perhaps mildly more apparent than on the prior study.  Scattered tiny hypodensities within the liver remain nonspecific. The spleen is unremarkable in appearance. The gallbladder is difficult to fully assess due to surrounding fluid, but appears grossly unremarkable. The pancreas and adrenal glands are unremarkable.  Scattered bilateral renal cysts are seen. The kidneys are otherwise unremarkable. Mild nonspecific perinephric stranding is noted bilaterally. There is no evidence of hydronephrosis. No renal or ureteral stones are seen.  Enlarged periaortic nodes measure up to 1.5 cm in short axis. No acute vascular abnormalities are seen. Scattered calcification is seen along the abdominal aorta and its branches.  The appendix is filled with contrast, but difficult to fully assess due to surrounding edema. The remainder of the colon is filled with contrast, to the level of the infiltrating mass at the proximal sigmoid colon, and is grossly unremarkable.  The bladder is mildly distended and grossly unremarkable in appearance. The prostate remains normal in size, with scattered calcification. No inguinal lymphadenopathy is seen.  No acute osseous abnormalities are identified. Vacuum phenomenon is noted at L5-S1.  IMPRESSION: 1. Along the proximal ileum, at the site of previously noted diffuse tumor infiltration into the ileal wall, there is oral contrast extending across the wall of the small bowel, though still contained by the serosa.  This is compatible with a focal contained perforation. 2. Significantly worsened mesenteric edema and diffuse mucosal edema involving much of the jejunum and proximal ileum. This raises question for early changes of bowel ischemia along the jejunum and proximal ileum, in conjunction with interval enlargement of underlying mesenteric masses, measuring up to 2.8 cm in size. 3. Clinically described bloody stools likely arise from tumor infiltration of the proximal sigmoid colon, with significantly worsened circumferential wall thickening. This is adjacent to the ileal infiltration. 4. Increased moderate ascites about the liver and spleen, tracking along both paracolic gutters into the pelvis. Mild new peripheral enhancement about the fluid in the pelvis. Though the fluid remains simple in attenuation, slightly increased attenuation raises concern for mild complexity. No focal abscess yet seen. 5. 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 Vague hypodensity surrounding the stomach could reflect a small amount of underlying tumor and associated fluid. Wall thickening at the distal esophagus is perhaps slightly more apparent than on the prior study. 6. Enlarged periaortic nodes measure up to 1.5 cm in short axis. 7. Small right pleural effusion again noted. Centrally cavitary mass at the right lower lobe has increased mildly in size, now measuring 4.3 cm, reflecting  the patient's known lung adenocarcinoma. Trace left pleural effusion noted. 8. Nonspecific tiny hypodensities within the liver.  These results were called by telephone at the time of interpretation on 05/18/2014 at 5:05 am to Dr. Sherrine Maples, who verbally acknowledged these results.   Electronically Signed   By: Garald Balding M.D.   On: 05/18/2014 05:30   Dg Abd Portable 1v  05/19/2014   CLINICAL DATA:  Abdominal distention with constipation.  EXAM: PORTABLE ABDOMEN - 1 VIEW  COMPARISON:  CT abdomen pelvis 05/18/2014.  FINDINGS: Oral contrast is  seen in the appendix, colon and rectum. No small bowel dilatation. No unexpected radiopaque calculi.  IMPRESSION: Retained oral contrast within the colon and rectum. No acute findings. Please refer to cross-sectional imaging performed yesterday for further details and discussion.   Electronically Signed   By: Lorin Picket M.D.   On: 05/19/2014 15:32   Medications: I have reviewed the patient's current medications. Medications Prior to Admission  Medication Sig Dispense Refill  . dexamethasone (DECADRON) 4 MG tablet 2 tablets by mouth twice a day the day before, day of and day after the chemotherapy every 3 weeks. 40 tablet 1  . lidocaine-prilocaine (EMLA) cream Apply 1 application topically as needed. 30 g 0  . ondansetron (ZOFRAN ODT) 4 MG disintegrating tablet Take 1 tablet (4 mg total) by mouth every 8 (eight) hours as needed for nausea. 10 tablet 0  . ondansetron (ZOFRAN) 8 MG tablet Take 1 tablet (8 mg total) by mouth every 8 (eight) hours as needed for nausea. 20 tablet 3  . PRESCRIPTION MEDICATION Chemo at [facility]    . prochlorperazine (COMPAZINE) 10 MG tablet Take 1 tablet (10 mg total) by mouth every 6 (six) hours as needed. 60 tablet 0  . oxyCODONE-acetaminophen (PERCOCET) 5-325 MG per tablet Take 1-2 tablets by mouth every 6 (six) hours as needed for severe pain. (Patient not taking: Reported on 05/17/2014) 20 tablet 0    Scheduled Meds: . bisacodyl  10 mg Rectal Once  . pantoprazole (PROTONIX) IV  40 mg Intravenous Daily  . piperacillin-tazobactam (ZOSYN)  IV  3.375 g Intravenous Q8H  . sodium chloride  3 mL Intravenous Q12H   Continuous Infusions: . dextrose 5 % and 0.9% NaCl 100 mL/hr at 05/19/14 1833   PRN Meds:.sodium chloride, HYDROmorphone (DILAUDID) injection, ketorolac, sodium chloride   Assessment/Plan: 64 y/o M w/ PMHx of Lung Adenocarcinoma w/ suspected metastases to the abdomen, admitted for severe abdominal pain, thought to have metastatic involvement of the ileum w/  possible microperforation.   Metastatic Adenocarcinoma with Contained Bowel Perforation: CT abdomen/pelvis on admission showed diffuse tumor infiltration of the proximal ileum, along w/ oral contrast extending across the wall of the small bowel, though still contained by the serosa, compatible with a focal contained perforation. 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 Surgery believes patient is a poor candidate for surgery and surgery would not improve his quality of life. Dr. Lindi Adie w/ medical oncology recommended medical management w/ IVF, bowel rest, and ABx is the best option at this time. Dr. Lindi Adie contacted Dr. Julien Nordmann, patient's medical oncologist, who agreed to see th patient tonight. Patient abdominal pain is controlled with current pain regimen, but patient continues to feel distended an has not had a bowel movement. Based on discussion  with Dr. Julien Nordmann, we can explore goals of care tomorrow.  -NPO for now -Continue D5-NS @ 100 cc/hr -Continue Zosyn  -Zofran prn  -Dilaudid 0.5 mg Q4H prn for pain -Toradol 30 mg Q8H prn for pain -Protonix 40 mg IV qd -Milk of Magnesia 15 mL once -Ducolax suppository 10 mg once -Repeat CBC/BMET tomorrow am -Medical Oncology following, appreciate recommendations  GIB: FOBT positive prior to admission. Hgb stable, trend as follows:   Recent Labs Lab 05/17/14 1715 05/18/14 0932 05/19/14 0443  HGB 11.7* 10.8* 10.0*  HCT 37.8* 34.3* 32.9*  WBC 9.8 9.6 9.6  PLT 241 208 239   -Repeat CBC in AM  DVT/PE PPx: SCDs, Hold anticoagulation given recent +FOBT and contained bowel perforation. Patient unfortunately does have evidence of possible bowel ischemia, but given first two issues, we will hold of  on anticoagualtion.   Dispo: Disposition is deferred at this time, awaiting improvement of current medical problems.  Anticipated discharge in approximately 1-2 day(s).   The patient does have a current PCP [name], MD) and does need an [facility] hospital follow-up appointment after discharge.  The patient does not have transportation limitations that hinder transportation to clinic appointments.  .Services Needed at time of discharge: Y = Yes, Blank = No PT:   OT:   RN:   Equipment:   Other:     LOS: 2 days   Osa Craver, DO PGY-1 Internal Medicine Resident Pager # [phone] 05/19/2014 6:35 PM

## 2014-05-19 NOTE — Progress Notes (Signed)
DIAGNOSIS: Metastatic non-small cell lung cancer initially diagnosed as Stage IIIA (T3, N2, MX)/IV non-small cell lung cancer consistent with adenocarcinoma diagnosed in August 2014.  PRIOR THERAPY:  1) Neoadjuvant chemotherapy with carboplatin for an AUC of 5 and Alimta at 500 mg per meter squared given every 3 weeks for a total of 6 cycles, with stable disease.  2) Concurrent chemoradiation with weekly carboplatin for AUC of 2 and paclitaxel 45 mg/M2 status post 6 cycles. 3) Systemic chemotherapy with docetaxel 75 mg/M2 and Cyramza 10 mg/KG every 3 weeks with Neulasta support. First dose 01/09/2014.Status post 3 cycles. Docetaxel was discontinued secondary to intolerance.  CURRENT THERAPY: Systemic chemotherapy with Cyramza 10 mg/KG every 3 weeks. First dose 01/09/2014.Status post 3 cycles. This treatment is currently on hold.  CHEMOTHERAPY INTENT: Palliative.  CURRENT # OF CHEMOTHERAPY CYCLES: 3 CURRENT ANTIEMETICS: Zofran, dexamethasone and Compazine  CURRENT SMOKING STATUS: Former smoker  ORAL CHEMOTHERAPY AND CONSENT: None  CURRENT BISPHOSPHONATES USE: None  PAIN MANAGEMENT: 0/10  NARCOTICS INDUCED CONSTIPATION: None  LIVING WILL AND CODE STATUS: Full code  Subjective: The patient is seen and examined today. His wife and daughter were at the bedside. This is a very pleasant 64 years old African-American male who was diagnosed with a stage IV non-small cell lung cancer in August 2014 is status post several chemotherapy regimens and most recently treated with single agent Cyramza which is VGEFR inhibitor. He has been tolerating his treatment fairly well and the CT scan of the abdomen and pelvis on 04/15/2014 showed no evidence for disease progression in the abdomen. Also repeat CT scan of the chest on 05/07/2014 showed mixed response with no evidence for disease progression in the abdomen but slightly increased size of a pulmonary nodule in the left upper lobe. The patient was seen  last week and he complained of fatigue and his hemoglobin was low. He received 2 units of PRBCs transfusion last week. We ordered stool for Hemoccult that was positive on 05/16/2014. Referral was made to gastroenterology but the patient was admitted on 05/17/2014 with abdominal distention and diffuse abdominal pain as well as decreased by mouth intake.  Repeat CT scan of the abdomen and pelvis on 05/18/2014 showed findings concerning for focal contained perforation with significant worsening of mesenteric edema and diffuse mucosal edema involving much of the jejunum and proximal ileum raising question of early changes of bowel ischemia along the jejunum and proximal ileum in conjunction with interval enlargement of underlying mesenteric masses. There was also increased moderate ascites about the liver and the spleen and diffuse omental edema concerning for omental metastasis. He was seen by surgery and surgical intervention was not an option. The patient was initially on denial about involvement of his abdomen with the lung cancer but his wife who was at the bedside reminded him that this was discussed several time in the past during his office visit and that was the reason for changing his treatment few times. He still have significant abdominal distention. He denied having any significant nausea or vomiting, he has no fever or chills.   Objective: Vital signs in last 24 hours: Temp:  [99 F (37.2 C)-100.4 F (38 C)] 99 F (37.2 C) (01/18 1300) Pulse Rate:  [91-94] 91 (01/18 1300) Resp:  [16] 16 (01/18 1300) BP: (120-140)/(81-86) 140/86 mmHg (01/18 1300) SpO2:  [99 %-100 %] 100 % (01/18 1300) Weight:  [138 lb 6.4 oz (62.778 kg)] 138 lb 6.4 oz (62.778 kg) (01/18 1617)  Intake/Output from previous day: 01/17  0701 - 01/18 0700 In: 0  Out: 200 [Urine:200] Intake/Output this shift:    General appearance: alert, cooperative, fatigued and no distress Resp: clear to auscultation bilaterally Cardio:  regular rate and rhythm, S1, S2 normal, no murmur, click, rub or gallop GI: Moderately distended and mildly tender to palpation. Extremities: extremities normal, atraumatic, no cyanosis or edema  Lab Results:   Recent Labs  05/18/14 0932 05/19/14 0443  WBC 9.6 9.6  HGB 10.8* 10.0*  HCT 34.3* 32.9*  PLT 208 239   BMET  Recent Labs  05/18/14 0932 05/19/14 0443  NA 133* 134*  K 3.8 3.6  CL 101 106  CO2 22 24  GLUCOSE 111* 157*  BUN 10 7  CREATININE 0.84 0.85  CALCIUM 8.3* 8.2*    Studies/Results: Ct Abdomen Pelvis W Contrast  05/18/2014   CLINICAL DATA:  Acute onset of right lower quadrant abdominal pain for 2 days. Bloody stools and abdominal tenderness.  EXAM: CT ABDOMEN AND PELVIS WITH CONTRAST  TECHNIQUE: Multidetector CT imaging of the abdomen and pelvis was performed using the standard protocol following bolus administration of intravenous contrast.  CONTRAST:  155mL OMNIPAQUE IOHEXOL 300 MG/ML  SOLN  COMPARISON:  CT of the abdomen and pelvis from 04/15/2014  FINDINGS: A small right pleural effusion is again noted. The centrally cavitary mass at the right lower lobe has increased mildly in size, now measuring approximately 4.3 cm, reflecting the patient's known lung adenocarcinoma. A trace left pleural effusion is noted.  There is increased moderate ascites noted about the liver and spleen, tracking along both paracolic gutters into the pelvis. There is mild new peripheral enhancement about the fluid in the pelvis. Though the fluid remains simple in attenuation, its attenuation is slightly higher than on the prior study, raising concern for mild underlying complexity. No focal abscess formation is yet seen.  There is significantly worsened mesenteric edema and diffuse mucosal edema involving much of the jejunum and proximal ileum. The amount of mesenteric edema and wall thickening raises concern for early changes of bowel ischemia along much of the jejunum and proximal ileum. This  corresponds to interval enlargement of the patient's underlying mesenteric masses, measuring up to 2.8 cm in size.  Along the proximal ileum, at the site of the previously noted diffuse tumor infiltration into the ileal wall, there now appears to be oral contrast extending across the wall of the small bowel, though still contained by the serosa. This is compatible with a focal contained perforation.  The clinically described bloody stools likely arise from tumor infiltration at the proximal sigmoid colon, where there is significantly worsened circumferential wall thickening. This is adjacent to the site of ileal infiltration.  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 There is vague hypodensity surrounding the stomach, which could reflect a small amount of underlying tumor and associated fluid.  Wall thickening is noted along the distal esophagus, perhaps mildly more apparent than on the prior study.  Scattered tiny hypodensities within the liver remain nonspecific. The spleen is unremarkable in appearance. The gallbladder is difficult to fully assess due to surrounding fluid, but appears grossly unremarkable. The pancreas and adrenal glands are unremarkable.  Scattered bilateral renal cysts are seen. The kidneys are otherwise unremarkable. Mild nonspecific perinephric stranding is noted bilaterally. There is no evidence of hydronephrosis. No renal or ureteral stones are seen.  Enlarged periaortic nodes measure up to 1.5 cm in short axis. No acute vascular abnormalities are seen. Scattered calcification is seen  along the abdominal aorta and its branches.  The appendix is filled with contrast, but difficult to fully assess due to surrounding edema. The remainder of the colon is filled with contrast, to the level of the infiltrating mass at the proximal sigmoid colon, and is grossly unremarkable.  The bladder is mildly distended and grossly unremarkable in appearance. The prostate  remains normal in size, with scattered calcification. No inguinal lymphadenopathy is seen.  No acute osseous abnormalities are identified. Vacuum phenomenon is noted at L5-S1.  IMPRESSION: 1. Along the proximal ileum, at the site of previously noted diffuse tumor infiltration into the ileal wall, there is oral contrast extending across the wall of the small bowel, though still contained by the serosa. This is compatible with a focal contained perforation. 2. Significantly worsened mesenteric edema and diffuse mucosal edema involving much of the jejunum and proximal ileum. This raises question for early changes of bowel ischemia along the jejunum and proximal ileum, in conjunction with interval enlargement of underlying mesenteric masses, measuring up to 2.8 cm in size. 3. Clinically described bloody stools likely arise from tumor infiltration of the proximal sigmoid colon, with significantly worsened circumferential wall thickening. This is adjacent to the ileal infiltration. 4. Increased moderate ascites about the liver and spleen, tracking along both paracolic gutters into the pelvis. Mild new peripheral enhancement about the fluid in the pelvis. Though the fluid remains simple in attenuation, slightly increased attenuation raises concern for mild complexity. No focal abscess yet seen. 5. 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 Vague hypodensity surrounding the stomach could reflect a small amount of underlying tumor and associated fluid. Wall thickening at the distal esophagus is perhaps slightly more apparent than on the prior study. 6. Enlarged periaortic nodes measure up to 1.5 cm in short axis. 7. Small right pleural effusion again noted. Centrally cavitary mass at the right lower lobe has increased mildly in size, now measuring 4.3 cm, reflecting the patient's known lung adenocarcinoma. Trace left pleural effusion noted. 8. Nonspecific tiny hypodensities within the liver.  These results were  called by telephone at the time of interpretation on 05/18/2014 at 5:05 am to Dr. Sherrine Maples, who verbally acknowledged these results.   Electronically Signed   By: Garald Balding M.D.   On: 05/18/2014 05:30   Dg Abd Portable 1v  05/19/2014   CLINICAL DATA:  Abdominal distention with constipation.  EXAM: PORTABLE ABDOMEN - 1 VIEW  COMPARISON:  CT abdomen pelvis 05/18/2014.  FINDINGS: Oral contrast is seen in the appendix, colon and rectum. No small bowel dilatation. No unexpected radiopaque calculi.  IMPRESSION: Retained oral contrast within the colon and rectum. No acute findings. Please refer to cross-sectional imaging performed yesterday for further details and discussion.   Electronically Signed   By: Lorin Picket M.D.   On: 05/19/2014 15:32    Medications: I have reviewed the patient's current medications.   Assessment/Plan: 1) metastatic non-small cell lung cancer, adenocarcinoma status post several chemotherapy regimens, most recently treated with single agent Cyramza with no significant evidence for disease progression until the recent CT scan of the chest performed on 05/07/2014 except for mildly enlarged left upper lobe nodule. The abdominal lymph node has been stable over the last few scans at the recent CT scan of the abdomen showed questionable omental involvement and enlarged lymphadenopathy. This could be secondary to disease progression but inflammatory process cannot be completely excluded at this point. His treatment will be currently on hold until he completely recovers from the  current condition. Palliative care and hospice are also reasonable option for this patient who has done extremely well for around 17 months with a stage IV lung cancer. I discussed my recommendation with the patient and his family and explained to them his poor prognosis. 2) questionable small bowel perforation, currently focally contained. His bowel ischemia could be secondary to the disease progression versus  drug induced from [place] which is anti-angiogenesis agent.  The patient is not a candidate for surgical intervention. Continue with the current conservative approach. I had a lengthy discussion with the patient and his family about his general disease status and current condition. I explained to the patient that we will try our best to improve his condition but he and his family has to be prepared for a worse outcome. Thank you so much for taking good care of Mr. Duhe. I will continue to follow up the patient with you and assist in his management on as-needed basis.   LOS: 2 days    Tyneisha Hegeman K. 05/19/2014

## 2014-05-19 NOTE — Progress Notes (Addendum)
Subjective: Feels bloated. A little less pain today.   Objective: Vital signs in last 24 hours: Temp:  [98.6 F (37 C)-100.4 F (38 C)] 100.1 F (37.8 C) (01/18 0639) Pulse Rate:  [93-102] 93 (01/18 0639) Resp:  [16-17] 16 (01/18 0639) BP: (120-131)/(81-85) 120/81 mmHg (01/18 0639) SpO2:  [99 %-100 %] 99 % (01/18 0639) Last BM Date: 05/16/14  Intake/Output from previous day: 01/17 0701 - 01/18 0700 In: 0  Out: 200 [Urine:200] Intake/Output this shift:    Alert, nontoxic Soft, distended. Some TTP b/l lower quadrant (R>L). No peritonitis. (less tender today)  Lab Results:   Recent Labs  05/18/14 0932 05/19/14 0443  WBC 9.6 9.6  HGB 10.8* 10.0*  HCT 34.3* 32.9*  PLT 208 239   BMET  Recent Labs  05/18/14 0932 05/19/14 0443  NA 133* 134*  K 3.8 3.6  CL 101 106  CO2 22 24  GLUCOSE 111* 157*  BUN 10 7  CREATININE 0.84 0.85  CALCIUM 8.3* 8.2*   PT/INR No results for input(s): LABPROT, INR in the last 72 hours. ABG No results for input(s): PHART, HCO3 in the last 72 hours.  Invalid input(s): PCO2, PO2  Studies/Results: Ct Abdomen Pelvis W Contrast  05/18/2014   CLINICAL DATA:  Acute onset of right lower quadrant abdominal pain for 2 days. Bloody stools and abdominal tenderness.  EXAM: CT ABDOMEN AND PELVIS WITH CONTRAST  TECHNIQUE: Multidetector CT imaging of the abdomen and pelvis was performed using the standard protocol following bolus administration of intravenous contrast.  CONTRAST:  163mL OMNIPAQUE IOHEXOL 300 MG/ML  SOLN  COMPARISON:  CT of the abdomen and pelvis from 04/15/2014  FINDINGS: A small right pleural effusion is again noted. The centrally cavitary mass at the right lower lobe has increased mildly in size, now measuring approximately 4.3 cm, reflecting the patient's known lung adenocarcinoma. A trace left pleural effusion is noted.  There is increased moderate ascites noted about the liver and spleen, tracking along both paracolic gutters into  the pelvis. There is mild new peripheral enhancement about the fluid in the pelvis. Though the fluid remains simple in attenuation, its attenuation is slightly higher than on the prior study, raising concern for mild underlying complexity. No focal abscess formation is yet seen.  There is significantly worsened mesenteric edema and diffuse mucosal edema involving much of the jejunum and proximal ileum. The amount of mesenteric edema and wall thickening raises concern for early changes of bowel ischemia along much of the jejunum and proximal ileum. This corresponds to interval enlargement of the patient's underlying mesenteric masses, measuring up to 2.8 cm in size.  Along the proximal ileum, at the site of the previously noted diffuse tumor infiltration into the ileal wall, there now appears to be oral contrast extending across the wall of the small bowel, though still contained by the serosa. This is compatible with a focal contained perforation.  The clinically described bloody stools likely arise from tumor infiltration at the proximal sigmoid colon, where there is significantly worsened circumferential wall thickening. This is adjacent to the site of ileal infiltration.  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 There is vague hypodensity surrounding the stomach, which could reflect a small amount of underlying tumor and associated fluid.  Wall thickening is noted along the distal esophagus, perhaps mildly more apparent than on the prior study.  Scattered tiny hypodensities within the liver remain nonspecific. The spleen is unremarkable in appearance. The gallbladder is difficult to fully  assess due to surrounding fluid, but appears grossly unremarkable. The pancreas and adrenal glands are unremarkable.  Scattered bilateral renal cysts are seen. The kidneys are otherwise unremarkable. Mild nonspecific perinephric stranding is noted bilaterally. There is no evidence of  hydronephrosis. No renal or ureteral stones are seen.  Enlarged periaortic nodes measure up to 1.5 cm in short axis. No acute vascular abnormalities are seen. Scattered calcification is seen along the abdominal aorta and its branches.  The appendix is filled with contrast, but difficult to fully assess due to surrounding edema. The remainder of the colon is filled with contrast, to the level of the infiltrating mass at the proximal sigmoid colon, and is grossly unremarkable.  The bladder is mildly distended and grossly unremarkable in appearance. The prostate remains normal in size, with scattered calcification. No inguinal lymphadenopathy is seen.  No acute osseous abnormalities are identified. Vacuum phenomenon is noted at L5-S1.  IMPRESSION: 1. Along the proximal ileum, at the site of previously noted diffuse tumor infiltration into the ileal wall, there is oral contrast extending across the wall of the small bowel, though still contained by the serosa. This is compatible with a focal contained perforation. 2. Significantly worsened mesenteric edema and diffuse mucosal edema involving much of the jejunum and proximal ileum. This raises question for early changes of bowel ischemia along the jejunum and proximal ileum, in conjunction with interval enlargement of underlying mesenteric masses, measuring up to 2.8 cm in size. 3. Clinically described bloody stools likely arise from tumor infiltration of the proximal sigmoid colon, with significantly worsened circumferential wall thickening. This is adjacent to the ileal infiltration. 4. Increased moderate ascites about the liver and spleen, tracking along both paracolic gutters into the pelvis. Mild new peripheral enhancement about the fluid in the pelvis. Though the fluid remains simple in attenuation, slightly increased attenuation raises concern for mild complexity. No focal abscess yet seen. 5. 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 Vague  hypodensity surrounding the stomach could reflect a small amount of underlying tumor and associated fluid. Wall thickening at the distal esophagus is perhaps slightly more apparent than on the prior study. 6. Enlarged periaortic nodes measure up to 1.5 cm in short axis. 7. Small right pleural effusion again noted. Centrally cavitary mass at the right lower lobe has increased mildly in size, now measuring 4.3 cm, reflecting the patient's known lung adenocarcinoma. Trace left pleural effusion noted. 8. Nonspecific tiny hypodensities within the liver.  These results were called by telephone at the time of interpretation on 05/18/2014 at 5:05 am to Dr. Sherrine Maples, who verbally acknowledged these results.   Electronically Signed   By: Garald Balding M.D.   On: 05/18/2014 05:30    Anti-infectives: Anti-infectives    Start     Dose/Rate Route Frequency Ordered Stop   05/18/14 1200  piperacillin-tazobactam (ZOSYN) IVPB 3.375 g     3.375 g12.5 mL/hr over 240 Minutes Intravenous Every 8 hours 05/18/14 0514     05/18/14 0515  piperacillin-tazobactam (ZOSYN) IVPB 3.375 g     3.375 g100 mL/hr over 30 Minutes Intravenous  Once 05/18/14 0514 05/18/14 3382      Assessment/Plan: Metastatic non-small cell lung cancer Anemia of chronic disease Mild protein calorie malnutrition Contained perforation of ileum with localized peritonitis Progression of metastatic disease within the abdomen Ascites Probable tumor infiltration of sigmoid colon  Can try mild laxative like milk of magnesia Can cont abx/bowel rest  Nothing has really changed from my point of view - no  surgery really indicated - i don't think abdominal surgery would add any quality to his life and in fact would worsen his quality of life. No surgical cure for what is going on in his abdomen.   Leighton Ruff. Redmond Pulling, MD, FACS FASMBS General, Bariatric, & Minimally Invasive Surgery [facility]   LOS: 2 days    [name] 05/19/2014

## 2014-05-19 NOTE — Progress Notes (Signed)
Pt seen and examined with Dr. Marvel Plan.   Pt states abd pain is a little better. No fevers. Still with abd distension but mildly improved.  Physical Exam: Gen: AAO*3, NAD CVS: RRR, normal heart sounds Pulm: CTA b/l Abd: soft, mild diffuse tenderness +, distended, hypoactive BS + Ext: no edema  Assessment and Plan: 64 y/o male with hx of lung adenoCa with abd mets p/w abd pain and found to have contained perforation in proximal ileum with worsening mets  Abd pain with perforation/bowel ischemia: - surgery f/u noted. No surgical intervention is warranted at this time per surgery given likelihood of worsening of quality of life - Pt with constipation- would give milk of magnesium per surgery - Keep NPO for now - c/w IVF - c/w zosyn and pain control - c/w PPI  Metastatic adenocarcinoma: - Dr. Earlie Server to see patient today and discuss goals of care - Pt is a good candidate for home hospice

## 2014-05-20 LAB — CBC
HCT: 29.2 % — ABNORMAL LOW (ref 39.0–52.0)
Hemoglobin: 8.8 g/dL — ABNORMAL LOW (ref 13.0–17.0)
MCH: 26.5 pg (ref 26.0–34.0)
MCHC: 30.1 g/dL (ref 30.0–36.0)
MCV: 88 fL (ref 78.0–100.0)
PLATELETS: 267 10*3/uL (ref 150–400)
RBC: 3.32 MIL/uL — ABNORMAL LOW (ref 4.22–5.81)
RDW: 16.4 % — ABNORMAL HIGH (ref 11.5–15.5)
WBC: 7.6 10*3/uL (ref 4.0–10.5)

## 2014-05-20 LAB — BASIC METABOLIC PANEL
Anion gap: 3 — ABNORMAL LOW (ref 5–15)
BUN: 6 mg/dL (ref 6–23)
CALCIUM: 8.3 mg/dL — AB (ref 8.4–10.5)
CHLORIDE: 114 meq/L — AB (ref 96–112)
CO2: 23 mmol/L (ref 19–32)
Creatinine, Ser: 0.94 mg/dL (ref 0.50–1.35)
GFR calc Af Amer: >90 mL/min (ref >90)
GFR calc non Af Amer: 88 mL/min — ABNORMAL LOW (ref >90)
GLUCOSE: 120 mg/dL — AB (ref 70–99)
Potassium: 3.6 mmol/L (ref 3.5–5.1)
SODIUM: 140 mmol/L (ref 135–145)

## 2014-05-20 MED ORDER — DEXTROSE-NACL 5-0.9 % IV SOLN
INTRAVENOUS | Status: AC
  Administered 2014-05-20 – 2014-05-21 (×2): via INTRAVENOUS

## 2014-05-20 NOTE — Progress Notes (Signed)
Pt seen and examined with Dr. Marvel Plan.   Pt states abd pain is improving. He did have a BM last night and one this AM. No fevers   Physical Exam: Gen: AAO*3, NAD CVS: RRR, normal heart sounds Pulm: CTA b/l Abd: soft, mild diffuse tenderness +, distended, hypoactive BS + Ext: no edema  Assessment and Plan: 64 y/o male with hx of lung adenoCa with abd mets p/w abd pain and found to have contained perforation in proximal ileum with worsening mets  Abd pain with perforation/bowel ischemia: - surgery f/u noted. No surgical intervention is warranted at this time - Would try clear liquid diet today. If recurrent pain would resume NPO - c/w IVF - c/w zosyn and pain control - c/w PPI  Metastatic adenocarcinoma: - Oncology f/u noted. Poor prognosis explained to family by onc - Chemotherapy on hold for now - Would get palliative care consult. Pt will likely benefit from home hospice - Case d/w pt and family who would like patient to go home soon and state their main objective is to keep him comfortable

## 2014-05-20 NOTE — Care Management Note (Signed)
An Important Message From Medicare About Patient's Rights given . Magdalen Spatz RN BSN

## 2014-05-20 NOTE — Progress Notes (Signed)
Subjective:  Patient seen and examined this am. Patient had a small bowel movement last night and again this morning. Pain is well controlled. He denies fever, nausea or vomiting. We discussed the conversation that Dr. Julien Nordmann has with the patient and wife last night. Patient seems to understand prognosis. It is important to him that he gets to spend time with his family at home.   Objective: Vital signs in last 24 hours: Filed Vitals:   05/19/14 1617 05/19/14 2148 05/20/14 0536 05/20/14 1420  BP:  125/84 121/82 131/98  Pulse:  86 86 84  Temp:  98.4 F (36.9 C) 99.6 F (37.6 C) 98.3 F (36.8 C)  TempSrc:  Oral Oral Oral  Resp:  17 18 16   Height:      Weight: 62.778 kg (138 lb 6.4 oz)  65.091 kg (143 lb 8 oz)   SpO2:  100% 100% 100%   Weight change:   Intake/Output Summary (Last 24 hours) at 05/20/14 1600 Last data filed at 05/20/14 1300  Gross per 24 hour  Intake   1845 ml  Output    450 ml  Net   1395 ml   Physical Exam: General: Thin appearing male, alert, cooperative, NAD. Chronically ill-appearing.  HEENT: Left eye prosthesis. Right eye normal EOM Lungs: Clear to ascultation bilaterally, no wheezes, rales, rhonchi Cardiac: Regular rate, regular rhythm, no murmurs, gallops, or rubs Abdomen: Nontender, distended, hypoactive bowel sounds.  Extremities: No lower extremity edema bilaterally  Lab Results: Basic Metabolic Panel:  Recent Labs Lab 05/19/14 0443 05/20/14 0414  NA 134* 140  K 3.6 3.6  CL 106 114*  CO2 24 23  GLUCOSE 157* 120*  BUN 7 6  CREATININE 0.85 0.94  CALCIUM 8.2* 8.3*   Liver Function Tests:  Recent Labs Lab 05/14/14 0920 05/17/14 1715  AST 15 20  ALT 7 14  ALKPHOS 135 134*  BILITOT 0.35 1.0  PROT 7.0 6.8  ALBUMIN 2.6* 2.9*    Recent Labs Lab 05/17/14 1715  LIPASE 24   CBC:  Recent Labs Lab 05/17/14 1715  05/19/14 0443 05/20/14 0414  WBC 9.8  < > 9.6 7.6  NEUTROABS 6.7  --  7.1  --   HGB 11.7*  < > 10.0* 8.8*  HCT  37.8*  < > 32.9* 29.2*  MCV 88.1  < > 86.6 88.0  PLT 241  < > 239 267  < > = values in this interval not displayed. Urinalysis:  Recent Labs Lab 05/17/14 1825  COLORURINE AMBER*  LABSPEC 1.022  PHURINE 5.0  GLUCOSEU NEGATIVE  HGBUR NEGATIVE  BILIRUBINUR SMALL*  KETONESUR NEGATIVE  PROTEINUR NEGATIVE  UROBILINOGEN 1.0  NITRITE NEGATIVE  LEUKOCYTESUR NEGATIVE   Studies/Results: Dg Abd Portable 1v  05/19/2014   CLINICAL DATA:  Abdominal distention with constipation.  EXAM: PORTABLE ABDOMEN - 1 VIEW  COMPARISON:  CT abdomen pelvis 05/18/2014.  FINDINGS: Oral contrast is seen in the appendix, colon and rectum. No small bowel dilatation. No unexpected radiopaque calculi.  IMPRESSION: Retained oral contrast within the colon and rectum. No acute findings. Please refer to cross-sectional imaging performed yesterday for further details and discussion.   Electronically Signed   By: Lorin Picket M.D.   On: 05/19/2014 15:32   Medications: I have reviewed the patient's current medications. Medications Prior to Admission  Medication Sig Dispense Refill  . dexamethasone (DECADRON) 4 MG tablet 2 tablets by mouth twice a day the day before, day of and day after the chemotherapy every 3  weeks. 40 tablet 1  . lidocaine-prilocaine (EMLA) cream Apply 1 application topically as needed. 30 g 0  . ondansetron (ZOFRAN ODT) 4 MG disintegrating tablet Take 1 tablet (4 mg total) by mouth every 8 (eight) hours as needed for nausea. 10 tablet 0  . ondansetron (ZOFRAN) 8 MG tablet Take 1 tablet (8 mg total) by mouth every 8 (eight) hours as needed for nausea. 20 tablet 3  . PRESCRIPTION MEDICATION Chemo at [facility]    . prochlorperazine (COMPAZINE) 10 MG tablet Take 1 tablet (10 mg total) by mouth every 6 (six) hours as needed. 60 tablet 0  . oxyCODONE-acetaminophen (PERCOCET) 5-325 MG per tablet Take 1-2 tablets by mouth every 6 (six) hours as needed for severe pain. (Patient not taking: Reported on 05/17/2014) 20  tablet 0    Scheduled Meds: . bisacodyl  10 mg Rectal Once  . pantoprazole (PROTONIX) IV  40 mg Intravenous Daily  . piperacillin-tazobactam (ZOSYN)  IV  3.375 g Intravenous Q8H  . sodium chloride  3 mL Intravenous Q12H   Continuous Infusions: . 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 Pain is currently controlled on current regimen and patient has not been requiring much. Patient seen by surgery who recommends trial of clear liquid diet, but to stop if patient develops N/V or increased pain. We will continue IVF and ABx. Dr. Julien Nordmann saw our patient yesterday and discussed results and prognosis. He believes palliative care and hospice are reasonable at this point. His palliative chemo is on hold for now until resolution of current issues. We have consulted palliative care for their assistance in goals of care discussion and symptom management.  -Clear Liquid Diet -Continue D5-NS @ 75 cc/hr -Continue Zosyn Q8H -Dilaudid 0.5 mg Q4H prn for pain -Toradol 30 mg Q8H prn for pain -Protonix 40 mg IV qd -Repeat CBC/BMET tomorrow am -Medical Oncology following, appreciate recommendations -Surgery following, appreciate recommendations -Palliative care following, appreciate recommendations  GIB: FOBT positive prior to admission. Hgb stable, trend as follows:   Recent Labs Lab 05/18/14 0932 05/19/14 0443 05/20/14 0414  HGB 10.8* 10.0* 8.8*  HCT 34.3* 32.9* 29.2*  WBC 9.6 9.6 7.6  PLT 208 239 267   -Repeat CBC in  AM  DVT/PE PPx: SCDs, Hold anticoagulation given recent +FOBT and contained bowel perforation. Patient unfortunately does have evidence of possible bowel ischemia, but given first two issues, we will hold of on anticoagualtion.   Dispo: Disposition is deferred at this time, awaiting improvement of current medical problems.  Anticipated discharge in approximately 1-2 day(s).   The patient does have a current PCP [name], MD) and does need an [facility] hospital follow-up appointment after discharge.  The patient does not have transportation limitations that hinder transportation to clinic appointments.  .Services Needed at time of discharge: Y = Yes, Blank = No PT:   OT:   RN:   Equipment:   Other:     LOS: 3 days   Osa Craver, DO PGY-1 Internal Medicine Resident Pager # [phone] 05/20/2014 4:00 PM

## 2014-05-20 NOTE — Progress Notes (Signed)
  Subjective: No n/v. Less pain. Some flatus  Objective: Vital signs in last 24 hours: Temp:  [98.4 F (36.9 C)-99.6 F (37.6 C)] 99.6 F (37.6 C) (01/19 0536) Pulse Rate:  [86-91] 86 (01/19 0536) Resp:  [16-18] 18 (01/19 0536) BP: (121-140)/(82-86) 121/82 mmHg (01/19 0536) SpO2:  [100 %] 100 % (01/19 0536) Weight:  [138 lb 6.4 oz (62.778 kg)-143 lb 8 oz (65.091 kg)] 143 lb 8 oz (65.091 kg) (01/19 0536) Last BM Date: 05/20/14  Intake/Output from previous day: 01/18 0701 - 01/19 0700 In: 2068 [I.V.:2000; IV Piggyback:68] Out: 100 [Urine:100] Intake/Output this shift:    Alert, nontoxic Soft, some distension. Min TTP in b/l lower abd  Lab Results:   Recent Labs  05/19/14 0443 05/20/14 0414  WBC 9.6 7.6  HGB 10.0* 8.8*  HCT 32.9* 29.2*  PLT 239 267   BMET  Recent Labs  05/19/14 0443 05/20/14 0414  NA 134* 140  K 3.6 3.6  CL 106 114*  CO2 24 23  GLUCOSE 157* 120*  BUN 7 6  CREATININE 0.85 0.94  CALCIUM 8.2* 8.3*   PT/INR No results for input(s): LABPROT, INR in the last 72 hours. ABG No results for input(s): PHART, HCO3 in the last 72 hours.  Invalid input(s): PCO2, PO2  Studies/Results: Dg Abd Portable 1v  05/19/2014   CLINICAL DATA:  Abdominal distention with constipation.  EXAM: PORTABLE ABDOMEN - 1 VIEW  COMPARISON:  CT abdomen pelvis 05/18/2014.  FINDINGS: Oral contrast is seen in the appendix, colon and rectum. No small bowel dilatation. No unexpected radiopaque calculi.  IMPRESSION: Retained oral contrast within the colon and rectum. No acute findings. Please refer to cross-sectional imaging performed yesterday for further details and discussion.   Electronically Signed   By: Lorin Picket M.D.   On: 05/19/2014 15:32    Anti-infectives: Anti-infectives    Start     Dose/Rate Route Frequency Ordered Stop   05/18/14 1200  piperacillin-tazobactam (ZOSYN) IVPB 3.375 g     3.375 g12.5 mL/hr over 240 Minutes Intravenous Every 8 hours 05/18/14 0514      05/18/14 0515  piperacillin-tazobactam (ZOSYN) IVPB 3.375 g     3.375 g100 mL/hr over 30 Minutes Intravenous  Once 05/18/14 0514 05/18/14 1610      Assessment/Plan: Metastatic non-small cell lung cancer Anemia of chronic disease Mild protein calorie malnutrition Contained perforation of ileum with localized peritonitis Progression of metastatic disease within the abdomen Ascites Probable tumor infiltration of sigmoid colon  No fever. No wbc. Less pain on exam Had discussion with pt and wife. Again - no surgical option which they understand.  i think he can try clear liquids. Advised him that if he had worsening pain, n/v - not to eat Consider palliative care consult  Raymond Gregory. Redmond Pulling, MD, FACS General, Bariatric, & Minimally Invasive Surgery [facility] Surgery, [place]   LOS: 3 days    Raymond Gregory 05/20/2014

## 2014-05-21 ENCOUNTER — Encounter (HOSPITAL_COMMUNITY): Payer: Self-pay | Admitting: Radiology

## 2014-05-21 ENCOUNTER — Inpatient Hospital Stay (HOSPITAL_COMMUNITY): Payer: Medicare Other

## 2014-05-21 LAB — BASIC METABOLIC PANEL
Anion gap: 9 (ref 5–15)
CHLORIDE: 112 meq/L (ref 96–112)
CO2: 20 mmol/L (ref 19–32)
CREATININE: 0.71 mg/dL (ref 0.50–1.35)
Calcium: 8.2 mg/dL — ABNORMAL LOW (ref 8.4–10.5)
GFR calc Af Amer: >90 mL/min (ref >90)
GFR calc non Af Amer: >90 mL/min (ref >90)
Glucose, Bld: 103 mg/dL — ABNORMAL HIGH (ref 70–99)
Potassium: 3.6 mmol/L (ref 3.5–5.1)
Sodium: 141 mmol/L (ref 135–145)

## 2014-05-21 LAB — CBC
HCT: 30.4 % — ABNORMAL LOW (ref 39.0–52.0)
HEMOGLOBIN: 9.2 g/dL — AB (ref 13.0–17.0)
MCH: 26.4 pg (ref 26.0–34.0)
MCHC: 30.3 g/dL (ref 30.0–36.0)
MCV: 87.4 fL (ref 78.0–100.0)
Platelets: 262 10*3/uL (ref 150–400)
RBC: 3.48 MIL/uL — ABNORMAL LOW (ref 4.22–5.81)
RDW: 16.6 % — ABNORMAL HIGH (ref 11.5–15.5)
WBC: 7.7 10*3/uL (ref 4.0–10.5)

## 2014-05-21 MED ORDER — IOHEXOL 300 MG/ML  SOLN
25.0000 mL | INTRAMUSCULAR | Status: AC
  Administered 2014-05-21 (×2): 25 mL via ORAL

## 2014-05-21 MED ORDER — IOHEXOL 300 MG/ML  SOLN
80.0000 mL | Freq: Once | INTRAMUSCULAR | Status: AC | PRN
  Administered 2014-05-21: 1 mL via INTRAVENOUS

## 2014-05-21 NOTE — Progress Notes (Signed)
Subjective:  Patient seen and examined this am. Patient tolerated clear liquid diet for dinner without any nausea or vomiting. He mentioned increased abdominal pain later in the evening, but overall abdominal pain has improved from prior with passage of flatus and with bowel movements. Patient had another bowel movement yesterday. He denies any fever or chills.   Objective: Vital signs in last 24 hours: Filed Vitals:   05/20/14 0536 05/20/14 1420 05/20/14 2147 05/21/14 0618  BP: 121/82 131/98 135/82 128/89  Pulse: 86 84 91 87  Temp: 99.6 F (37.6 C) 98.3 F (36.8 C) 99.7 F (37.6 C) 98.3 F (36.8 C)  TempSrc: Oral Oral Oral   Resp: 18 16 20 18   Height:      Weight: 65.091 kg (143 lb 8 oz)   64.003 kg (141 lb 1.6 oz)  SpO2: 100% 100% 100% 100%   Weight change: 1.225 kg (2 lb 11.2 oz)  Intake/Output Summary (Last 24 hours) at 05/21/14 1058 Last data filed at 05/21/14 3007  Gross per 24 hour  Intake 2368.75 ml  Output    150 ml  Net 2218.75 ml   Physical Exam: General: Thin appearing male, alert, cooperative, NAD. Chronically ill-appearing.  HEENT: Left eye prosthesis. Right eye normal EOM Lungs: Clear to ascultation bilaterally, no wheezes, rales, rhonchi Cardiac: Regular rate, regular rhythm, no murmurs, gallops, or rubs Abdomen: Nontender, distended, normoactive bowel sounds.  Extremities: No lower extremity edema bilaterally  Lab Results: Basic Metabolic Panel:  Recent Labs Lab 05/20/14 0414 05/21/14 0428  NA 140 141  K 3.6 3.6  CL 114* 112  CO2 23 20  GLUCOSE 120* 103*  BUN 6 <5*  CREATININE 0.94 0.71  CALCIUM 8.3* 8.2*   Liver Function Tests:  Recent Labs Lab 05/17/14 1715  AST 20  ALT 14  ALKPHOS 134*  BILITOT 1.0  PROT 6.8  ALBUMIN 2.9*    Recent Labs Lab 05/17/14 1715  LIPASE 24   CBC:  Recent Labs Lab 05/17/14 1715  05/19/14 0443 05/20/14 0414 05/21/14 0428  WBC 9.8  < > 9.6 7.6 7.7  NEUTROABS 6.7  --  7.1  --   --   HGB  11.7*  < > 10.0* 8.8* 9.2*  HCT 37.8*  < > 32.9* 29.2* 30.4*  MCV 88.1  < > 86.6 88.0 87.4  PLT 241  < > 239 267 262  < > = values in this interval not displayed. Urinalysis:  Recent Labs Lab 05/17/14 1825  COLORURINE AMBER*  LABSPEC 1.022  PHURINE 5.0  GLUCOSEU NEGATIVE  HGBUR NEGATIVE  BILIRUBINUR SMALL*  KETONESUR NEGATIVE  PROTEINUR NEGATIVE  UROBILINOGEN 1.0  NITRITE NEGATIVE  LEUKOCYTESUR NEGATIVE   Studies/Results: Dg Abd Portable 1v  05/19/2014   CLINICAL DATA:  Abdominal distention with constipation.  EXAM: PORTABLE ABDOMEN - 1 VIEW  COMPARISON:  CT abdomen pelvis 05/18/2014.  FINDINGS: Oral contrast is seen in the appendix, colon and rectum. No small bowel dilatation. No unexpected radiopaque calculi.  IMPRESSION: Retained oral contrast within the colon and rectum. No acute findings. Please refer to cross-sectional imaging performed yesterday for further details and discussion.   Electronically Signed   By: Lorin Picket M.D.   On: 05/19/2014 15:32   Medications: I have reviewed the patient's current medications. Medications Prior to Admission  Medication Sig Dispense Refill  . dexamethasone (DECADRON) 4 MG tablet 2 tablets by mouth twice a day the day before, day of and day after the chemotherapy every 3 weeks. 40 tablet  1  . lidocaine-prilocaine (EMLA) cream Apply 1 application topically as needed. 30 g 0  . ondansetron (ZOFRAN ODT) 4 MG disintegrating tablet Take 1 tablet (4 mg total) by mouth every 8 (eight) hours as needed for nausea. 10 tablet 0  . ondansetron (ZOFRAN) 8 MG tablet Take 1 tablet (8 mg total) by mouth every 8 (eight) hours as needed for nausea. 20 tablet 3  . PRESCRIPTION MEDICATION Chemo at [facility]    . prochlorperazine (COMPAZINE) 10 MG tablet Take 1 tablet (10 mg total) by mouth every 6 (six) hours as needed. 60 tablet 0  . oxyCODONE-acetaminophen (PERCOCET) 5-325 MG per tablet Take 1-2 tablets by mouth every 6 (six) hours as needed for severe pain.  (Patient not taking: Reported on 05/17/2014) 20 tablet 0    Scheduled Meds: . bisacodyl  10 mg Rectal Once  . pantoprazole (PROTONIX) IV  40 mg Intravenous Daily  . piperacillin-tazobactam (ZOSYN)  IV  3.375 g Intravenous Q8H  . sodium chloride  3 mL Intravenous Q12H   Continuous Infusions: . 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   Metastatic Adenocarcinoma with Metastasis and Contained Bowel Perforation: Patient tolerated clear liquid diet for dinner last night. Pain is currently well controlled on current regimen. He only required Toradol once overnight and diluadid once yesterday afternoon. We have continued patient on IVF and ABx. We are continuing to hold palliative chemo until resolution of current issues. We have consulted palliative care who will see our patient today. Patient's bowel perforation seems to be improving as patient is tolerating a small amount of food and pain is controlled. We will continue management as below, repeat CT abdomen/pelvis today for progress versus improvement. We will discuss further plans and disposition once patient has been seen by palliative. Family seems agreeable to home hospice.  -Clear Liquid Diet -Continue D5-NS @ 75 cc/hr -Continue Zosyn Q8H -Dilaudid 0.5 mg Q4H prn for pain -Toradol 30 mg Q8H prn for pain -Protonix 40 mg IV qd -Repeat CBC/BMET tomorrow am -Medical Oncology following, appreciate recommendations -Surgery following, appreciate recommendations -Palliative care following, appreciate recommendations  GIB: FOBT positive prior to admission. Hgb stable, trend as follows:   Recent Labs Lab 05/19/14 0443  05/20/14 0414 05/21/14 0428  HGB 10.0* 8.8* 9.2*  HCT 32.9* 29.2* 30.4*  WBC 9.6 7.6 7.7  PLT 239 267 262   -Repeat CBC in AM  DVT/PE PPx: SCDs, Hold anticoagulation given recent +FOBT and contained bowel perforation. Patient unfortunately does have evidence of possible bowel ischemia, but given first two issues, we will hold of on anticoagualtion.   Dispo: Disposition is deferred at this time, awaiting improvement of current medical problems.  Anticipated discharge in approximately 1-2 day(s).   The patient does have a current PCP [name], MD) and does need an [facility] hospital follow-up appointment after discharge.  The patient does not have transportation limitations that hinder transportation to clinic appointments.  .Services Needed at time of discharge: Y = Yes, Blank = No PT:   OT:   RN:   Equipment:   Other:     LOS: 4 days   Osa Craver, DO PGY-1 Internal Medicine Resident Pager # [phone] 05/21/2014 10:58 AM

## 2014-05-21 NOTE — Progress Notes (Signed)
Pt seen and examined with Dr. Marvel Plan.   Pt feels better. Still with abd distension but states pain is improving and he is tolerating clear liquids. Pt also with a BM this AM as well as yesterday  Physical Exam: Gen: AAO*3, NAD CVS: RRR, normal heart sounds Pulm: CTA b/l Abd: soft, non tender, distended, normoactive BS + Ext: no edema  Assessment and Plan: 64 y/o male with hx of lung adenoCa with abd mets p/w abd pain and found to have contained perforation in proximal ileum with worsening mets  Abd pain with perforation/bowel ischemia: - surgery f/u noted. No surgical intervention is warranted at this time - c/w clear liquid diet - Repeat CT abd/pelvis today to assess perforation - c/w IVF - c/w zosyn and pain control. Will consider changing to PO abx depending on CT results - c/w PPI  Metastatic adenocarcinoma: - Oncology f/u noted. Poor prognosis explained to family by onc - Chemotherapy on hold for now - Palliative care to consult today. Pt will likely benefit from home hospice - Case d/w pt and family in detail and they are in agreement with plan

## 2014-05-21 NOTE — Progress Notes (Signed)
Palliative consult received and his chart reviewed in detail. Raymond Gregory is getting ready to go downstairs for interval CT imaging. He is requesting that his wife and two brothers be present for our conversation-he will have them call me with a time when they can all meet. Full consult to follow.  Lane Hacker, DO Palliative Medicine

## 2014-05-21 NOTE — Progress Notes (Addendum)
[facility] Progress Note  Subjective: Patient seen and examined this am. Abdomen appears distended and patient states he feels bloated. Patient tolerated clear liquid diet for dinner without any nausea or vomiting. He mentioned increased abdominal pain later in the evening, but overall abdominal pain has improved from prior with passage of flatus and with bowel movements. Had two BMs overnight. Patient had another bowel movement yesterday. He denies any fever or chills.   Objective: Vital signs in last 24 hours: Temp:  [98.3 F (36.8 C)-99.7 F (37.6 C)] 98.3 F (36.8 C) (01/20 0618) Pulse Rate:  [84-91] 87 (01/20 0618) Resp:  [16-20] 18 (01/20 0618) BP: (128-135)/(82-98) 128/89 mmHg (01/20 0618) SpO2:  [100 %] 100 % (01/20 0618) Weight:  [64.003 kg (141 lb 1.6 oz)] 64.003 kg (141 lb 1.6 oz) (01/20 0618) Last BM Date: 05/20/14  Intake/Output from previous day: 01/19 0701 - 01/20 0700 In: 2728.8 [P.O.:840; I.V.:1738.8; IV Piggyback:150] Out: 350 [Urine:350] Intake/Output this shift:    General appearance: alert, cooperative, appears stated age and no distress Resp: clear to auscultation bilaterally Cardio: regular rate and rhythm GI: nontender with moderate distension, soft, hypoactive BS  Lab Results:   Recent Labs  05/20/14 0414 05/21/14 0428  WBC 7.6 7.7  HGB 8.8* 9.2*  HCT 29.2* 30.4*  PLT 267 262   BMET  Recent Labs  05/20/14 0414 05/21/14 0428  NA 140 141  K 3.6 3.6  CL 114* 112  CO2 23 20  GLUCOSE 120* 103*  BUN 6 <5*  CREATININE 0.94 0.71  CALCIUM 8.3* 8.2*   PT/INR No results for input(s): LABPROT, INR in the last 72 hours. ABG No results for input(s): PHART, HCO3 in the last 72 hours.  Invalid input(s): PCO2, PO2  Studies/Results: Dg Abd Portable 1v  05/19/2014   CLINICAL DATA:  Abdominal distention with constipation.  EXAM: PORTABLE ABDOMEN - 1 VIEW  COMPARISON:  CT abdomen pelvis 05/18/2014.  FINDINGS: Oral contrast is seen  in the appendix, colon and rectum. No small bowel dilatation. No unexpected radiopaque calculi.  IMPRESSION: Retained oral contrast within the colon and rectum. No acute findings. Please refer to cross-sectional imaging performed yesterday for further details and discussion.   Electronically Signed   By: Lorin Picket M.D.   On: 05/19/2014 15:32    Anti-infectives: Anti-infectives    Start     Dose/Rate Route Frequency Ordered Stop   05/18/14 1200  piperacillin-tazobactam (ZOSYN) IVPB 3.375 g     3.375 g12.5 mL/hr over 240 Minutes Intravenous Every 8 hours 05/18/14 0514     05/18/14 0515  piperacillin-tazobactam (ZOSYN) IVPB 3.375 g     3.375 g100 mL/hr over 30 Minutes Intravenous  Once 05/18/14 0514 05/18/14 0727      Assessment/Plan: s/p * No surgery found *  LOS: 4 days   Metastatic Adenocarcinoma with Metastasis and Contained Bowel Perforation: Patient tolerated clear liquid diet for dinner last night. Exam still suggests ileus. Pain is currently well controlled on current regimen. He only required Toradol once overnight and diluadid once yesterday afternoon. Palliative care to see our patient today to discuss home hospice. Patient's bowel perforation seems to be improving as patient is tolerating a small amount of food and pain is controlled. Would proceed with caution with advancement of diet as abdomen is noticeably distended today. Repeat CT abdomen/pelvis today for progress versus improvement. We will discuss further plans and disposition once patient has been seen by palliative. Family seems agreeable to home hospice and voice  agreement with non-surgical management. -Clear Liquid Diet -Continue D5-NS @ 75 cc/hr -Continue Zosyn Q8H -Dilaudid 0.5 mg Q4H prn for pain -Toradol 30 mg Q8H prn for pain -Protonix 40 mg IV qd -Repeat CBC/BMET tomorrow am -Medical Oncology following, appreciate recommendations -Palliative care following, appreciate recommendations  [facility] Surgery Pager [phone] 05/21/2014  I reviewed CT this evening. Abdominal CT worse. Now with extraluminal fluid collection.  Unfortunately, no surgical cure/palliative option. Laparotomy and diversion would NOT improve quality of life. 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   Agree with urgent palliative care consult and hospice. Need to address code status ASAP Nothing to offer unfortunately. Signing off. Please call if need to discuss recs with family and pt  Leighton Ruff. Redmond Pulling, MD, FACS General, Bariatric, & Minimally Invasive Surgery [facility] Surgery, [place]

## 2014-05-22 ENCOUNTER — Telehealth: Payer: Self-pay | Admitting: *Deleted

## 2014-05-22 DIAGNOSIS — C801 Malignant (primary) neoplasm, unspecified: Secondary | ICD-10-CM

## 2014-05-22 LAB — CBC
HCT: 29.8 % — ABNORMAL LOW (ref 39.0–52.0)
HEMOGLOBIN: 9.1 g/dL — AB (ref 13.0–17.0)
MCH: 26.8 pg (ref 26.0–34.0)
MCHC: 30.5 g/dL (ref 30.0–36.0)
MCV: 87.9 fL (ref 78.0–100.0)
PLATELETS: 266 10*3/uL (ref 150–400)
RBC: 3.39 MIL/uL — ABNORMAL LOW (ref 4.22–5.81)
RDW: 16.6 % — AB (ref 11.5–15.5)
WBC: 8 10*3/uL (ref 4.0–10.5)

## 2014-05-22 LAB — BASIC METABOLIC PANEL
Anion gap: 8 (ref 5–15)
CO2: 21 mmol/L (ref 19–32)
Calcium: 8.2 mg/dL — ABNORMAL LOW (ref 8.4–10.5)
Chloride: 109 mEq/L (ref 96–112)
Creatinine, Ser: 0.65 mg/dL (ref 0.50–1.35)
GFR calc non Af Amer: >90 mL/min (ref >90)
Glucose, Bld: 89 mg/dL (ref 70–99)
Potassium: 3.6 mmol/L (ref 3.5–5.1)
SODIUM: 138 mmol/L (ref 135–145)

## 2014-05-22 MED ORDER — DEXTROSE-NACL 5-0.9 % IV SOLN
INTRAVENOUS | Status: DC
  Administered 2014-05-22 – 2014-05-23 (×2): via INTRAVENOUS

## 2014-05-22 NOTE — Telephone Encounter (Signed)
Patient to be discharged from [facility] tomorrow.  Yvette with Hospice called asking if Dr. Earlie Server will be the  attending Physician.  Verbal order eceived and read back from Dr. Willette Alma that yes he will b the atenfing provider.  Dewaine Oats given order with this phone call.

## 2014-05-22 NOTE — Progress Notes (Signed)
Subjective:  Patient seen and examined this am. Patient denies any increased abdominal pain, well controlled with medications. He has been passing flatus and stool. He was previously tolerating clear liquid diet. No fever or chills, nausea or vomiting.   Objective: Vital signs in last 24 hours: Filed Vitals:   05/21/14 0618 05/21/14 1332 05/21/14 2205 05/22/14 0557  BP: 128/89 126/82 116/87 129/79  Pulse: 87 90 88 83  Temp: 98.3 F (36.8 C) 99 F (37.2 C) 99.2 F (37.3 C) 98.6 F (37 C)  TempSrc:  Oral Oral   Resp: 18 18 17 16   Height:      Weight: 64.003 kg (141 lb 1.6 oz)     SpO2: 100% 100% 99% 100%   Weight change:   Intake/Output Summary (Last 24 hours) at 05/22/14 1113 Last data filed at 05/22/14 0537  Gross per 24 hour  Intake 986.25 ml  Output      0 ml  Net 986.25 ml   Physical Exam: General: Thin appearing male, alert, cooperative, NAD. Chronically ill-appearing.  HEENT: Left eye prosthesis. Right eye normal EOM Lungs: Clear to ascultation bilaterally, no wheezes, rales, rhonchi Cardiac: Regular rate, regular rhythm, no murmurs, gallops, or rubs Abdomen: Nontender, distended, normoactive bowel sounds.  Extremities: No lower extremity edema bilaterally  Lab Results: Basic Metabolic Panel:  Recent Labs Lab 05/21/14 0428 05/22/14 0500  NA 141 138  K 3.6 3.6  CL 112 109  CO2 20 21  GLUCOSE 103* 89  BUN <5* <5*  CREATININE 0.71 0.65  CALCIUM 8.2* 8.2*   Liver Function Tests:  Recent Labs Lab 05/17/14 1715  AST 20  ALT 14  ALKPHOS 134*  BILITOT 1.0  PROT 6.8  ALBUMIN 2.9*    Recent Labs Lab 05/17/14 1715  LIPASE 24   CBC:  Recent Labs Lab 05/17/14 1715  05/19/14 0443  05/21/14 0428 05/22/14 0500  WBC 9.8  < > 9.6  < > 7.7 8.0  NEUTROABS 6.7  --  7.1  --   --   --   HGB 11.7*  < > 10.0*  < > 9.2* 9.1*  HCT 37.8*  < > 32.9*  < > 30.4* 29.8*  MCV 88.1  < > 86.6  < > 87.4 87.9  PLT 241  < > 239  < > 262 266  < > = values in  this interval not displayed. Urinalysis:  Recent Labs Lab 05/17/14 1825  COLORURINE AMBER*  LABSPEC 1.022  PHURINE 5.0  GLUCOSEU NEGATIVE  HGBUR NEGATIVE  BILIRUBINUR SMALL*  KETONESUR NEGATIVE  PROTEINUR NEGATIVE  UROBILINOGEN 1.0  NITRITE NEGATIVE  LEUKOCYTESUR NEGATIVE   Studies/Results: Ct Abdomen Pelvis W Contrast  05/21/2014   CLINICAL DATA:  Lower abdominal pain. Nausea and vomiting. Diarrhea. Lung adenocarcinoma with abdominal metastatic disease. Contained perforation, small bowel.  EXAM: CT ABDOMEN AND PELVIS WITH CONTRAST  TECHNIQUE: Multidetector CT imaging of the abdomen and pelvis was performed using the standard protocol following bolus administration of intravenous contrast.  CONTRAST:  86mL OMNIPAQUE IOHEXOL 300 MG/ML  SOLN  COMPARISON:  05/18/2014  FINDINGS: Lower chest: Stable right greater than left pleural effusions with cavitary right lower lobe mass likewise stable from 3 days ago. Airway thickening and surrounding atelectasis in the right lower lobe and right middle lobe.  Hepatobiliary: Stable hypodense liver lesions.  Pancreas: Unremarkable  Spleen: Unremarkable  Adrenals/Urinary Tract: Stable mild nodularity of the medial limb right adrenal gland. Stable bilateral renal cysts. Probable residual contrast medium in the  renal collecting systems in urinary bladder accounting for the higher density of the ureter urinary bladder compared to the ascites.  Stomach/Bowel: Similar appearance of focal ulceration along the ileal mass contain by the serosa, images 54-59 of series 2. No clearly defined peritoneal leak of contrast is definitively seen. There is some borderline dilated loops of small bowel leading up to the small bowel mass. Diffuse infiltration of the mesentery and omentum, slightly worsened from prior. Continued abnormal thickening of the proximal sigmoid colon core, probably from tumor or bleeding into the wall of the sigmoid colon.  Vascular/Lymphatic: Aortoiliac  atherosclerotic vascular disease. Stable mesenteric and retroperitoneal adenopathy. Index mesenteric node 2.2 cm in short axis, image 45 series 2. Index left periaortic lymph node 1.5 cm in short axis, image 34 series 2.  Reproductive: Central calcifications in the prostate gland, as before.  Other: Increased ascites with some enhancement along the ascites margin concerning for malignant seeding, for example along the left paracolic gutter and posteriorly in the pelvis. Increased mesenteric and omental edema. Increased subcutaneous edema. New extraluminal 6.8 by 3.9 by 4.1 cm collection of gas in the left abdomen, appears fairly contained along loops of small bowel and descending colon on image 47 series 2.  Musculoskeletal: Mild degenerative retrolisthesis L5-S1 with loss of disc height at this level.  IMPRESSION: 1. New 6 cm collection of gas in the left abdomen is contained but extraluminal on image 47 series 2, probably related to small bowel perforation, less likely due to colon perforation. 2. Increased ascites with some enhancement along portions of the ascites raising concern for tumor seeding or peritonitis. Increased mesenteric, omental, and subcutaneous edema compatible with third spacing of fluid. 3. Proximal sigmoid colon wall thickening is striking and stable from 3 days ago but increased from 04/15/2014, possibly from tumor or bleeding into the bowel wall. 4. Cavitary mass in the ileum with ulceration contain by the serosa. 5. Various remaining findings including the right lower lobe lung mass and pathologic adenopathy in the abdomen are stable from 05/18/2014. These results will be called to the ordering clinician or representative by the Radiologist Assistant, and communication documented in the PACS or zVision Dashboard.   Electronically Signed   By: Sherryl Barters M.D.   On: 05/21/2014 16:59   Medications: I have reviewed the patient's current medications. Medications Prior to Admission    Medication Sig Dispense Refill  . dexamethasone (DECADRON) 4 MG tablet 2 tablets by mouth twice a day the day before, day of and day after the chemotherapy every 3 weeks. 40 tablet 1  . lidocaine-prilocaine (EMLA) cream Apply 1 application topically as needed. 30 g 0  . ondansetron (ZOFRAN ODT) 4 MG disintegrating tablet Take 1 tablet (4 mg total) by mouth every 8 (eight) hours as needed for nausea. 10 tablet 0  . ondansetron (ZOFRAN) 8 MG tablet Take 1 tablet (8 mg total) by mouth every 8 (eight) hours as needed for nausea. 20 tablet 3  . PRESCRIPTION MEDICATION Chemo at [facility]    . prochlorperazine (COMPAZINE) 10 MG tablet Take 1 tablet (10 mg total) by mouth every 6 (six) hours as needed. 60 tablet 0  . oxyCODONE-acetaminophen (PERCOCET) 5-325 MG per tablet Take 1-2 tablets by mouth every 6 (six) hours as needed for severe pain. (Patient not taking: Reported on 05/17/2014) 20 tablet 0    Scheduled Meds: . bisacodyl  10 mg Rectal Once  . pantoprazole (PROTONIX) IV  40 mg Intravenous Daily  . piperacillin-tazobactam (ZOSYN)  IV  3.375 g Intravenous Q8H  . sodium chloride  3 mL Intravenous Q12H   Continuous Infusions: . dextrose 5 % and 0.9% NaCl 75 mL/hr at 05/22/14 0941   PRN Meds:.sodium chloride, HYDROmorphone (DILAUDID) injection, ketorolac, sodium chloride   Assessment/Plan: 64 y/o M w/ PMHx of Lung Adenocarcinoma w/ suspected metastases to the abdomen, admitted for severe abdominal pain, thought to have metastatic involvement of the ileum w/ possible microperforation.   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 Patient is afebrile and without leukocytosis. CT abdomen/pelvis showed a new 6 cm collection of gas in the left abdomen which is contained but extraluminal probably related to small bowel perforation, less likely due to colon perforation. Increased ascites with some enhancement along portions of the ascites raising concern for tumor  seeding or peritonitis. Increased mesenteric, omental, and subcutaneous edema compatible with third spacing of fluid. Proximal sigmoid colon wall thickening is striking and stable from 3 days ago possibly from tumor or bleeding into the bowel wall. Cavitary mass in the ileum with ulceration contain by the serosa. This was discussed with surgery, who has signed off as he is not a surgical candidate and palliative is now on board. Surgery thinks this patient likely only has several days left. Palliative care will see our patient today. From a dietary standpoint, we will keep the patient NPO until palliative is able to see him. Patient could have a clear liquid diet as he can tolerate, but any solid food would likely cause him great pain and worsen his current condition.  -NPO for now, clear liquid diet after palliative -Continue D5-NS @ 75 cc/hr -Continue Zosyn Q8H -Dilaudid 0.5 mg Q4H prn for pain -Toradol 30 mg Q8H prn for pain -Protonix 40 mg IV qd -Repeat CMET tomorrow am -Medical Oncology following, appreciate recommendations -Surgery has signed off -Palliative care following, appreciate recommendations  GIB: FOBT positive prior to admission. Hgb stable, trend as follows:   Recent Labs Lab 05/20/14 0414 05/21/14 0428 05/22/14 0500  HGB 8.8* 9.2* 9.1*  HCT 29.2* 30.4* 29.8*  WBC 7.6 7.7 8.0  PLT 267 262 266   -Repeat CBC in AM  DVT/PE PPx: SCDs, Hold anticoagulation given recent +FOBT and contained bowel perforation. Patient unfortunately does have evidence of possible bowel ischemia, but given first two issues, we will hold of on anticoagualtion.   Dispo: Disposition is deferred at this time, awaiting improvement of current medical problems.  Anticipated discharge in approximately 1-2 day(s).   The patient does have a current PCP [name], MD) and does need an [facility] hospital follow-up appointment after discharge.  The patient does not have transportation limitations that  hinder transportation to clinic appointments.  .Services Needed at time of discharge: Y = Yes, Blank = No PT:   OT:   RN:   Equipment:   Other:     LOS: 5 days   Osa Craver, DO PGY-1 Internal Medicine Resident Pager # [phone] 05/22/2014 11:13 AM

## 2014-05-22 NOTE — Care Management (Addendum)
Referral called in to [facility] [place] , spoke with [place] . Dewaine Oats will start referral , however, due to weather unsure when patient can be admitted to hospice . If [facility] is closed tomorrow , on call nurse's number is [phone]. Magdalen Spatz RN BSN [phone]     Spoke to patient's wife , she wants patient to discharge tonight to home with [facility] [place] , explained [facility]  [place] could not admit patient tonight but possibly, tomorrow . Ms Shostak still wanted patient to discharge this evening .   Went back and discussed same with patient and his two sisters . Patient does not want to discharge this evening , he has a lot of medical questions . He is in agreement with [facility] [place] .    Patient's PCP is Dr Julien Nordmann.   Dr Marvel Plan aware and will come to talk with patient at 1630. Patient and sisters aware. Patient stated he will call his wife and update her.     Magdalen Spatz RN BSN      Received phone call from Dr Marvel Plan at 1540 to set up home hospice for possible discharge today .  Spoke with patient who wanted NCM to call his wife . Called left voice mail . Magdalen Spatz RN BSN

## 2014-05-22 NOTE — Progress Notes (Signed)
Pt seen and examined with Dr. Marvel Plan.   Pt states abd pain is well controlled but has persistent distension. He was tolerating a clear liquid diet  Physical Exam: Gen: AAO*3, NAD CVS: RRR, normal heart sounds Pulm: CTA b/l Abd: soft, non tender, distended, normoactive BS + Ext: no edema  Assessment and Plan: 64 y/o male with hx of lung adenoCa with abd mets p/w abd pain and found to have contained perforation in proximal ileum with worsening mets  Abd pain with perforation/bowel ischemia: - Repeat CT abd with new collection of gas in left abdomen likely related to abdominal perforation - Would maintain NPO for now - Palliative consult pending. Per surgery patient with very poor prognosis- days and would not benefit from surgical intervention - c/w IVF - c/w zosyn and pain control.  - c/w PPI  Metastatic adenocarcinoma: - Chemotherapy on hold for now - Palliative care to consult today. Pt will likely benefit from home hospice - Case d/w pt and family in detail and they are in agreement with plan

## 2014-05-23 ENCOUNTER — Other Ambulatory Visit: Payer: Medicare Other

## 2014-05-23 ENCOUNTER — Other Ambulatory Visit: Payer: Self-pay | Admitting: *Deleted

## 2014-05-23 ENCOUNTER — Telehealth: Payer: Self-pay | Admitting: Physician Assistant

## 2014-05-23 ENCOUNTER — Ambulatory Visit: Payer: Medicare Other | Admitting: Physician Assistant

## 2014-05-23 LAB — COMPREHENSIVE METABOLIC PANEL
ALK PHOS: 94 U/L (ref 39–117)
ALT: 11 U/L (ref 0–53)
AST: 16 U/L (ref 0–37)
Albumin: 1.8 g/dL — ABNORMAL LOW (ref 3.5–5.2)
Anion gap: 6 (ref 5–15)
CALCIUM: 8.2 mg/dL — AB (ref 8.4–10.5)
CHLORIDE: 108 meq/L (ref 96–112)
CO2: 25 mmol/L (ref 19–32)
CREATININE: 0.67 mg/dL (ref 0.50–1.35)
GFR calc Af Amer: >90 mL/min (ref >90)
GFR calc non Af Amer: >90 mL/min (ref >90)
Glucose, Bld: 95 mg/dL (ref 70–99)
Potassium: 3.5 mmol/L (ref 3.5–5.1)
SODIUM: 139 mmol/L (ref 135–145)
Total Bilirubin: 0.4 mg/dL (ref 0.3–1.2)
Total Protein: 5.1 g/dL — ABNORMAL LOW (ref 6.0–8.3)

## 2014-05-23 MED ORDER — KETOROLAC TROMETHAMINE 30 MG/ML IJ SOLN
30.0000 mg | Freq: Three times a day (TID) | INTRAMUSCULAR | Status: DC | PRN
  Administered 2014-05-25 – 2014-05-26 (×3): 30 mg via INTRAVENOUS
  Filled 2014-05-23 (×3): qty 1

## 2014-05-23 NOTE — Progress Notes (Signed)
Subjective:  Patient seen and examined this am. Patient is wondering if he should have a second opinion in [place] as he doesn't want to quit. He thanks [place] for the job we have done here. Patient does not want to go home until house is organized and cleaned up for hospital bed. Abdominal pain is stable. He has been tolerating some food by mouth.  Objective: Vital signs in last 24 hours: Filed Vitals:   05/22/14 1405 05/22/14 2053 05/23/14 0610 05/23/14 1400  BP: 126/84 118/81 132/84 132/93  Pulse: 77 87 81 85  Temp: 98.2 F (36.8 C) 98.4 F (36.9 C) 98.6 F (37 C) 98.4 F (36.9 C)  TempSrc: Oral Oral Oral Oral  Resp: 17 19 16 16   Height:      Weight:      SpO2: 100% 100% 100% 100%   Weight change:   Intake/Output Summary (Last 24 hours) at 05/23/14 1807 Last data filed at 05/23/14 1643  Gross per 24 hour  Intake 1982.5 ml  Output    275 ml  Net 1707.5 ml   Physical Exam: General: Thin appearing male, alert, cooperative, NAD. Chronically ill-appearing.  HEENT: Left eye prosthesis. Right eye normal EOM Lungs: Clear to ascultation bilaterally, no wheezes, rales, rhonchi Cardiac: Regular rate, regular rhythm, no murmurs, gallops, or rubs Abdomen: Nontender, distended, normoactive bowel sounds.  Extremities: No lower extremity edema bilaterally  Lab Results: Basic Metabolic Panel:  Recent Labs Lab 05/22/14 0500 05/23/14 0601  NA 138 139  K 3.6 3.5  CL 109 108  CO2 21 25  GLUCOSE 89 95  BUN <5* <5*  CREATININE 0.65 0.67  CALCIUM 8.2* 8.2*   Liver Function Tests:  Recent Labs Lab 05/17/14 1715 05/23/14 0601  AST 20 16  ALT 14 11  ALKPHOS 134* 94  BILITOT 1.0 0.4  PROT 6.8 5.1*  ALBUMIN 2.9* 1.8*    Recent Labs Lab 05/17/14 1715  LIPASE 24   CBC:  Recent Labs Lab 05/17/14 1715  05/19/14 0443  05/21/14 0428 05/22/14 0500  WBC 9.8  < > 9.6  < > 7.7 8.0  NEUTROABS 6.7  --  7.1  --   --   --   HGB 11.7*  < > 10.0*  < > 9.2* 9.1*  HCT 37.8*  <  > 32.9*  < > 30.4* 29.8*  MCV 88.1  < > 86.6  < > 87.4 87.9  PLT 241  < > 239  < > 262 266  < > = values in this interval not displayed. Urinalysis:  Recent Labs Lab 05/17/14 1825  COLORURINE AMBER*  LABSPEC 1.022  PHURINE 5.0  GLUCOSEU NEGATIVE  HGBUR NEGATIVE  BILIRUBINUR SMALL*  KETONESUR NEGATIVE  PROTEINUR NEGATIVE  UROBILINOGEN 1.0  NITRITE NEGATIVE  LEUKOCYTESUR NEGATIVE   Studies/Results: No results found. Medications: I have reviewed the patient's current medications. Medications Prior to Admission  Medication Sig Dispense Refill  . dexamethasone (DECADRON) 4 MG tablet 2 tablets by mouth twice a day the day before, day of and day after the chemotherapy every 3 weeks. 40 tablet 1  . lidocaine-prilocaine (EMLA) cream Apply 1 application topically as needed. 30 g 0  . ondansetron (ZOFRAN ODT) 4 MG disintegrating tablet Take 1 tablet (4 mg total) by mouth every 8 (eight) hours as needed for nausea. 10 tablet 0  . ondansetron (ZOFRAN) 8 MG tablet Take 1 tablet (8 mg total) by mouth every 8 (eight) hours as needed for nausea. 20 tablet 3  .  PRESCRIPTION MEDICATION Chemo at [facility]    . prochlorperazine (COMPAZINE) 10 MG tablet Take 1 tablet (10 mg total) by mouth every 6 (six) hours as needed. 60 tablet 0  . oxyCODONE-acetaminophen (PERCOCET) 5-325 MG per tablet Take 1-2 tablets by mouth every 6 (six) hours as needed for severe pain. (Patient not taking: Reported on 05/17/2014) 20 tablet 0    Scheduled Meds: . bisacodyl  10 mg Rectal Once  . pantoprazole (PROTONIX) IV  40 mg Intravenous Daily  . piperacillin-tazobactam (ZOSYN)  IV  3.375 g Intravenous Q8H  . sodium chloride  3 mL Intravenous Q12H   Continuous Infusions:   PRN Meds:.sodium chloride, HYDROmorphone (DILAUDID) injection, ketorolac, sodium chloride   Assessment/Plan: 64 y/o M w/ PMHx of Lung Adenocarcinoma w/ suspected metastases to the abdomen, admitted for severe abdominal pain, thought to have metastatic  involvement of the ileum w/ possible microperforation.   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 Patient continues to be afebrile and without leukocytosis. I had a long palliative care discussion with the patient yesterday and everyone has agreed to DNR and home with home hospice. Patient does not want to be discharged until his room is ready at home. Currently, even with progression of small bowel perforation, vital signs and physical exam are stable. Dr. Julien Nordmann as agreed to be patient's attending for hospice. He will be discharged home with home hospice likely tomorrow or Sunday with pain management -Full liquid diet -Continue Zosyn Q8H -Dilaudid 0.5 mg Q4H prn for pain -Toradol 30 mg Q8H prn for pain -Protonix 40 mg IV qd -Medical Oncology has signed off -Surgery has signed off -Palliative care has signed off  GIB: FOBT positive prior to admission. Hgb stable, trend as follows:   Recent Labs Lab 05/20/14 0414 05/21/14 0428 05/22/14 0500  HGB 8.8* 9.2* 9.1*  HCT 29.2* 30.4* 29.8*  WBC 7.6 7.7 8.0  PLT 267 262 266    DVT/PE PPx: SCDs, Hold anticoagulation given recent +FOBT and contained bowel perforation. Patient unfortunately does have evidence of possible bowel ischemia, but given first two issues, we will hold of on anticoagualtion.   Dispo: Disposition is deferred at this time, awaiting improvement of current medical problems.  Anticipated discharge in approximately 1-2 day(s).   The patient does have a current PCP [name], MD) and does need an [facility] hospital follow-up appointment after discharge.  The patient does not have transportation limitations that hinder transportation to clinic appointments.  .Services Needed at time of discharge: Y = Yes, Blank = No PT:   OT:   RN:   Equipment:   Other:     LOS: 6 days   Osa Craver, DO PGY-1 Internal Medicine Resident Pager # [phone] 05/23/2014 6:07 PM

## 2014-05-23 NOTE — Care Management Note (Signed)
  Page 1 of 1   05/23/2014     10:41:49 AM CARE MANAGEMENT NOTE 05/23/2014  Patient:  Raymond Gregory, Raymond Gregory   Account Number:  [id number]  Date Initiated:  05/20/2014  Documentation initiated by:  Magdalen Spatz  Subjective/Objective Assessment:     Action/Plan:   Anticipated DC Date:  05/24/2014   Anticipated DC Plan:  HOME W HOSPICE CARE         Choice offered to / List presented to:             Status of service:   Medicare Important Message given?  YES (If response is "NO", the following Medicare IM given date fields will be blank) Date Medicare IM given:  05/20/2014 Medicare IM given by:  Magdalen Spatz Date Additional Medicare IM given:  05/23/2014 Additional Medicare IM given by:  Magdalen Spatz  Discharge Disposition:    Per UR Regulation:  Reviewed for med. necessity/level of care/duration of stay  If discussed at [place] of Stay Meetings, dates discussed:   05/22/2014    Comments:  Patient and his wife will try to have home ready for hospital bed delivery in morning and discharge home with hospice in afternoon tomorrow . [facility] will call patient's wife in morning for delivery . [facility] aware .  When patient discharges home , please call [facility] [place].  Magdalen Spatz RN BSN [phone]

## 2014-05-23 NOTE — Progress Notes (Signed)
Chaplain attempted to visit with Raymond Gregory via consult placed. The room was dark and pt sleeping. Family member/friend in a chair at the far side of the room.   Made brief introduction and let them rest.   Page if needed.   Delford Field, [place] 05/23/2014

## 2014-05-23 NOTE — Telephone Encounter (Signed)
Pt is in hospital per 01/22 POF, cancelled all 01/22 visits.Marland Kitchen... KJ

## 2014-05-23 NOTE — Progress Notes (Signed)
Pt seen and examined with Dr. Marvel Plan.   Pt denies any increased abd pain. He states he did drink a little yesterday but has no appetite.  Physical Exam: Gen: AAO*3, NAD CVS: RRR, normal heart sounds Pulm: CTA b/l Abd: soft, non tender, distended, normoactive BS + Ext: no edema  Assessment and Plan: 64 y/o male with hx of lung adenoCa with abd mets p/w abd pain and found to have contained perforation in proximal ileum with worsening mets  Abd pain with perforation/bowel ischemia: - Repeat CT abd with new collection of gas in left abdomen likely related to abdominal perforation - Dr. Marvel Plan discussed case with patient and family in detail yesterday including poor prognosis and no surgical intervention. Pt is now DNR and family would like patient to be discharged to home hospice once equipment is set up at home - c/w IVF - c/w zosyn and pain control. Would consider switching to PO moxifloxacin on d/c  - c/w PPI  Metastatic adenocarcinoma: - Pt to be dc'd home on home hospice once equipment is set up - Dr. Inda Merlin to be attending provider on d/c - Case d/w pt and family in detail and they are in agreement with plan  D/c home once home hospice equipment in place.

## 2014-05-24 MED ORDER — SODIUM CHLORIDE 0.9 % IJ SOLN
10.0000 mL | INTRAMUSCULAR | Status: DC | PRN
  Administered 2014-05-27 (×2): 10 mL
  Filled 2014-05-24 (×2): qty 40

## 2014-05-24 NOTE — Progress Notes (Signed)
Subjective:  Patient seen and examined this am. Patient continues to have abdominal pain, controlled with pain medicines. Had a bowel movement yesterday. Tolerating some food. No fever or chills.   Objective: Vital signs in last 24 hours: Filed Vitals:   05/23/14 0610 05/23/14 1400 05/23/14 2245 05/24/14 0524  BP: 132/84 132/93 124/89 139/91  Pulse: 81 85 86 81  Temp: 98.6 F (37 C) 98.4 F (36.9 C) 98.5 F (36.9 C) 98.4 F (36.9 C)  TempSrc: Oral Oral Oral Oral  Resp: 16 16 16 14   Height:      Weight:    66.134 kg (145 lb 12.8 oz)  SpO2: 100% 100% 100% 100%   Weight change:   Intake/Output Summary (Last 24 hours) at 05/24/14 0911 Last data filed at 05/24/14 6644  Gross per 24 hour  Intake  557.5 ml  Output    325 ml  Net  232.5 ml   Physical Exam: General: Thin appearing male, alert, cooperative, NAD. Chronically ill-appearing.  HEENT: Left eye prosthesis. Right eye normal EOM Lungs: Clear to ascultation bilaterally, no wheezes, rales, rhonchi Cardiac: Regular rate, regular rhythm, no murmurs, gallops, or rubs Abdomen: Tender, distended, normoactive bowel sounds.  Extremities: No lower extremity edema bilaterally  Lab Results: Basic Metabolic Panel:  Recent Labs Lab 05/22/14 0500 05/23/14 0601  NA 138 139  K 3.6 3.5  CL 109 108  CO2 21 25  GLUCOSE 89 95  BUN <5* <5*  CREATININE 0.65 0.67  CALCIUM 8.2* 8.2*   Liver Function Tests:  Recent Labs Lab 05/17/14 1715 05/23/14 0601  AST 20 16  ALT 14 11  ALKPHOS 134* 94  BILITOT 1.0 0.4  PROT 6.8 5.1*  ALBUMIN 2.9* 1.8*    Recent Labs Lab 05/17/14 1715  LIPASE 24   CBC:  Recent Labs Lab 05/17/14 1715  05/19/14 0443  05/21/14 0428 05/22/14 0500  WBC 9.8  < > 9.6  < > 7.7 8.0  NEUTROABS 6.7  --  7.1  --   --   --   HGB 11.7*  < > 10.0*  < > 9.2* 9.1*  HCT 37.8*  < > 32.9*  < > 30.4* 29.8*  MCV 88.1  < > 86.6  < > 87.4 87.9  PLT 241  < > 239  < > 262 266  < > = values in this interval  not displayed. Urinalysis:  Recent Labs Lab 05/17/14 1825  COLORURINE AMBER*  LABSPEC 1.022  PHURINE 5.0  GLUCOSEU NEGATIVE  HGBUR NEGATIVE  BILIRUBINUR SMALL*  KETONESUR NEGATIVE  PROTEINUR NEGATIVE  UROBILINOGEN 1.0  NITRITE NEGATIVE  LEUKOCYTESUR NEGATIVE   Studies/Results: No results found. Medications: I have reviewed the patient's current medications. Medications Prior to Admission  Medication Sig Dispense Refill  . dexamethasone (DECADRON) 4 MG tablet 2 tablets by mouth twice a day the day before, day of and day after the chemotherapy every 3 weeks. 40 tablet 1  . lidocaine-prilocaine (EMLA) cream Apply 1 application topically as needed. 30 g 0  . ondansetron (ZOFRAN ODT) 4 MG disintegrating tablet Take 1 tablet (4 mg total) by mouth every 8 (eight) hours as needed for nausea. 10 tablet 0  . ondansetron (ZOFRAN) 8 MG tablet Take 1 tablet (8 mg total) by mouth every 8 (eight) hours as needed for nausea. 20 tablet 3  . PRESCRIPTION MEDICATION Chemo at [facility]    . prochlorperazine (COMPAZINE) 10 MG tablet Take 1 tablet (10 mg total) by mouth every 6 (six) hours  as needed. 60 tablet 0  . oxyCODONE-acetaminophen (PERCOCET) 5-325 MG per tablet Take 1-2 tablets by mouth every 6 (six) hours as needed for severe pain. (Patient not taking: Reported on 05/17/2014) 20 tablet 0    Scheduled Meds: . bisacodyl  10 mg Rectal Once  . pantoprazole (PROTONIX) IV  40 mg Intravenous Daily  . piperacillin-tazobactam (ZOSYN)  IV  3.375 g Intravenous Q8H  . 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 Patient will be discharged home with home hospice likely tomorrow or Sunday with pain management once patient is able to have all the equipment at home and patient can be transported home.  -Full liquid diet -Continue Zosyn Q8H -Dilaudid 0.5 mg Q4H prn for pain -Toradol 30 mg Q8H prn for pain -Protonix 40 mg IV qd  DVT/PE PPx: SCDs, Hold anticoagulation given recent +FOBT and contained bowel perforation. Patient unfortunately does have evidence of possible bowel ischemia, but given first two issues, we will hold of on anticoagualtion.   Dispo: Disposition is pending equipment at home and home hospice.  Anticipated discharge in approximately 1 day(s).   The patient does have a current PCP [name], MD) and does need an [facility] hospital follow-up appointment after discharge.  The patient does not have transportation limitations that hinder transportation to clinic appointments.  .Services Needed at time of discharge: Y = Yes, Blank = No PT:   OT:   RN:   Equipment:   Other:     LOS: 7 days   Osa Craver, DO PGY-1 Internal Medicine Resident Pager # [phone] 05/24/2014 9:11 AM

## 2014-05-25 NOTE — Progress Notes (Signed)
Subjective: Patient seen at bedside twice today w/ family present. Patient and family still seem to be in fear of home hospice and are very interested in a "second opinion". Many family members continue to be asking the same questions regarding prognosis, etc. Patient still w/ abdominal distension, mild tenderness. Passing stools and flatus. No fever, chills, or vomiting. Tolerating Ensure.   Objective: Vital signs in last 24 hours: Filed Vitals:   05/24/14 0524 05/24/14 1400 05/24/14 2157 05/25/14 0512  BP: 139/91 135/93 137/86 133/85  Pulse: 81 85 96 81  Temp: 98.4 F (36.9 C) 98.2 F (36.8 C) 98.7 F (37.1 C) 98.3 F (36.8 C)  TempSrc: Oral Oral Oral Oral  Resp: 14 16 15 15   Height:      Weight: 145 lb 12.8 oz (66.134 kg)   146 lb 11.2 oz (66.543 kg)  SpO2: 100% 100% 99% 100%   Weight change: 14.4 oz (0.408 kg)  Intake/Output Summary (Last 24 hours) at 05/25/14 4081 Last data filed at 05/24/14 1837  Gross per 24 hour  Intake    540 ml  Output    650 ml  Net   -110 ml   Physical Exam: General: Thin appearing AA male, alert, cooperative, NAD. Chronically ill-appearing.  HEENT: Left eye prosthesis. Right eye normal EOM Lungs: Clear to ascultation bilaterally, no wheezes, rales, rhonchi Cardiac: Regular rate, regular rhythm, no murmurs, gallops, or rubs Abdomen: Tender, distended, normoactive bowel sounds.  Extremities: No lower extremity edema bilaterally  Lab Results: Basic Metabolic Panel:  Recent Labs Lab 05/22/14 0500 05/23/14 0601  NA 138 139  K 3.6 3.5  CL 109 108  CO2 21 25  GLUCOSE 89 95  BUN <5* <5*  CREATININE 0.65 0.67  CALCIUM 8.2* 8.2*   Liver Function Tests:  Recent Labs Lab 05/23/14 0601  AST 16  ALT 11  ALKPHOS 94  BILITOT 0.4  PROT 5.1*  ALBUMIN 1.8*   CBC:  Recent Labs Lab 05/19/14 0443  05/21/14 0428 05/22/14 0500  WBC 9.6  < > 7.7 8.0  NEUTROABS 7.1  --   --   --   HGB 10.0*  < > 9.2* 9.1*  HCT 32.9*  < > 30.4* 29.8*    MCV 86.6  < > 87.4 87.9  PLT 239  < > 262 266  < > = values in this interval not displayed.  Medications: I have reviewed the patient's current medications. Medications Prior to Admission  Medication Sig Dispense Refill  . dexamethasone (DECADRON) 4 MG tablet 2 tablets by mouth twice a day the day before, day of and day after the chemotherapy every 3 weeks. 40 tablet 1  . lidocaine-prilocaine (EMLA) cream Apply 1 application topically as needed. 30 g 0  . ondansetron (ZOFRAN ODT) 4 MG disintegrating tablet Take 1 tablet (4 mg total) by mouth every 8 (eight) hours as needed for nausea. 10 tablet 0  . ondansetron (ZOFRAN) 8 MG tablet Take 1 tablet (8 mg total) by mouth every 8 (eight) hours as needed for nausea. 20 tablet 3  . PRESCRIPTION MEDICATION Chemo at [facility]    . prochlorperazine (COMPAZINE) 10 MG tablet Take 1 tablet (10 mg total) by mouth every 6 (six) hours as needed. 60 tablet 0  . oxyCODONE-acetaminophen (PERCOCET) 5-325 MG per tablet Take 1-2 tablets by mouth every 6 (six) hours as needed for severe pain. (Patient not taking: Reported on 05/17/2014) 20 tablet 0    Scheduled Meds: . bisacodyl  10 mg Rectal  Once  . pantoprazole (PROTONIX) IV  40 mg Intravenous Daily  . piperacillin-tazobactam (ZOSYN)  IV  3.375 g Intravenous Q8H  . 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 Family once again had many questions today. Still seem to be in a significant amount of denial regarding likely outcome and prognosis. Patient and family wished  to get a "second opinion" regarding his diagnosis, prognosis, and treatment options. Were interested in transfer to [facility], [place], etc. Had very long discussion about CT findings, rapid progression of cancer, involvement of bowel, and concept of home hospice. Per family request, called on-call medical oncologist at [facility], Dr. Linzie Collin, and discussed his case at length involving initial diagnosis, treatment regimens (per Dr. Lew Dawes note), CT findings, and recent progression. He agreed with treatment plan and home hospice at this time and did not feel the patient would benefit from transfer to an academic center. He did mention that the patient could schedule an appointment as an outpatient in the [place] clinic if they wish to discuss further after discharge. Given continued questions and concerns of the family, will re-consult palliative care team for further family discussion regarding future plans.  -Palliative care consult as above -Continue Zosyn IV  -Continue full liquid diet -Pain control; Dilaudid 0.5 mg q4h prn, Toradol 30 mg q8h prn for pain -Continue Protonix 40 mg IV qd  DVT/PE PPx: SCDs, Hold anticoagulation given recent +FOBT and contained bowel perforation. Patient unfortunately does have evidence of possible bowel ischemia, but given first two issues, we will hold of on anticoagualtion.   Dispo: Disposition is pending equipment at home and home hospice.  Anticipated discharge in approximately 1-3 day(s).   The patient does have a current PCP [name], MD) and does need an [facility] hospital follow-up appointment after discharge.  The patient does not have transportation limitations that hinder transportation to clinic appointments.  .Services Needed at time of discharge: Y = Yes, Blank = No PT:   OT:   RN:   Equipment:   Other:     LOS: 8 days   Signed: Luanne Bras, MD 05/25/2014 8:24 AM

## 2014-05-26 DIAGNOSIS — R1031 Right lower quadrant pain: Secondary | ICD-10-CM

## 2014-05-26 DIAGNOSIS — R1032 Left lower quadrant pain: Secondary | ICD-10-CM

## 2014-05-26 MED ORDER — LEVOFLOXACIN 750 MG PO TABS
750.0000 mg | ORAL_TABLET | Freq: Every day | ORAL | Status: DC
  Administered 2014-05-26 – 2014-05-27 (×2): 750 mg via ORAL
  Filled 2014-05-26 (×2): qty 1

## 2014-05-26 MED ORDER — HYDROMORPHONE HCL 2 MG PO TABS
2.0000 mg | ORAL_TABLET | ORAL | Status: DC | PRN
  Administered 2014-05-26 – 2014-05-27 (×3): 2 mg via ORAL
  Filled 2014-05-26 (×3): qty 1

## 2014-05-26 MED ORDER — PANTOPRAZOLE SODIUM 40 MG PO TBEC
40.0000 mg | DELAYED_RELEASE_TABLET | Freq: Every day | ORAL | Status: DC
  Administered 2014-05-27: 40 mg via ORAL
  Filled 2014-05-26: qty 1

## 2014-05-26 NOTE — Consult Note (Signed)
Received call from primary team that family wanted to meet with palliative care. They were actually hoping to meet with hospice doctor (which I am not), but I was able to answer some of their questions in general about hospice care.  We talked about importance of them communicating with hospice team and how they could be best of assistance. Also answered questions in general about NSCLC and why sometimes chemotherapy does not work as we hope and how that treatment for metastatic disease like his is not with curative intent.  They are still interested in seeking 2nd opinion when they leave and plan on checking in with [facility].  They are free to do so, even under hospice care. Raymond Gregory feels like his symptoms are well controlled and just wanted to make sure someone would write prescriptions and make sure he gets his meds. Primary team will need to write scripts prior to d/c.  They would like to complete course of abx orally at home which can also be done under hospice care, but will need prescription from primary team prior to discharge.  They plan to d/c home with hospice care tomorrow.    If there are further questions feel free to contact me directly as I got to know patient/family some this afternoon.  I apologize that service demands did not allow more timely palliative care consultation.   No Charge  Doran Clay D.O. Palliative Medicine Team at [facility]  Pager: [phone] Team Phone: [phone]

## 2014-05-26 NOTE — Progress Notes (Signed)
Pt seen and examined with Dr. Marvel Plan.   Pt denies any increased abd pain. He is having BM and tolerating liquid diet. Pt and family would like a second opinion as far as his cancer treatment goes.  Physical Exam: Gen: AAO*3, NAD CVS: RRR, normal heart sounds Pulm: CTA b/l Abd: soft, non tender, distended but mildly improved, normoactive BS + Ext: no edema  Assessment and Plan: 64 y/o male with hx of lung adenoCa with abd mets p/w abd pain and found to have contained perforation in proximal ileum with worsening mets  Abd pain with perforation/bowel ischemia: - Several conversations had with family over weekend by residents but patient and family continue to be in denial about prognosis and want second opinion. Resident did have a conversation with oncology at [facility] but they said they would not transfer him but would be willing to follow with him as an outpatient for a second opinion. This was explained to family as well - If patient is desirous of second opinion his best option would be to go home and f/u as an outpatient - Palliative care was consulted again yesterday and Dr. Deitra Mayo was supposed to see patient today. - c/w zosyn and pain control. Would consider switching to PO levaquin on d/c as family would like patient to remain on abx for perforation despite poor prognosis - c/w PPI  Metastatic adenocarcinoma: - Pt to be dc'd home on home hospice once equipment is set up - Dr. Inda Merlin to be attending provider on d/c   D/c home once home hospice equipment in place.

## 2014-05-26 NOTE — Care Management Note (Signed)
IM from medicare given to patient and his wife. Both voiced understanding . Magdalen Spatz RN BSN

## 2014-05-26 NOTE — Progress Notes (Signed)
Subjective:  Patient was seen and examined this morning. Abdominal pain controlled, no complaint of nausea or vomiting. Patient tolerating po and having bowel movements. Patient and family continue to have many questions regarding prognosis and plan. They would like a second opinion. They have no accepted home hospice equipment, because they do not want to be at the house for delivery.   Objective: Vital signs in last 24 hours: Filed Vitals:   05/24/14 2157 05/25/14 0512 05/25/14 2225 05/26/14 0512  BP: 137/86 133/85 132/87 135/85  Pulse: 96 81 92 86  Temp: 98.7 F (37.1 C) 98.3 F (36.8 C) 98.6 F (37 C) 98 F (36.7 C)  TempSrc: Oral Oral Oral   Resp: 15 15 16 16   Height:      Weight:  66.543 kg (146 lb 11.2 oz)  66.044 kg (145 lb 9.6 oz)  SpO2: 99% 100% 100% 100%   Weight change: -0.499 kg (-1 lb 1.6 oz)  Intake/Output Summary (Last 24 hours) at 05/26/14 8563 Last data filed at 05/25/14 1619  Gross per 24 hour  Intake    320 ml  Output    500 ml  Net   -180 ml   Physical Exam: General: Thin appearing AA male, alert, cooperative, NAD. Chronically ill-appearing.  HEENT: Left eye prosthesis. Right eye normal EOM Lungs: Clear to ascultation bilaterally, no wheezes, rales, rhonchi Cardiac: Regular rate, regular rhythm Abdomen: Tender, distended, normoactive bowel sounds.  Extremities: No lower extremity edema bilaterally  Lab Results: Basic Metabolic Panel:  Recent Labs Lab 05/22/14 0500 05/23/14 0601  NA 138 139  K 3.6 3.5  CL 109 108  CO2 21 25  GLUCOSE 89 95  BUN <5* <5*  CREATININE 0.65 0.67  CALCIUM 8.2* 8.2*   Liver Function Tests:  Recent Labs Lab 05/23/14 0601  AST 16  ALT 11  ALKPHOS 94  BILITOT 0.4  PROT 5.1*  ALBUMIN 1.8*   CBC:  Recent Labs Lab 05/21/14 0428 05/22/14 0500  WBC 7.7 8.0  HGB 9.2* 9.1*  HCT 30.4* 29.8*  MCV 87.4 87.9  PLT 262 266    Medications: I have reviewed the patient's current medications. Medications  Prior to Admission  Medication Sig Dispense Refill  . dexamethasone (DECADRON) 4 MG tablet 2 tablets by mouth twice a day the day before, day of and day after the chemotherapy every 3 weeks. 40 tablet 1  . lidocaine-prilocaine (EMLA) cream Apply 1 application topically as needed. 30 g 0  . ondansetron (ZOFRAN ODT) 4 MG disintegrating tablet Take 1 tablet (4 mg total) by mouth every 8 (eight) hours as needed for nausea. 10 tablet 0  . ondansetron (ZOFRAN) 8 MG tablet Take 1 tablet (8 mg total) by mouth every 8 (eight) hours as needed for nausea. 20 tablet 3  . PRESCRIPTION MEDICATION Chemo at [facility]    . prochlorperazine (COMPAZINE) 10 MG tablet Take 1 tablet (10 mg total) by mouth every 6 (six) hours as needed. 60 tablet 0  . oxyCODONE-acetaminophen (PERCOCET) 5-325 MG per tablet Take 1-2 tablets by mouth every 6 (six) hours as needed for severe pain. (Patient not taking: Reported on 05/17/2014) 20 tablet 0    Scheduled Meds: . bisacodyl  10 mg Rectal Once  . pantoprazole  40 mg Oral Daily  . piperacillin-tazobactam (ZOSYN)  IV  3.375 g Intravenous Q8H  . 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 After several long conversations over the past week, patient and family continue to be hesitant with plan and confused regarding prognosis. We have re-consulted palliative care team for further family discussion regarding future plans. This was discussed today with Dr. Deitra Mayo and a member of palliative care team will be by today. We also could use great  help from social work and Tourist information centre manager to encourage family to accept equipment at home or decide to go to outpatient hospice.  -Palliative care consult as above, greatly appreciate recommendations and help -Continue Zosyn IV, consider transition to po based on palliative recommendations -Continue full liquid diet -Pain control; Dilaudid 0.5 mg q4h prn, Toradol 30 mg q8h prn for pain -Continue Protonix 40 mg po qd  DVT/PE PPx: SCDs, Hold anticoagulation given recent +FOBT and contained bowel perforation. Patient unfortunately does have evidence of possible bowel ischemia, but given first two issues, we will hold of on anticoagualtion.   Dispo: Disposition is pending equipment at home and home hospice.  Anticipated discharge whenever family has made decision for discharge.  The patient does have a current PCP [name], MD) and does need an [facility] hospital follow-up appointment after discharge.  The patient does not have transportation limitations that hinder transportation to clinic appointments.  .Services Needed at time of discharge: Y = Yes, Blank = No PT:   OT:   RN:   Equipment:   Other:     LOS: 9 days   Signed: Osa Craver, DO PGY-1 Internal Medicine Resident Pager # [phone] 05/26/2014 9:38 AM

## 2014-05-27 ENCOUNTER — Other Ambulatory Visit: Payer: Self-pay | Admitting: Internal Medicine

## 2014-05-27 DIAGNOSIS — C3490 Malignant neoplasm of unspecified part of unspecified bronchus or lung: Secondary | ICD-10-CM

## 2014-05-27 DIAGNOSIS — K631 Perforation of intestine (nontraumatic): Secondary | ICD-10-CM

## 2014-05-27 DIAGNOSIS — C799 Secondary malignant neoplasm of unspecified site: Secondary | ICD-10-CM

## 2014-05-27 MED ORDER — MORPHINE SULFATE (CONCENTRATE) 10 MG /0.5 ML PO SOLN: 10.0000 mg | ORAL | Status: AC | PRN

## 2014-05-27 MED ORDER — HEPARIN SOD (PORK) LOCK FLUSH 100 UNIT/ML IV SOLN
500.0000 [IU] | INTRAVENOUS | Status: AC | PRN
  Administered 2014-05-27: 500 [IU]

## 2014-05-27 MED ORDER — MOXIFLOXACIN HCL 400 MG PO TABS: 400.0000 mg | ORAL_TABLET | Freq: Every day | ORAL | Status: DC

## 2014-05-27 MED ORDER — HYDROMORPHONE HCL 2 MG PO TABS: 2.0000 mg | ORAL_TABLET | ORAL | Status: AC | PRN

## 2014-05-27 MED ORDER — ONDANSETRON HCL 8 MG PO TABS: 8.0000 mg | ORAL_TABLET | Freq: Three times a day (TID) | ORAL | Status: AC | PRN

## 2014-05-27 MED ORDER — PANTOPRAZOLE SODIUM 40 MG PO TBEC: 40.0000 mg | DELAYED_RELEASE_TABLET | Freq: Every day | ORAL | Status: AC

## 2014-05-27 NOTE — Progress Notes (Signed)
Family verbally understood DC instructions with no questions ask. Gold DNR form sent home with pt and family.

## 2014-05-27 NOTE — Progress Notes (Signed)
Follow-up: pt referred to Hospice and [place] for services after discharge- spoke with pt, wife and sisters at bedside this afternoon- per discussion they are not planning to seek second opinion at this time; they voiced understanding hospice services are for comfort care and that the goal at this time was not to seek aggressive treatment.   Per discussion the plan is to discharge home today and family/pt agreeable to [facility] admission RN to see pt tomorrow. Wednesday 05/28/14 at 10 am   Wife stated hospital bed to be delivered tot he home today between 12-5 pm; please send completed GOLD DNR Form home with pt as well as any needed prescriptions. [facility] aware of above and has scheduled an admission RN to see family tomorrow at 37 am Danton Sewer, [facility] Liaison [phone]

## 2014-05-27 NOTE — Progress Notes (Signed)
Pt seen and examined with Dr. Marvel Plan.   Pt states abd pain is controlled on current regimen. No new complaints  Physical Exam: Gen: AAO*3, NAD CVS: RRR, normal heart sounds Pulm: CTA b/l Abd: soft, mild diffuse tender, distended, normoactive BS + Ext: no edema  Assessment and Plan: 64 y/o male with hx of lung adenoCa with abd mets p/w abd pain and found to have contained perforation in proximal ileum with worsening mets  Abd pain with perforation/bowel ischemia: - Palliative f/u appreciated - Pt and family want to go home today - c/w apain control - d/c zosyn. Consider PO moxifloxacin on d/c  - c/w PPI  Metastatic adenocarcinoma: - Pt to be dc'd home on home hospice today - Dr. Inda Merlin to be attending provider on d/c   D/c home today. Discussion held with family about home hospice and expectations. They are in agreement with current plan

## 2014-05-27 NOTE — Progress Notes (Signed)
Subjective:  Patient was seen and examined this morning. Patient did well overnight. He feels his abdomen is more swollen, but pain is controlled on current medicines. He states he does not like to take to much pain medicine. He denies any other symptoms. Patient and wife state they are going home today and hospital bed will be delivered between 1-5.   Objective: Vital signs in last 24 hours: Filed Vitals:   05/26/14 0512 05/26/14 1303 05/26/14 2037 05/27/14 0434  BP: 135/85 125/91 125/90 127/80  Pulse: 86 94 96 96  Temp: 98 F (36.7 C) 98.5 F (36.9 C) 99.9 F (37.7 C) 98.4 F (36.9 C)  TempSrc:  Oral Oral Oral  Resp: $Remo'16 16 17 18  'PeKJT$ Height:      Weight: 66.044 kg (145 lb 9.6 oz)   66.679 kg (147 lb)  SpO2: 100% 100% 100% 100%   Weight change: 0.635 kg (1 lb 6.4 oz)  Intake/Output Summary (Last 24 hours) at 05/27/14 1112 Last data filed at 05/27/14 0630  Gross per 24 hour  Intake     10 ml  Output      0 ml  Net     10 ml   Physical Exam: General: Thin appearing male, alert, cooperative, NAD. Chronically ill-appearing.  HEENT: Left eye prosthesis. Right eye normal EOM Lungs: Clear to ascultation bilaterally, no wheezes, rales, rhonchi Cardiac: Tachycardic, regular rhythm Abdomen: Tender, distended, normoactive bowel sounds.  Extremities: No lower extremity edema bilaterally  Lab Results: Basic Metabolic Panel:  Recent Labs Lab 05/22/14 0500 05/23/14 0601  NA 138 139  K 3.6 3.5  CL 109 108  CO2 21 25  GLUCOSE 89 95  BUN <5* <5*  CREATININE 0.65 0.67  CALCIUM 8.2* 8.2*   Liver Function Tests:  Recent Labs Lab 05/23/14 0601  AST 16  ALT 11  ALKPHOS 94  BILITOT 0.4  PROT 5.1*  ALBUMIN 1.8*   CBC:  Recent Labs Lab 05/21/14 0428 05/22/14 0500  WBC 7.7 8.0  HGB 9.2* 9.1*  HCT 30.4* 29.8*  MCV 87.4 87.9  PLT 262 266    Medications: I have reviewed the patient's current medications. Medications Prior to Admission  Medication Sig Dispense  Refill  . dexamethasone (DECADRON) 4 MG tablet 2 tablets by mouth twice a day the day before, day of and day after the chemotherapy every 3 weeks. 40 tablet 1  . lidocaine-prilocaine (EMLA) cream Apply 1 application topically as needed. 30 g 0  . ondansetron (ZOFRAN ODT) 4 MG disintegrating tablet Take 1 tablet (4 mg total) by mouth every 8 (eight) hours as needed for nausea. 10 tablet 0  . ondansetron (ZOFRAN) 8 MG tablet Take 1 tablet (8 mg total) by mouth every 8 (eight) hours as needed for nausea. 20 tablet 3  . PRESCRIPTION MEDICATION Chemo at [facility]    . prochlorperazine (COMPAZINE) 10 MG tablet Take 1 tablet (10 mg total) by mouth every 6 (six) hours as needed. 60 tablet 0  . oxyCODONE-acetaminophen (PERCOCET) 5-325 MG per tablet Take 1-2 tablets by mouth every 6 (six) hours as needed for severe pain. (Patient not taking: Reported on 05/17/2014) 20 tablet 0    Scheduled Meds: . bisacodyl  10 mg Rectal Once  . levofloxacin  750 mg Oral Daily  . pantoprazole  40 mg Oral Daily  . 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 Patient will be discharged to home with home hospice care today. Hospital bed to be delivered between 1 and 5. Patient will be discharged home with oral antibiotics and pain medications.  -Levaquin 750 mg daily -Transition to Moxifloxacin 400 mg daily on discharge, indefinitely -Continue full liquid diet -Dilaudid 2 mg q4h prn -Toradol 30 mg q8h prn for pain -Continue Protonix 40 mg po  qd  DVT/PE PPx: SCDs, Hold anticoagulation given recent +FOBT and contained bowel perforation. Patient unfortunately does have evidence of possible bowel ischemia, but given first two issues, we will hold of on anticoagualtion.   Dispo: Disposition is pending equipment at home and home hospice.  Anticipated discharge today.  The patient does have a current PCP [name], MD) and does need an [facility] hospital follow-up appointment after discharge.  The patient does not have transportation limitations that hinder transportation to clinic appointments.  .Services Needed at time of discharge: Y = Yes, Blank = No PT:   OT:   RN:   Equipment:   Other:     LOS: 10 days   Signed: Osa Craver, DO PGY-1 Internal Medicine Resident Pager # [phone] 05/27/2014 11:12 AM

## 2014-05-27 NOTE — Discharge Summary (Signed)
Name: Raymond Gregory MRN: [id number] DOB: 07-29-1950 64 y.o. PCP: Raymond Earthly, MD  Date of Admission: 05/17/2014  4:50 PM Date of Discharge: 05/27/2014 Attending Physician: Dr. Dareen Gregory Discharge Diagnosis:  Principal Problem:   Small bowel perforation Active Problems:   Primary cancer of right lower lobe of lung   GI bleed   Acute bilateral lower abdominal pain   Metastasis from malignant tumor of lung  Discharge Medications:   Medication List    STOP taking these medications        dexamethasone 4 MG tablet  Commonly known as:  DECADRON     oxyCODONE-acetaminophen 5-325 MG per tablet  Commonly known as:  PERCOCET     PRESCRIPTION MEDICATION      TAKE these medications        HYDROmorphone 2 MG tablet  Commonly known as:  DILAUDID  Take 1 tablet (2 mg total) by mouth every 4 (four) hours as needed for severe pain.     lidocaine-prilocaine cream  Commonly known as:  EMLA  Apply 1 application topically as needed.     morphine CONCENTRATE 10 mg / 0.5 ml concentrated solution  Take 0.5-1 mLs (10-20 mg total) by mouth every 2 (two) hours as needed for severe pain.     moxifloxacin 400 MG tablet  Commonly known as:  AVELOX  Take 1 tablet (400 mg total) by mouth daily at 8 pm.     ondansetron 4 MG disintegrating tablet  Commonly known as:  ZOFRAN ODT  Take 1 tablet (4 mg total) by mouth every 8 (eight) hours as needed for nausea.     ondansetron 8 MG tablet  Commonly known as:  ZOFRAN  Take 1 tablet (8 mg total) by mouth every 8 (eight) hours as needed for nausea.     pantoprazole 40 MG tablet  Commonly known as:  PROTONIX  Take 1 tablet (40 mg total) by mouth daily.     prochlorperazine 10 MG tablet  Commonly known as:  COMPAZINE  Take 1 tablet (10 mg total) by mouth every 6 (six) hours as needed.        Disposition and follow-up:   [name] was discharged from [facility] in Good condition.  At the hospital follow up visit  please address:  1.  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 Please assess need for antibiotics and discontinue if unnecessary or patient does not wish to continue.   2.  Labs / imaging needed at time of follow-up: None  3.  Pending labs/ test needing follow-up: None  Follow-up Appointments: Follow-up Information    Follow up with Raymond Kempf., MD.   Specialty:  Oncology   Why:  As needed   Contact information:   [place] [address] [phone]       Discharge Instructions: Discharge Instructions    Diet - low sodium heart healthy    Complete by:  As directed      Increase activity slowly    Complete by:  As directed            Consultations: Treatment Team:  Raymond Bears, MD [facility]  Procedures Performed:  Ct Chest W Contrast  05/07/2014   CLINICAL DATA:  Restaging metastatic non-small cell lung cancer.  EXAM: CT CHEST WITH CONTRAST  TECHNIQUE: Multidetector CT imaging of the chest was performed during intravenous contrast administration.  CONTRAST:  80mL OMNIPAQUE IOHEXOL 300  MG/ML  SOLN  COMPARISON:  03/12/2014  FINDINGS: Mediastinum: The heart size is normal. No pericardial effusion. The trachea is patent and appears midline. Normal appearance of the esophagus. Calcified atherosclerotic disease involves the aortic arch. No mediastinal adenopathy identified.  Lungs/Pleura: There has been mild decrease in volume of right pleural effusion. Cavitary mass in the right lower lobe measures 4.2 x 3.2 cm, image 40/series 5. Previously 4.1 x 3.3 cm. The index lesion within the right middle lobe measures 1.4 by 1.6 cm, image 31/series 5. Previously 1.8 x 1.8 cm. Fibrosis, architectural distortion and scarring is identified throughout the right lung. Nodule in the left lower lobe is stable measuring 1.4 cm, image 20/series 5. Increase in size of left upper lobe nodule which measures 6 mm,  image 18/ series 5. Previously 4 mm. Faint ground-glass attenuating nodule within the left apex is unchanged measuring 9 mm, image 10/series 5.  Upper Abdomen: Perihepatic ascites identified. Lesion along the segment 7 of the liver measures 1.1 cm, image 48/ series 2. Unchanged from previous exam. The other small low-attenuation lesions along the dome of liver appears similar to previous exam. Periappendiceal lymph node measures 1.3 cm, image 63/series 2. Unchanged from previous exam.  Musculoskeletal: Review of the visualized bony structures is negative for aggressive lytic or sclerotic bone lesion.  IMPRESSION: 1. Mixed response to therapy. Dominant mass within the right lower lobe is not significantly changed in size from previous exam. There has been mild decrease in size of the index lesion in the right middle lobe. Left lower lobe lung nodule is stable. There is a nodule in the left upper lobe which has increased in size in the interval. 2. Stable low-attenuation lesions within the liver. There has been no change and periaortic adenopathy within the upper abdomen   Electronically Signed   By: Raymond Gregory M.D.   On: 05/07/2014 16:49   Ct Abdomen Pelvis W Contrast  05/21/2014   CLINICAL DATA:  Lower abdominal pain. Nausea and vomiting. Diarrhea. Lung adenocarcinoma with abdominal metastatic disease. Contained perforation, small bowel.  EXAM: CT ABDOMEN AND PELVIS WITH CONTRAST  TECHNIQUE: Multidetector CT imaging of the abdomen and pelvis was performed using the standard protocol following bolus administration of intravenous contrast.  CONTRAST:  46mL OMNIPAQUE IOHEXOL 300 MG/ML  SOLN  COMPARISON:  05/18/2014  FINDINGS: Lower chest: Stable right greater than left pleural effusions with cavitary right lower lobe mass likewise stable from 3 days ago. Airway thickening and surrounding atelectasis in the right lower lobe and right middle lobe.  Hepatobiliary: Stable hypodense liver lesions.  Pancreas:  Unremarkable  Spleen: Unremarkable  Adrenals/Urinary Tract: Stable mild nodularity of the medial limb right adrenal gland. Stable bilateral renal cysts. Probable residual contrast medium in the renal collecting systems in urinary bladder accounting for the higher density of the ureter urinary bladder compared to the ascites.  Stomach/Bowel: Similar appearance of focal ulceration along the ileal mass contain by the serosa, images 54-59 of series 2. No clearly defined peritoneal leak of contrast is definitively seen. There is some borderline dilated loops of small bowel leading up to the small bowel mass. Diffuse infiltration of the mesentery and omentum, slightly worsened from prior. Continued abnormal thickening of the proximal sigmoid colon core, probably from tumor or bleeding into the wall of the sigmoid colon.  Vascular/Lymphatic: Aortoiliac atherosclerotic vascular disease. Stable mesenteric and retroperitoneal adenopathy. Index mesenteric node 2.2 cm in short axis, image 45 series 2. Index left periaortic lymph node 1.5 cm  in short axis, image 34 series 2.  Reproductive: Central calcifications in the prostate gland, as before.  Other: Increased ascites with some enhancement along the ascites margin concerning for malignant seeding, for example along the left paracolic gutter and posteriorly in the pelvis. Increased mesenteric and omental edema. Increased subcutaneous edema. New extraluminal 6.8 by 3.9 by 4.1 cm collection of gas in the left abdomen, appears fairly contained along loops of small bowel and descending colon on image 47 series 2.  Musculoskeletal: Mild degenerative retrolisthesis L5-S1 with loss of disc height at this level.  IMPRESSION: 1. New 6 cm collection of gas in the left abdomen is contained but extraluminal on image 47 series 2, probably related to small bowel perforation, less likely due to colon perforation. 2. Increased ascites with some enhancement along portions of the ascites  raising concern for tumor seeding or peritonitis. Increased mesenteric, omental, and subcutaneous edema compatible with third spacing of fluid. 3. Proximal sigmoid colon wall thickening is striking and stable from 3 days ago but increased from 04/15/2014, possibly from tumor or bleeding into the bowel wall. 4. Cavitary mass in the ileum with ulceration contain by the serosa. 5. Various remaining findings including the right lower lobe lung mass and pathologic adenopathy in the abdomen are stable from 05/18/2014. These results will be called to the ordering clinician or representative by the Radiologist Assistant, and communication documented in the PACS or zVision Dashboard.   Electronically Signed   By: Sherryl Barters M.D.   On: 05/21/2014 16:59   Ct Abdomen Pelvis W Contrast  05/18/2014   CLINICAL DATA:  Acute onset of right lower quadrant abdominal pain for 2 days. Bloody stools and abdominal tenderness.  EXAM: CT ABDOMEN AND PELVIS WITH CONTRAST  TECHNIQUE: Multidetector CT imaging of the abdomen and pelvis was performed using the standard protocol following bolus administration of intravenous contrast.  CONTRAST:  159mL OMNIPAQUE IOHEXOL 300 MG/ML  SOLN  COMPARISON:  CT of the abdomen and pelvis from 04/15/2014  FINDINGS: A small right pleural effusion is again noted. The centrally cavitary mass at the right lower lobe has increased mildly in size, now measuring approximately 4.3 cm, reflecting the patient's known lung adenocarcinoma. A trace left pleural effusion is noted.  There is increased moderate ascites noted about the liver and spleen, tracking along both paracolic gutters into the pelvis. There is mild new peripheral enhancement about the fluid in the pelvis. Though the fluid remains simple in attenuation, its attenuation is slightly higher than on the prior study, raising concern for mild underlying complexity. No focal abscess formation is yet seen.  There is significantly worsened mesenteric  edema and diffuse mucosal edema involving much of the jejunum and proximal ileum. The amount of mesenteric edema and wall thickening raises concern for early changes of bowel ischemia along much of the jejunum and proximal ileum. This corresponds to interval enlargement of the patient's underlying mesenteric masses, measuring up to 2.8 cm in size.  Along the proximal ileum, at the site of the previously noted diffuse tumor infiltration into the ileal wall, there now appears to be oral contrast extending across the wall of the small bowel, though still contained by the serosa. This is compatible with a focal contained perforation.  The clinically described bloody stools likely arise from tumor infiltration at the proximal sigmoid colon, where there is significantly worsened circumferential wall thickening. This is adjacent to the site of ileal infiltration.  Additional diffuse omental edema is seen, concerning for omental  metastases. There is vague hypodensity surrounding the stomach, which could reflect a small amount of underlying tumor and associated fluid.  Wall thickening is noted along the distal esophagus, perhaps mildly more apparent than on the prior study.  Scattered tiny hypodensities within the liver remain nonspecific. The spleen is unremarkable in appearance. The gallbladder is difficult to fully assess due to surrounding fluid, but appears grossly unremarkable. The pancreas and adrenal glands are unremarkable.  Scattered bilateral renal cysts are seen. The kidneys are otherwise unremarkable. Mild nonspecific perinephric stranding is noted bilaterally. There is no evidence of hydronephrosis. No renal or ureteral stones are seen.  Enlarged periaortic nodes measure up to 1.5 cm in short axis. No acute vascular abnormalities are seen. Scattered calcification is seen along the abdominal aorta and its branches.  The appendix is filled with contrast, but difficult to fully assess due to surrounding edema. The  remainder of the colon is filled with contrast, to the level of the infiltrating mass at the proximal sigmoid colon, and is grossly unremarkable.  The bladder is mildly distended and grossly unremarkable in appearance. The prostate remains normal in size, with scattered calcification. No inguinal lymphadenopathy is seen.  No acute osseous abnormalities are identified. Vacuum phenomenon is noted at L5-S1.  IMPRESSION: 1. Along the proximal ileum, at the site of previously noted diffuse tumor infiltration into the ileal wall, there is oral contrast extending across the wall of the small bowel, though still contained by the serosa. This is compatible with a focal contained perforation. 2. Significantly worsened mesenteric edema and diffuse mucosal edema involving much of the jejunum and proximal ileum. This raises question for early changes of bowel ischemia along the jejunum and proximal ileum, in conjunction with interval enlargement of underlying mesenteric masses, measuring up to 2.8 cm in size. 3. Clinically described bloody stools likely arise from tumor infiltration of the proximal sigmoid colon, with significantly worsened circumferential wall thickening. This is adjacent to the ileal infiltration. 4. Increased moderate ascites about the liver and spleen, tracking along both paracolic gutters into the pelvis. Mild new peripheral enhancement about the fluid in the pelvis. Though the fluid remains simple in attenuation, slightly increased attenuation raises concern for mild complexity. No focal abscess yet seen. 5. 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 Vague hypodensity surrounding the stomach could reflect a small amount of underlying tumor and associated fluid. Wall thickening at the distal esophagus is perhaps slightly more apparent than on the prior study. 6. Enlarged periaortic nodes measure up to 1.5 cm in short axis. 7. Small right pleural effusion again noted. Centrally cavitary mass at  the right lower lobe has increased mildly in size, now measuring 4.3 cm, reflecting the patient's known lung adenocarcinoma. Trace left pleural effusion noted. 8. Nonspecific tiny hypodensities within the liver.  These results were called by telephone at the time of interpretation on 05/18/2014 at 5:05 am to Dr. Sherrine Maples, who verbally acknowledged these results.   Electronically Signed   By: Garald Balding M.D.   On: 05/18/2014 05:30   Dg Abd Portable 1v  05/19/2014   CLINICAL DATA:  Abdominal distention with constipation.  EXAM: PORTABLE ABDOMEN - 1 VIEW  COMPARISON:  CT abdomen pelvis 05/18/2014.  FINDINGS: Oral contrast is seen in the appendix, colon and rectum. No small bowel dilatation. No unexpected radiopaque calculi.  IMPRESSION: Retained oral contrast within the colon and rectum. No acute findings. Please refer to cross-sectional imaging performed yesterday for further details and discussion.   Electronically  Signed   By: Lorin Picket M.D.   On: 05/19/2014 15:32   Admission HPI: Raymond Gregory is a 64 year old man with history of GERD, rectal bleeding, and lung adenocarcinoma with abdominal metastases presenting with diffuse abdominal pain that started yesterday. He describes his abdominal pain as sharp and persistent and has been getting worse today. The pain is worse with eating, so he has not eaten any food today. He tried ibuprofen and Percocet without relief. He denies nausea, vomiting, diarrhea or constipation. He does report some pain with urination this morning. His last bowel movement was yesterday and was light brown and normal consistency, but he reports having black tarry stool for the past couple of weeks. He has been following with his oncologist, Dr. Julien Nordmann, at the [facility] who has been working him up his black tarry stools and anemia. He was last seen on 05/14/14, and his Hgb was 7.7, so he was transfused two units and given hemocult cards to bring back to  clinic. He is currently receiving chemotherapy with Cyramza (ramucirumab) an antibody against VEGFR2, which was delayed during the workup. A soft tissue nodule abdominal nodule was noted on a follow up chest CT in August. Abdominal CT at that time demonstrated mesenteric lymph node metastases and circumferencial thickening of the small bowel consistent with tumor infiltration. He was seen in the ER on 04/15/14 with abdominal pain and CT at that time was unchanged.  Hospital Course by problem list: Principal Problem:   Small bowel perforation Active Problems:   Primary cancer of right lower lobe of lung   GI bleed   Acute bilateral lower abdominal pain   Metastasis from malignant tumor of lung   Metastatic Adenocarcinoma with Small Bowel Perforation: Patient presented with abdominal pain and distention. CT abdomen/pelvis on admission showed diffuse tumor infiltration of the proximal ileum, along w/ oral contrast extending across the wall of the small bowel, though still contained by the serosa, compatible with a focal contained perforation. 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 Surgery stated patient was a poor candidate for surgery and surgery would not improve his quality of life. Dr. Lindi Adie w/ medical oncology recommended medical management w/ IVF, bowel rest, and ABx as the best option at the time. Dr. Lindi Adie contacted Dr. Julien Nordmann, patient's medical oncologist, who saw the patient and agreed with plan. Patient was managed with IVF, IV antibiotics, Dilaudid 0.5 mg IV Q4H prn and Toradol 30 mg Q8H prn. Patient's abdominal pain was controlled during admission and patient continued to have bowel movements and tolerate liquid diet. Repeat CT  showed a new 6 cm collection of gas in the left abdomen which is contained but extraluminal probably related to small bowel perforation, less likely due to colon perforation. Increased ascites with some enhancement along portions of the ascites raising concern for tumor seeding or peritonitis. Increased mesenteric, omental, and subcutaneous edema compatible with third spacing of fluid. Proximal sigmoid colon wall thickening is striking and stable from 3 days ago possibly from tumor or bleeding into the bowel wall. Cavitary mass in the ileum with ulceration contain by the serosa. This was discussed with surgery, who had signed off as he is not a surgical candidate. They recommended palliative/hospice discussion, continuing IV antibiotics and pain control  and allowing patient to continue po as tolerated. Primary team had a palliative care discussion with patient and family. Patient was made DNR and agreed to home with home hospice. Plans were made for this and equipment set up at home. However, over the snow weekend and several long conversations, patient and family continued to be hesitant with plan and confused regarding prognosis. We consulted palliative care team for further family discussion regarding future plans. Palliative care met with patient and family and gave further explanations of palliative care and hospice. Patient and family felt ready and prepared to be discharged to home with home hospice on day of discharge. Patient was discharged with Moxifloxacin 400 mg daily, Dilaudid 2 mg po Q4H prn, and Roxanol 10-20 mg (0.5-1 mLs) Q2H prn (as recommended by palliative). Patient was instructed to use dilaudid primarily, but if pain was no longer controlled with dilaudid or unable to swallow pills, patient can use Roxanol. Patient was also discharged with Zofran and Protonix as noted in discharge medications. Hospice nurse will follow up with patient at home tomorrow.   Discharge Vitals:   BP 125/80 mmHg   Pulse 94  Temp(Src) 98.2 F (36.8 C) (Oral)  Resp 18  Ht $R'5\' 6"'LA$  (1.676 m)  Wt 66.679 kg (147 lb)  BMI 23.74 kg/m2  SpO2 100%  Signed: Osa Craver, DO PGY-1 Internal Medicine Resident Pager # [phone] 05/27/2014 7:22 PM

## 2014-05-27 NOTE — Discharge Instructions (Signed)
·   Thank you for allowing [place] to be involved in your healthcare while you were hospitalized at [facility].   Please note that there HAVE been changes to your home medications.  --> PLEASE LOOK AT YOUR DISCHARGE MEDICATION LIST FOR DETAILS.  Please call your PCP if you have any questions or concerns, or any difficulty getting any of your medications.  Please return to the ER if you have worsening of your symptoms or new severe symptoms arise.   Raymond Gregory, it has been an honor to take care of you. We have discharged you with pain medication, antibiotics, anti-nausea medication, and reflux medications. Home hospice will also prescribe your medications for you and can make changes as needed.  FOR PAIN: -YOU HAVE BEEN TAKING DILAUDID 2 MG EVERY 4 HOURS AS NEEDED FOR PAIN WHILE YOU WERE IN THE HOSPITAL  -YOU CAN CONTINUE TAKING THIS PAIN MEDICATION  IF YOUR PAIN IS NO LONGER CONTROLLED WITH THE DILAUDID THEN YOU CAN TAKE THE PAIN MEDICATION BELOW:  ROXANOL 10 MG (0.5 mLs) EVERY 2 HOURS AS NEEDED FOR SEVERE PAIN  PLEASE USE CAUTION AS THIS PAIN MEDICATION IS VERY STRONG

## 2014-05-28 ENCOUNTER — Ambulatory Visit: Payer: Medicare Other

## 2014-05-29 ENCOUNTER — Telehealth: Payer: Self-pay | Admitting: *Deleted

## 2014-05-29 NOTE — Telephone Encounter (Signed)
Wife called inquiring about status of FMLA forms.  Reports form was given to Dr. Marvel Plan in [facility] on 05-22-2014 or 05-23-2014.  Form was to be faxed to Dr. Julien Nordmann.  [employer] Notified and no fax received at this time.  Advised to bring new form from employer to [facility] with instructions.  Forms take 7 to 10 business days for completion.  She can pick up the form or we can fax to her or employer.  Raymond Gregory will bring form.

## 2014-05-30 ENCOUNTER — Encounter: Payer: Self-pay | Admitting: Internal Medicine

## 2014-05-30 NOTE — Progress Notes (Signed)
Put [employer] disability form on nurse's desk

## 2014-06-02 ENCOUNTER — Encounter: Payer: Self-pay | Admitting: Medical Oncology

## 2014-06-18 ENCOUNTER — Ambulatory Visit: Payer: Medicare Other

## 2014-06-20 ENCOUNTER — Telehealth: Payer: Self-pay | Admitting: Internal Medicine

## 2014-06-20 NOTE — Telephone Encounter (Signed)
pt called to sched flush....ok and aware of d.t...Marland Kitchenpt will come in and sched more appts

## 2014-06-23 ENCOUNTER — Telehealth: Payer: Self-pay | Admitting: Internal Medicine

## 2014-06-23 NOTE — Telephone Encounter (Signed)
pt called to advised he can not come to todays appt...he is requesting that his home health nurse do his flushes

## 2014-06-24 ENCOUNTER — Telehealth: Payer: Self-pay | Admitting: *Deleted

## 2014-06-24 NOTE — Telephone Encounter (Signed)
[facility] nurse called to say that Raymond Gregory was unable to make his appointment to the [facility] for a Port a Cath flush yesterday due to his declining condition.  She would like to flush his Port while in the home today.  Advise her that she may do so and she could flush it Q 6 weeks while under Hospice Care.

## 2014-07-01 ENCOUNTER — Telehealth: Payer: Self-pay | Admitting: *Deleted

## 2014-07-03 ENCOUNTER — Telehealth: Payer: Self-pay | Admitting: Internal Medicine

## 2014-07-03 NOTE — Telephone Encounter (Signed)
Received death certificate 08/06/19

## 2014-08-01 NOTE — Telephone Encounter (Signed)
VERBAL ORDER AND READ BACK TO DR.MOHAMED- DISCONTINUE AVELOX. NOTIFIED GINA.

## 2014-08-01 DEATH — deceased

## 2014-12-25 ENCOUNTER — Encounter: Payer: Self-pay | Admitting: Gastroenterology

## 2015-10-19 ENCOUNTER — Other Ambulatory Visit: Payer: Self-pay | Admitting: Nurse Practitioner

## 2016-05-22 IMAGING — CT CT ABD-PELV W/ CM
2 of 5 series · 16 of 46 positions shown, 18 images · IV contrast (OMNIPAQUE)
Comparison: CT thorax 12/09/2013, CT chest at and pelvis 05/27/2013

CLINICAL DATA: Lung cancer.  New abdominal soft tissue nodules.

EXAM:
CT ABDOMEN AND PELVIS WITH CONTRAST
TECHNIQUE: Multidetector CT imaging of the abdomen and pelvis was performed
using the standard protocol following bolus administration of
intravenous contrast.
CONTRAST:  100mL OMNIPAQUE IOHEXOL 300 MG/ML  SOLN

[Series 2: rtn a/p with · axial · 0.76mm/px · z∈[-401,-36]mm · 13 of 83 slices shown, 15 images]
[im 5/83  soft-tissue]
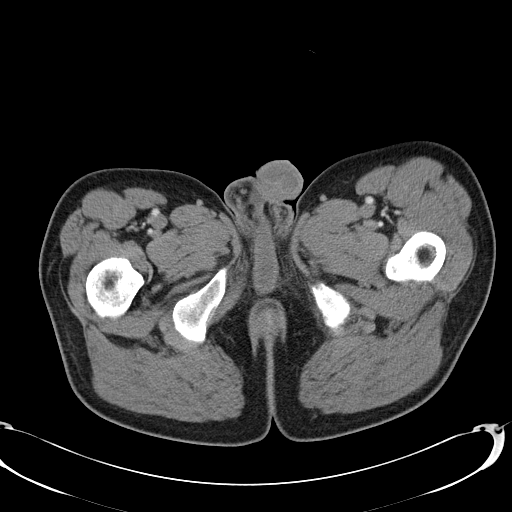
[im 5/83  bone]
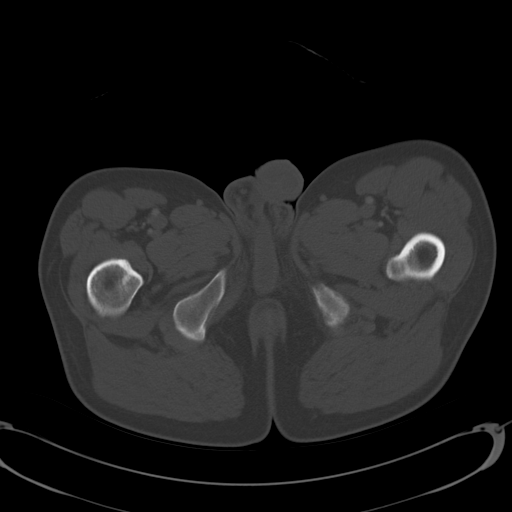
[im 13/83  soft-tissue]
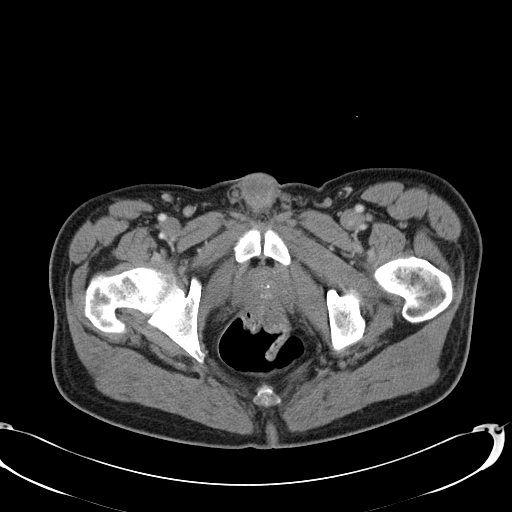
[im 18/83  soft-tissue]
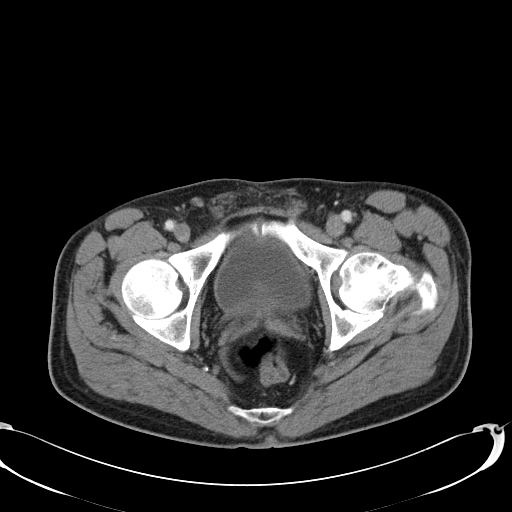
[im 22/83  soft-tissue]
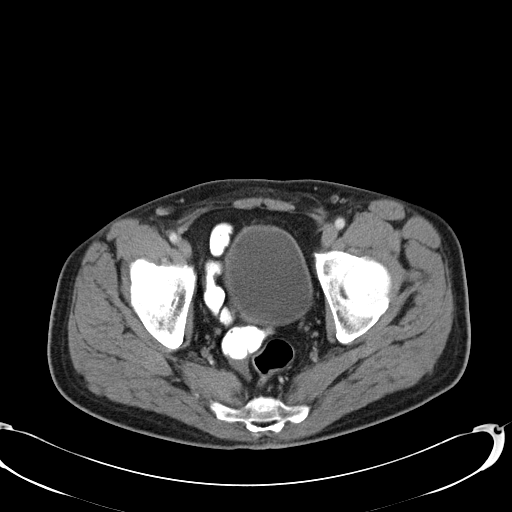
[im 31/83  soft-tissue]
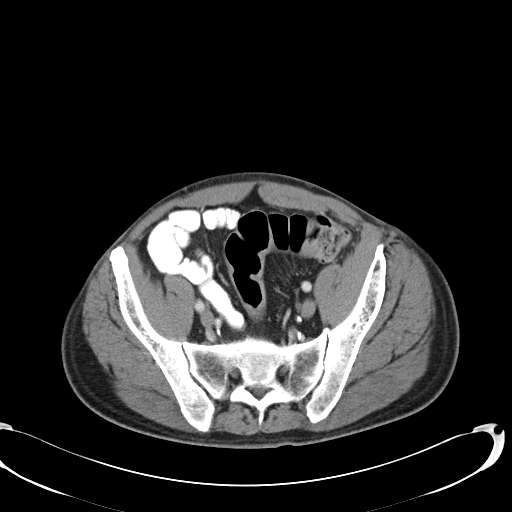
[im 35/83  soft-tissue]
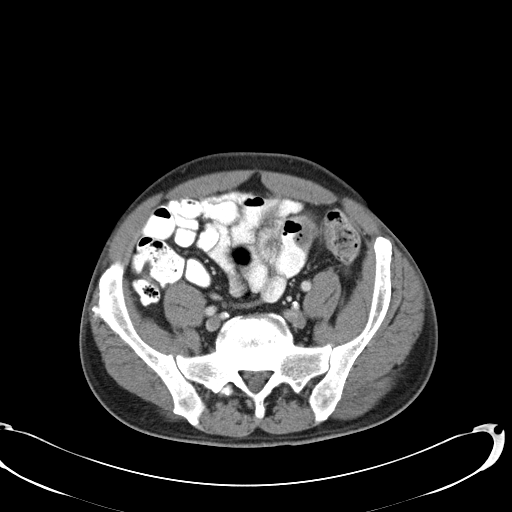
[im 44/83  soft-tissue]
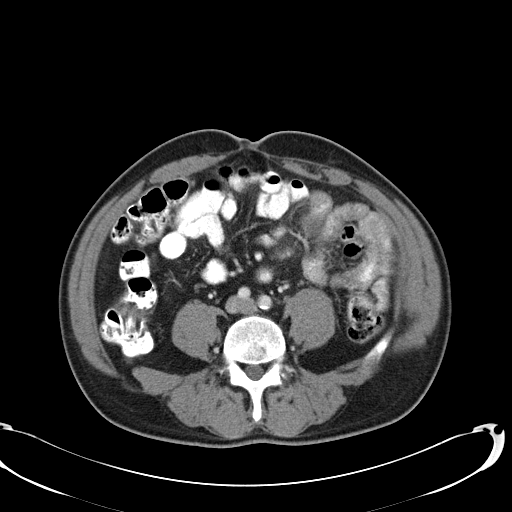
[im 48/83  soft-tissue]
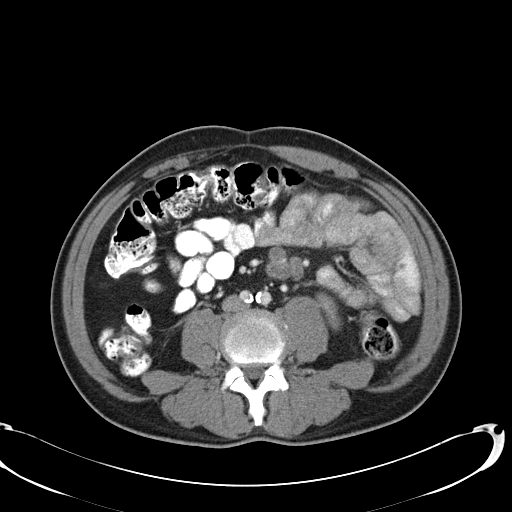
[im 52/83  soft-tissue]
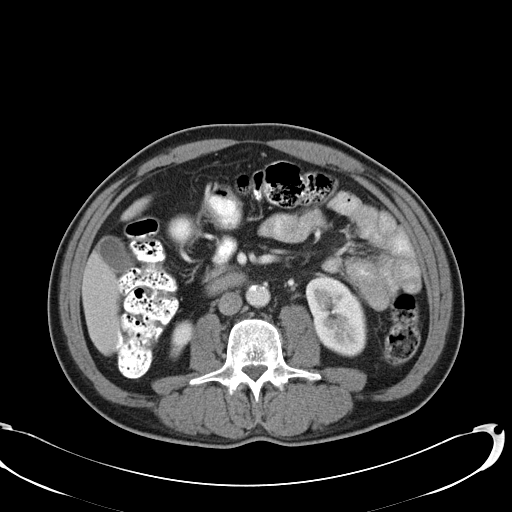
[im 52/83  bone]
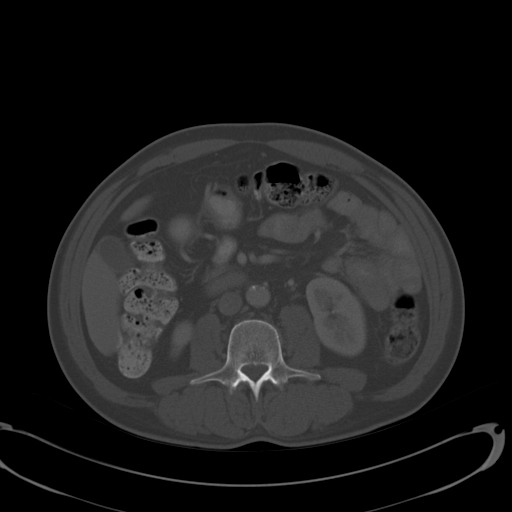
[im 61/83  soft-tissue]
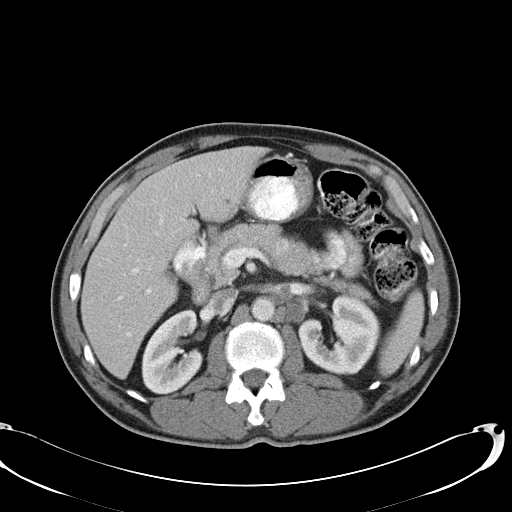
[im 65/83  soft-tissue]
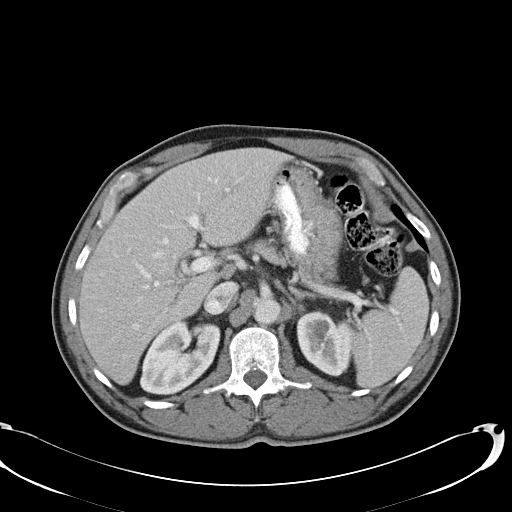
[im 70/83  soft-tissue]
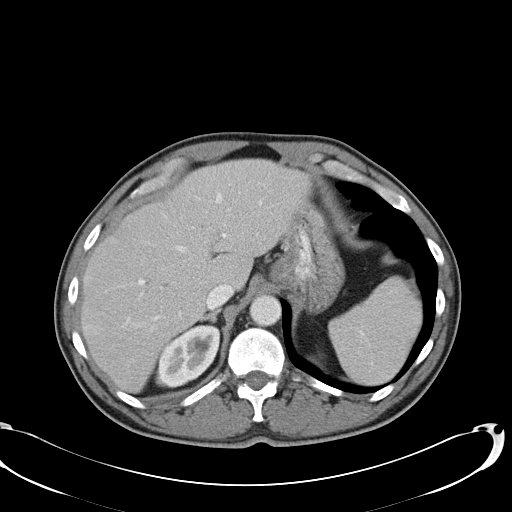
[im 78/83  soft-tissue]
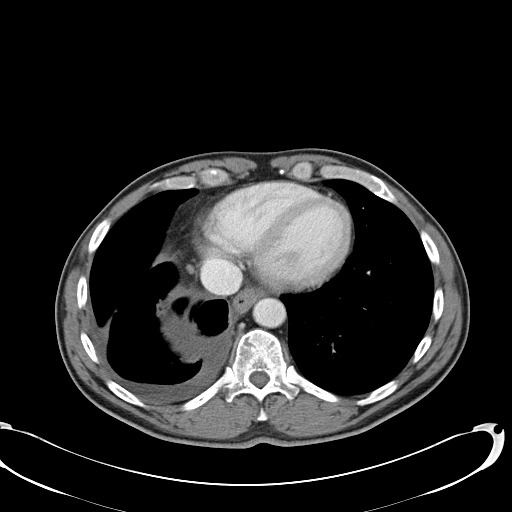

[Series 602: <mpr thick range> · coronal · 0.81mm/px · 3 of 113 slices shown]
[im 38/113  soft-tissue]
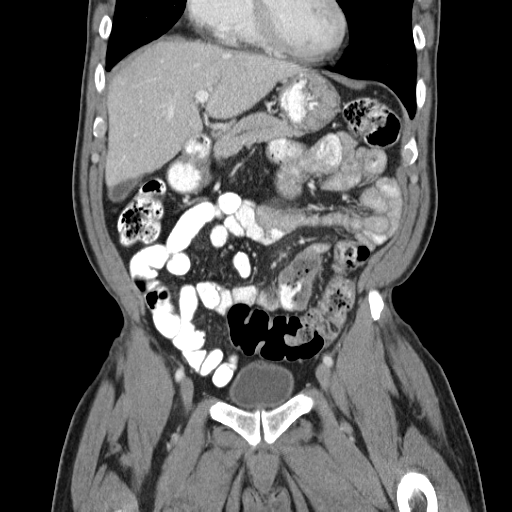
[im 50/113  soft-tissue]
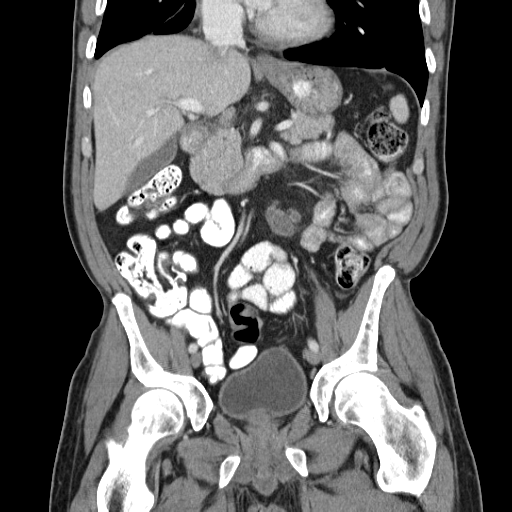
[im 63/113  soft-tissue]
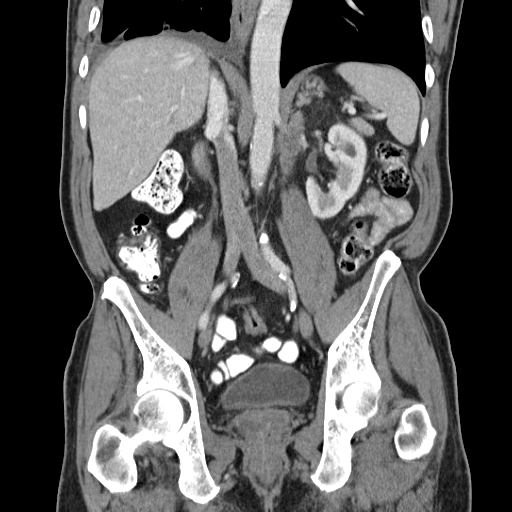

[16 of 46 positions shown; findings below may reference images not displayed]

FINDINGS: Again demonstrated 3.7 cm mass at the right lung base. Moderate
right effusion.

There are several low-density lesions in the superior aspect of the
liver measuring less than 5 mm (example image number 3) which are
not changed from prior. Gallbladder, spleen, adrenal glands, and
kidneys normal.

There are new retroperitoneal lymph nodes left of the aorta at the
level of the kidneys. These have central low attenuation suggesting
necrotic lymph nodes. Example node measures 14 mm on image 26,
series 2. There is similar lymph nodes within the central mesentery
measuring up to 2.6 cm (image number 37). These are new from prior
exam.

The stomach, duodenum are normal. There is abnormal thickening of a
loop of small bowel in the left lower quadrant. On coronal
projection, 5 cm segment of circumferential bowel wall thickening
involving the small bowel. The bowel wall is thickened up to 1 cm
(image 33, series 602). Abnormal small bowel bowel wall thickening
is adjacent to the necrotic mesenteric lymph nodes. No evidence of
high-grade obstruction as contrast flows into colon. There is stool
in the left colon and sigmoid colon.

Abdominal or is normal caliber.

In the pelvis, prostate gland and bladder normal. No pelvic
lymphadenopathy. No aggressive osseous lesion.
IMPRESSION: 1. Progression of left para-aortic necrotic lymph nodes as well as
central mesenteric lymph node metastasis.
2. Circumferential thickening of a loop of small bowel is consistent
with tumor infiltration. No evidence of high-grade obstruction but
this lesion is at risk for obstruction.
3. Again demonstrated right lower lobe mass.
These results will be called to the ordering clinician or
representative by the Radiologist Assistant, and communication
documented in the PACS or zVision Dashboard.
